# Patient Record
Sex: Female | Born: 1950
Health system: Southern US, Community
[De-identification: ages and names within clinical notes are randomized; demographics above are authoritative.]

## PROBLEM LIST (undated history)

## (undated) DIAGNOSIS — Z9889 Other specified postprocedural states: Secondary | ICD-10-CM

## (undated) DIAGNOSIS — M797 Fibromyalgia: Secondary | ICD-10-CM

## (undated) DIAGNOSIS — M069 Rheumatoid arthritis, unspecified: Secondary | ICD-10-CM

## (undated) DIAGNOSIS — D332 Benign neoplasm of brain, unspecified: Secondary | ICD-10-CM

## (undated) DIAGNOSIS — K859 Acute pancreatitis without necrosis or infection, unspecified: Secondary | ICD-10-CM

## (undated) DIAGNOSIS — A925 Zika virus disease: Secondary | ICD-10-CM

## (undated) DIAGNOSIS — G629 Polyneuropathy, unspecified: Secondary | ICD-10-CM

## (undated) DIAGNOSIS — R112 Nausea with vomiting, unspecified: Secondary | ICD-10-CM

## (undated) HISTORY — DX: Zika virus disease: A92.5

## (undated) HISTORY — PX: EYE SURGERY: SHX253

## (undated) HISTORY — DX: Fibromyalgia: M79.7

## (undated) HISTORY — DX: Acute pancreatitis without necrosis or infection, unspecified: K85.90

## (undated) HISTORY — DX: Benign neoplasm of brain, unspecified: D33.2

## (undated) HISTORY — PX: AUGMENTATION MAMMAPLASTY: SUR837

## (undated) HISTORY — PX: SPLENECTOMY: SUR1306

## (undated) HISTORY — PX: SIMPLE MASTECTOMY: SHX2409

## (undated) HISTORY — DX: Polyneuropathy, unspecified: G62.9

## (undated) HISTORY — PX: MASTECTOMY SUBCUTANEOUS: SUR853

## (undated) HISTORY — PX: TUBAL LIGATION: SHX77

## (undated) HISTORY — PX: OTHER SURGICAL HISTORY: SHX169

---
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
## 1998-08-12 ENCOUNTER — Other Ambulatory Visit: Admission: RE | Admit: 1998-08-12 | Discharge: 1998-08-12 | Payer: Self-pay | Admitting: *Deleted

## 1999-12-03 ENCOUNTER — Other Ambulatory Visit: Admission: RE | Admit: 1999-12-03 | Discharge: 1999-12-03 | Payer: Self-pay | Admitting: *Deleted

## 2001-05-25 ENCOUNTER — Other Ambulatory Visit: Admission: RE | Admit: 2001-05-25 | Discharge: 2001-05-25 | Payer: Self-pay | Admitting: *Deleted

## 2002-08-23 ENCOUNTER — Other Ambulatory Visit: Admission: RE | Admit: 2002-08-23 | Discharge: 2002-08-23 | Payer: Self-pay | Admitting: Obstetrics and Gynecology

## 2003-10-01 ENCOUNTER — Other Ambulatory Visit: Admission: RE | Admit: 2003-10-01 | Discharge: 2003-10-01 | Payer: Self-pay | Admitting: Obstetrics and Gynecology

## 2003-12-26 ENCOUNTER — Ambulatory Visit (HOSPITAL_COMMUNITY): Admission: RE | Admit: 2003-12-26 | Discharge: 2003-12-26 | Payer: Self-pay | Admitting: Pulmonary Disease

## 2004-10-01 ENCOUNTER — Other Ambulatory Visit: Admission: RE | Admit: 2004-10-01 | Discharge: 2004-10-01 | Payer: Self-pay | Admitting: Obstetrics and Gynecology

## 2005-10-05 ENCOUNTER — Other Ambulatory Visit: Admission: RE | Admit: 2005-10-05 | Discharge: 2005-10-05 | Payer: Self-pay | Admitting: Obstetrics & Gynecology

## 2005-12-17 LAB — HM COLONOSCOPY

## 2007-01-06 ENCOUNTER — Other Ambulatory Visit: Admission: RE | Admit: 2007-01-06 | Discharge: 2007-01-06 | Payer: Self-pay | Admitting: Obstetrics & Gynecology

## 2007-09-18 HISTORY — PX: OTHER SURGICAL HISTORY: SHX169

## 2008-02-15 ENCOUNTER — Other Ambulatory Visit: Admission: RE | Admit: 2008-02-15 | Discharge: 2008-02-15 | Payer: Self-pay | Admitting: Obstetrics & Gynecology

## 2008-10-28 ENCOUNTER — Ambulatory Visit (HOSPITAL_COMMUNITY): Admission: RE | Admit: 2008-10-28 | Discharge: 2008-10-28 | Payer: Self-pay | Admitting: Pulmonary Disease

## 2008-11-27 ENCOUNTER — Ambulatory Visit: Payer: Self-pay | Admitting: Vascular Surgery

## 2009-02-27 ENCOUNTER — Ambulatory Visit: Payer: Self-pay | Admitting: Vascular Surgery

## 2010-05-10 ENCOUNTER — Encounter: Payer: Self-pay | Admitting: Pulmonary Disease

## 2010-09-01 NOTE — Consult Note (Signed)
NEW PATIENT CONSULTATION   Whitney Moreno, Whitney Moreno  DOB:  April 14, 1951                                       11/27/2008  CHART#:[id number]   The patient presents today for discussion regarding pain and swelling in  both lower extremities.  She is a very pleasant 60 year old female who  works as a Runner, broadcasting/film/video and reports severe pain in both legs.  This begins in  her distal thighs extending through her calves and down on to her feet.  She reports this is quite painful at the end of the day.  She does not  have any history of deep venous thrombosis.  She does have significant  spider vein telangiectasia over both lower extremities.  She does not  have any varicose veins.   PAST MEDICAL HISTORY:  Negative for any cardiac or major medical  difficulty.  She does not have diabetes, is not hypertensive.   SOCIAL HISTORY:  She is married with two children.  She works as a  Runner, broadcasting/film/video.  She quit smoking greater than 20 years ago.  Does not drink  alcohol.   REVIEW OF SYSTEMS:  Her weight is reported at 170 pounds.  She is 5 feet  7 inches tall.  She denies cardiac, pulmonary, GI or GU difficulties.  She does have arthritic joint pain.   PHYSICAL EXAMINATION:  A well-developed, well-nourished black female  appearing stated age of 34.  Her blood pressure 124/82, heart rate 72,  respirations 18.  Her dorsalis pedis pulses are 2+ bilaterally.  She  does have moderate swelling from her knees distally bilaterally.  Does  not have any skin changes related to venous hypertension.  She does have  telangiectasia on both calves and both thighs.   She underwent noninvasive vascular laboratory studies in our office and  this reveals normal deep system with no evidence of reflux and normal  great and small saphenous veins with no evidence of clot or reflux.  I  discussed this at length with the patient.  I explained that she does  not have any evidence of significant arterial or venous pathology.   I  explained that I am unable to determine the cause of her leg pain.  She  is concerned regarding the appearance of the telangiectasia and reported  successful treatment with sclerotherapy approximately 15 years ago.  I  did explain the option of sclerotherapy for improvement of the  appearance but I explained that I do not feel that we would make any  headway regarding her pain since these do not appear to be related to  the telangiectasia.  She was given information regarding out of pocket  expense and expectations if she proceeds with sclerotherapy and will  notify us if she wishes to proceed.   Larina Earthly, M.D.  Electronically Signed   TFE/MEDQ  D:  11/27/2008  T:  11/28/2008  Job:  [id number]   cc:   Ramon Dredge L. Juanetta Gosling, M.D.

## 2010-09-01 NOTE — Procedures (Signed)
LOWER EXTREMITY VENOUS REFLUX EXAM   INDICATION:  Bilateral lower extremity varicose veins.   EXAM:  Using color-flow imaging and pulse Doppler spectral analysis, the  right and left common femoral, superficial femoral, popliteal, posterior  tibial, greater and lesser saphenous veins are evaluated.  There is no  evidence suggesting deep venous insufficiency in the right or left lower  extremity.   The right and left saphenofemoral junction is competent.  The right and  left GSV is competent with the caliber as described below.   The right and left proximal short saphenous veins demonstrate  competency.   GSV Diameter (used if found to be incompetent only)                                            Right    Left  Proximal Greater Saphenous Vein           cm       cm  Proximal-to-mid-thigh                     cm       cm  Mid thigh                                 cm       cm  Mid-distal thigh                          cm       cm  Distal thigh                              cm       cm  Knee                                      cm       cm   IMPRESSION:  1. The right and left greater saphenous veins demonstrate competency.  2. The right and left greater saphenous veins are not aneurysmal.  3. The right and left greater saphenous veins are not tortuous.  4. The deep venous system is competent.  5. The right and left lesser saphenous veins are competent.  6. There is no evidence of deep or superficial venous thrombosis.   ___________________________________________  Larina Earthly, M.D.   AC/MEDQ  D:  11/27/2008  T:  11/27/2008  Job:  [id number]

## 2010-11-02 ENCOUNTER — Ambulatory Visit (HOSPITAL_COMMUNITY)
Admission: RE | Admit: 2010-11-02 | Discharge: 2010-11-02 | Disposition: A | Discharge status: Home / Self Care | Payer: BC Managed Care – PPO | Source: Ambulatory Visit | Attending: Pulmonary Disease | Admitting: Pulmonary Disease

## 2010-11-02 ENCOUNTER — Other Ambulatory Visit (HOSPITAL_COMMUNITY): Payer: Self-pay | Admitting: Pulmonary Disease

## 2010-11-02 DIAGNOSIS — R52 Pain, unspecified: Secondary | ICD-10-CM

## 2010-11-02 DIAGNOSIS — M25569 Pain in unspecified knee: Secondary | ICD-10-CM | POA: Insufficient documentation

## 2010-11-26 ENCOUNTER — Ambulatory Visit (INDEPENDENT_AMBULATORY_CARE_PROVIDER_SITE_OTHER): Payer: BC Managed Care – PPO | Admitting: Orthopedic Surgery

## 2010-11-26 ENCOUNTER — Encounter: Payer: Self-pay | Admitting: Orthopedic Surgery

## 2010-11-26 VITALS — Resp 18 | Ht 67.0 in | Wt 178.0 lb

## 2010-11-26 DIAGNOSIS — M1711 Unilateral primary osteoarthritis, right knee: Secondary | ICD-10-CM | POA: Insufficient documentation

## 2010-11-26 DIAGNOSIS — IMO0002 Reserved for concepts with insufficient information to code with codable children: Secondary | ICD-10-CM

## 2010-11-26 DIAGNOSIS — M1710 Unilateral primary osteoarthritis, unspecified knee: Secondary | ICD-10-CM

## 2010-11-26 MED ORDER — DICLOFENAC POTASSIUM 50 MG PO TABS: 50.0000 mg | ORAL_TABLET | Freq: Two times a day (BID) | ORAL | Status: DC

## 2010-11-26 NOTE — Progress Notes (Signed)
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  The pain started gradually she's only been treated with over-the-counter medications Tylenol and ibuprofen but continues with throbbing and aching knee pain which is worse after climbing and after being active.  There are times when the knees feel tight she can't bend her meds are for her to walk  Visit history of fibromyalgia.  She's had eye surgery and a full mastectomy.  She reports a review of systems upper extremity RIGHT hand tingling no shortness of breath chest pain heartburn her   Physical Exam(12) GENERAL: normal development   CDV: pulses are normal   Skin: normal  Lymph: nodes were not palpable/normal  Psychiatric: awake, alert and oriented  Neuro: normal sensation  MSK She ambulates normally without assistive device.  Bilateral knees are examined.  Inspection normal leg lengths.  Medial and lateral joint line tenderness and patellofemoral crepitance. Range of motion is normal up to 115 and then no more motion is obtainable. Manual muscle testing strength is 5/5. Knee is stable bilaterally. McMurray sign is negative.  Hip range of motion is normal without pain  Assessment: Osteoarthritis both knees with primarily patellofemoral joint symptoms.  Plan: Recommend anti-inflammatories, weight loss, physical therapy for strengthening and anti-inflammatories.  Followup 2 months  X-rays have been reviewed, they were done at [facility] and the reports are included with the conclusion of osteoarthritis bilateral knees

## 2010-11-26 NOTE — Patient Instructions (Signed)
Home exericise program   Weight loss  Start arthritis medication

## 2010-11-27 ENCOUNTER — Ambulatory Visit (HOSPITAL_COMMUNITY)
Admission: RE | Admit: 2010-11-27 | Discharge: 2010-11-27 | Disposition: A | Discharge status: Home / Self Care | Payer: BC Managed Care – PPO | Source: Ambulatory Visit | Attending: Orthopedic Surgery | Admitting: Orthopedic Surgery

## 2010-11-27 DIAGNOSIS — M25669 Stiffness of unspecified knee, not elsewhere classified: Secondary | ICD-10-CM | POA: Insufficient documentation

## 2010-11-27 DIAGNOSIS — M25569 Pain in unspecified knee: Secondary | ICD-10-CM | POA: Insufficient documentation

## 2010-11-27 DIAGNOSIS — IMO0001 Reserved for inherently not codable concepts without codable children: Secondary | ICD-10-CM | POA: Insufficient documentation

## 2010-11-27 DIAGNOSIS — M6281 Muscle weakness (generalized): Secondary | ICD-10-CM | POA: Insufficient documentation

## 2010-11-27 NOTE — Patient Instructions (Addendum)
HEP

## 2010-11-27 NOTE — Progress Notes (Signed)
Physical Therapy Evaluation  Patient Name: KAMEKO HUKILL [id number] Date: 11/27/2010 HPI: B arthritis with decreased ROM and strength. Symptoms/Limitations Symptoms: Pt states that she has had arthritis for awhile but she has noticed increased pain over the past summer.  Pt states that if she is going up or down steps she has significant increased pain.  She states that she is having to lift her legs up with her hands getting in and out of the car about 30% of the time.  The patient states if she is sitting in a low chair she has significant pain coming from sit to stand.  The patient statees if she is weight-bearing more than 15 minutes she has increased pain.  She states she has noticed increased swelling.  The patient has used over the counter pain meds which gives her relief but the pain comes right back. Limitations: Sitting;Standing;Walking;House hold activities How long can you sit comfortably?: If pt sits down for 30 minutes it is difficult for her to stand up.  The longer she sits the more difficult it is to get up. How long can you stand comfortably?: Pain starts after 15 minutes and progresses.  The pt is a Runner, broadcasting/film/video and just continues on. How long can you walk comfortably?: as above. Pain Assessment Currently in Pain?: Yes Pain Score: 10-Worst pain ever (at the worst; least 0 ) Pain Location: Knee Pain Orientation: Left Pain Type: Chronic pain Pain Onset: More than a month ago Pain Frequency: Intermittent Pain Relieving Factors: over the counter pain meds Multiple Pain Sites: Yes Past Medical History:  Past Medical History  Diagnosis Date  . Fibromyalgia    Past Surgical History:  Past Surgical History  Procedure Date  . Eye surgery   . Full mastectomy     Precautions/Restrictions     Prior Functioning  Home Living Type of Home: House Lives With: Spouse Home Layout: Two level Alternate Level Stairs-Rails: Right Alternate Level Stairs-Number of Steps: 12 Home Access:  Stairs to enter Entrance Stairs-Rails: Right Entrance Stairs-Number of Steps: 3 Prior Function Level of Independence: Independent with basic ADLs Driving: Yes Able to Take Stairs Reciprically: Yes Vocation: Full time employment Printmaker) Leisure: Hobbies-yes (Comment) Comments: yard  Cognition Cognition Overall Cognitive Status: Appears within functional limits for tasks assessed  Sensation/Coordination/Flexibility  Tight hamstrings B  Assessment RLE AROM (degrees) Right Knee Extension 0-130: 3  Right Knee Flexion 0-140: 120  RLE Strength Right Hip Flexion: 5/5 Right Hip Extension: 3/5 (5-) Right Hip ABduction: 5/5 (5-) Right Hip ADduction: 5/5 Right Knee Flexion:  (5-) Right Knee Extension: 5/5 LLE AROM (degrees) Left Knee Extension 0-130: 4  Left Knee Flexion 0-140: 112  LLE Strength Left Hip Flexion: 4/5 Left Hip Extension: 3/5 Left Hip ABduction: 4/5 Left Hip ADduction:  (4-) Left Knee Flexion: 5/5 Left Knee Extension: 4/5  Mobility (including Balance)     Exercise/Treatments Knee Stretches Active Hamstring Stretch: 3 reps;30 seconds Knee Exercises Quad Sets: 10 reps Knee Flexion: Left;10 reps (prone) Straight Leg Raises: 10 reps Heel Slides: 10 reps;Supine Hip Extension: Left (prone) Hip ABduction: 10 reps;Sidelying Hip ADduction: 10 reps;Sidelying Additional Knee Exercises Lateral Step Up: Step Height: 4";10 reps Functional Squat: 10 reps SLS: 2x    Goals PT Short Term Goals Short Term Goal 1: I HEP 1 treatment only. End of Session Patient Active Problem List  Diagnoses  . OA (osteoarthritis) of knee  . Muscle weakness (generalized)  . Stiffness of joint, not elsewhere classified, lower leg  PT - End of Session Activity Tolerance: Patient tolerated treatment well General Behavior During Session: [facility] for tasks performed Cognition: [facility] for tasks performed PT Assessment and Plan Clinical Impression Statement: Pt with decreased ROM and  strength who benefitted from a one time HEP  PT Treatment/Interventions: Therapeutic exercise PT Plan: D/C pt to call if she has any questions on her HEP.  Time Calculation Start Time: 1610 Stop Time: 0954 Time Calculation (min): 62 min  RUSSELL,CINDY 11/27/2010, 10:32 AM

## 2010-12-10 ENCOUNTER — Telehealth: Payer: Self-pay | Admitting: Orthopedic Surgery

## 2010-12-10 DIAGNOSIS — M1710 Unilateral primary osteoarthritis, unspecified knee: Secondary | ICD-10-CM

## 2010-12-10 MED ORDER — DICLOFENAC POTASSIUM 50 MG PO TABS: 50.0000 mg | ORAL_TABLET | Freq: Two times a day (BID) | ORAL | Status: AC

## 2010-12-10 NOTE — Telephone Encounter (Signed)
refaxed medication.  

## 2010-12-10 NOTE — Telephone Encounter (Signed)
Please fax the prescription from last office visit to [facility] in [place]

## 2011-08-09 LAB — HM PAP SMEAR: HM Pap smear: NEGATIVE

## 2012-04-20 LAB — HM MAMMOGRAPHY: HM Mammogram: NORMAL

## 2012-09-06 ENCOUNTER — Ambulatory Visit: Payer: Self-pay | Admitting: Obstetrics & Gynecology

## 2012-09-07 ENCOUNTER — Encounter: Payer: Self-pay | Admitting: Obstetrics & Gynecology

## 2012-09-08 ENCOUNTER — Ambulatory Visit: Payer: Self-pay | Admitting: Obstetrics & Gynecology

## 2012-09-08 ENCOUNTER — Encounter: Payer: Self-pay | Admitting: Obstetrics & Gynecology

## 2012-09-08 DIAGNOSIS — Z01419 Encounter for gynecological examination (general) (routine) without abnormal findings: Secondary | ICD-10-CM

## 2012-09-21 ENCOUNTER — Encounter (HOSPITAL_COMMUNITY): Payer: Self-pay | Admitting: Emergency Medicine

## 2012-09-21 ENCOUNTER — Emergency Department (HOSPITAL_COMMUNITY): Payer: BC Managed Care – PPO

## 2012-09-21 ENCOUNTER — Emergency Department (HOSPITAL_COMMUNITY)
Admission: EM | Admit: 2012-09-21 | Discharge: 2012-09-21 | Disposition: A | Discharge status: Home / Self Care | Payer: BC Managed Care – PPO | Attending: Emergency Medicine | Admitting: Emergency Medicine

## 2012-09-21 DIAGNOSIS — M1710 Unilateral primary osteoarthritis, unspecified knee: Secondary | ICD-10-CM | POA: Insufficient documentation

## 2012-09-21 DIAGNOSIS — Z8669 Personal history of other diseases of the nervous system and sense organs: Secondary | ICD-10-CM | POA: Insufficient documentation

## 2012-09-21 DIAGNOSIS — IMO0001 Reserved for inherently not codable concepts without codable children: Secondary | ICD-10-CM | POA: Insufficient documentation

## 2012-09-21 DIAGNOSIS — M1712 Unilateral primary osteoarthritis, left knee: Secondary | ICD-10-CM

## 2012-09-21 DIAGNOSIS — M069 Rheumatoid arthritis, unspecified: Secondary | ICD-10-CM | POA: Insufficient documentation

## 2012-09-21 DIAGNOSIS — IMO0002 Reserved for concepts with insufficient information to code with codable children: Secondary | ICD-10-CM | POA: Insufficient documentation

## 2012-09-21 DIAGNOSIS — Z79899 Other long term (current) drug therapy: Secondary | ICD-10-CM | POA: Insufficient documentation

## 2012-09-21 DIAGNOSIS — Z87891 Personal history of nicotine dependence: Secondary | ICD-10-CM | POA: Insufficient documentation

## 2012-09-21 HISTORY — DX: Rheumatoid arthritis, unspecified: M06.9

## 2012-09-21 MED ORDER — HYDROCODONE-ACETAMINOPHEN 5-325 MG PO TABS: 1.0000 | ORAL_TABLET | ORAL | Status: DC | PRN

## 2012-09-21 NOTE — ED Notes (Signed)
Patient with no complaints at this time. Respirations even and unlabored. Skin warm/dry. Discharge instructions reviewed with patient at this time. Patient given opportunity to voice concerns/ask questions. Patient discharged at this time and left Emergency Department with steady gait.   

## 2012-09-21 NOTE — ED Notes (Signed)
Patient c/o left knee pain x3-4 days. Per p[atient no known injury. Per patient has had this type of pain in 2 years ago which required physical therapy. Patient reports taking 2 extra strength advil with no relief.

## 2012-09-23 NOTE — ED Provider Notes (Signed)
History     CSN: [id number]  Arrival date & time 09/21/12  0800   First MD Initiated Contact with Patient 09/21/12 [phone]      Chief Complaint  Patient presents with  . Knee Pain    (Consider location/radiation/quality/duration/timing/severity/associated sxs/prior treatment) HPI Comments: Whitney Moreno is a 62 y.o. Female presenting with a four-day history of  left knee pain which is constant, aching in quality and worsened with range of motion and weightbearing.  She has a history of previous intermittent problems with this knee, most recently requiring physical therapy about 2 years ago which did relieve her symptoms.  She does have a history of rheumatoid arthritis for which she takes methotrexate.  She denies injury to her knee and denies redness, rash, increased swelling of the joint although reports chronic mild swelling at baseline which is unchanged.  She has taken Advil with no relief of symptoms.  The history is provided by the patient.    Past Medical History  Diagnosis Date  . Fibromyalgia   . Brain tumor (benign)   . RA (rheumatoid arthritis)     Past Surgical History  Procedure Laterality Date  . Eye surgery      with prosthetic eye  . Splenectomy    . Mastectomy, partial    . Tubal ligation      Family History  Problem Relation Age of Onset  . Heart disease    . Cancer      History  Substance Use Topics  . Smoking status: Former Smoker    Types: Cigarettes  . Smokeless tobacco: Never Used  . Alcohol Use: Yes     Comment: socially    OB History   Grav Para Term Preterm Abortions TAB SAB Ect Mult Living   2 2 2       2       Review of Systems  Constitutional: Negative for fever.  Musculoskeletal: Positive for arthralgias. Negative for myalgias and joint swelling.  Neurological: Negative for weakness and numbness.    Allergies  Review of patient's allergies indicates no known allergies.  Home Medications   Current Outpatient Rx  Name   Route  Sig  Dispense  Refill  . calcium carbonate 200 MG capsule   Oral   Take 250 mg by mouth daily.           . CYMBALTA 60 MG capsule               . glucosamine-chondroitin 500-400 MG tablet   Oral   Take 1 tablet by mouth 3 (three) times daily.           . Ibuprofen (ADVIL PO)   Oral   Take 2 tablets by mouth daily as needed (pain).         . methotrexate (RHEUMATREX) 2.5 MG tablet   Oral   Take 10 mg by mouth once a week. Caution:Chemotherapy. Protect from light.         . Multiple Vitamin (MULTIVITAMIN PO)   Oral   Take by mouth.           Marland Kitchen HYDROcodone-acetaminophen (NORCO/VICODIN) 5-325 MG per tablet   Oral   Take 1 tablet by mouth every 4 (four) hours as needed for pain.   15 tablet   0     BP 112/75  Pulse 76  Temp(Src) 98.1 F (36.7 C) (Oral)  Resp 16  Ht 5\' 6"  (1.676 m)  Wt 180 lb (81.647 kg)  BMI  29.07 kg/m2  SpO2 96%  Physical Exam  Constitutional: She appears well-developed and well-nourished.  HENT:  Head: Atraumatic.  Neck: Normal range of motion.  Cardiovascular:  Pulses equal bilaterally  Musculoskeletal: She exhibits tenderness.       Left knee: She exhibits swelling. She exhibits normal range of motion, no deformity, no erythema, normal alignment and normal meniscus. Tenderness found. Medial joint line and lateral joint line tenderness noted.  Mild crepitus with range of motion.  No rash, lesions or ecchymosis.  Her dorsalis pedis pulses are intact.  Patient displays ankle flexion and extension without pain.  Neurological: She is alert. She has normal strength. She displays normal reflexes. No sensory deficit.  Equal strength  Skin: Skin is warm and dry.  Psychiatric: She has a normal mood and affect.    ED Course  Procedures (including critical care time)  Labs Reviewed - No data to display No results found.   1. Osteoarthritis of left knee       MDM  Patient was prescribed hydrocodone.  Encouraged rest,  minimizing weight bearing, heating pad.  Discussed x-ray findings with patient and encouraged followup with Dr. Romeo Apple as soon as possible for further management of her pain.  Discussed hurts severe arthritis changes, and possible need for surgical intervention at some point in time, which she states Dr. Romeo Apple has mentioned to her previously.  The patient appears reasonably screened and/or stabilized for discharge and I doubt any other medical condition or other [facility] requiring further screening, evaluation, or treatment in the ED at this time prior to discharge.         Burgess Amor, PA-C 09/23/12 1550

## 2012-09-26 NOTE — ED Provider Notes (Signed)
Medical screening examination/treatment/procedure(s) were performed by non-physician practitioner and as supervising physician I was immediately available for consultation/collaboration. Devoria Albe, MD, Armando Gang   Ward Givens, MD 09/26/12 [phone]

## 2012-09-28 ENCOUNTER — Ambulatory Visit: Payer: Self-pay | Admitting: Obstetrics & Gynecology

## 2012-10-04 ENCOUNTER — Encounter: Payer: Self-pay | Admitting: *Deleted

## 2012-10-09 ENCOUNTER — Ambulatory Visit: Payer: BC Managed Care – PPO | Admitting: Orthopedic Surgery

## 2012-10-10 ENCOUNTER — Ambulatory Visit (INDEPENDENT_AMBULATORY_CARE_PROVIDER_SITE_OTHER): Payer: BC Managed Care – PPO | Admitting: Obstetrics & Gynecology

## 2012-10-10 ENCOUNTER — Encounter: Payer: Self-pay | Admitting: Obstetrics & Gynecology

## 2012-10-10 VITALS — BP 104/64 | HR 70 | Resp 16 | Ht 66.5 in | Wt 179.0 lb

## 2012-10-10 DIAGNOSIS — Z01419 Encounter for gynecological examination (general) (routine) without abnormal findings: Secondary | ICD-10-CM

## 2012-10-10 DIAGNOSIS — Z0000 Encounter for general adult medical examination without abnormal findings: Secondary | ICD-10-CM

## 2012-10-10 LAB — POCT URINALYSIS DIPSTICK
Bilirubin, UA: NEGATIVE
Blood, UA: NEGATIVE
Glucose, UA: NEGATIVE
Ketones, UA: NEGATIVE
Leukocytes, UA: NEGATIVE
Nitrite, UA: NEGATIVE
Protein, UA: NEGATIVE
pH, UA: 6

## 2012-10-10 LAB — LIPID PANEL
Cholesterol: 238 mg/dL — ABNORMAL HIGH (ref 0–200)
HDL: 68 mg/dL (ref >39)
LDL Cholesterol: 142 mg/dL — ABNORMAL HIGH (ref 0–99)
Total CHOL/HDL Ratio: 3.5 Ratio
Triglycerides: 140 mg/dL (ref <150)
VLDL: 28 mg/dL (ref 0–40)

## 2012-10-10 LAB — HEMOGLOBIN, FINGERSTICK: Hemoglobin, fingerstick: 12.7 g/dL (ref 12.0–16.0)

## 2012-10-10 NOTE — Patient Instructions (Signed)

## 2012-10-10 NOTE — Progress Notes (Signed)
62 y.o. [id number] MarriedHispanicF here for annual exam.  Having a lot more trouble with RA this year especially in her knees.  Had a cortisone injection yesterday.  Her rheumatologist has been adjusting her medications.  She is going to see her mother and stay for the summer.  Family is in [place].  Husband diagnosed with myasthenia gravis this past year.   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  She thinks she can make it until then.    No LMP recorded. Patient is postmenopausal.          Sexually active: no  The current method of family planning is none.    Exercising: no  The patient does not participate in regular exercise at present. Smoker:  no  Health Maintenance: Pap:  08/09/11 normal, neg HPV History of abnormal Pap:  no MMG:  04/20/12 Colonoscopy:  8/07 normal 10 year recheck BMD:   08/07, 1.6/-0.4 TDaP:  Unsure  Screening Labs: yesterday, Hb today: 12.7, Urine today: normal    reports that she has quit smoking. Her smoking use included Cigarettes. She smoked 0.00 packs per day. She has never used smokeless tobacco. She reports that  drinks alcohol. She reports that she does not use illicit drugs.  Past Medical History  Diagnosis Date  . Fibromyalgia   . Brain tumor (benign)   . RA (rheumatoid arthritis)     Past Surgical History  Procedure Laterality Date  . Eye surgery      with prosthetic eye  . Splenectomy    . Mastectomy, partial    . Tubal ligation      Current Outpatient Prescriptions  Medication Sig Dispense Refill  . calcium carbonate 200 MG capsule Take 250 mg by mouth daily.        . CYMBALTA 60 MG capsule       . folic acid (FOLVITE) 1 MG tablet       . glucosamine-chondroitin 500-400 MG tablet Take 1 tablet by mouth 3 (three) times daily.        Marland Kitchen HYDROcodone-acetaminophen (NORCO/VICODIN) 5-325 MG per tablet Take 1 tablet by mouth every 4 (four) hours as needed for pain.  15 tablet  0  . Ibuprofen (ADVIL PO) Take 2 tablets  by mouth daily as needed (pain).      . methotrexate (RHEUMATREX) 2.5 MG tablet Take 10 mg by mouth once a week. Caution:Chemotherapy. Protect from light.      . Multiple Vitamin (MULTIVITAMIN PO) Take by mouth.         No current facility-administered medications for this visit.    Family History  Problem Relation Age of Onset  . Heart disease    . Cancer    . Osteoarthritis Mother   . Heart disease Father   . Hypertension Father   . Heart failure Maternal Uncle   . Heart failure Cousin     ROS:  Pertinent items are noted in HPI.  Otherwise, a comprehensive ROS was negative.  Exam:   BP 104/64  Pulse 70  Resp 16  Ht 5' 6.5" (1.689 m)  Wt 179 lb (81.194 kg)  BMI 28.46 kg/m2  Weight change: -2lbs  Height: 5' 6.5" (168.9 cm)  Ht Readings from Last 3 Encounters:  10/10/12 5' 6.5" (1.689 m)  09/21/12 5\' 6"  (1.676 m)  11/26/10 5\' 7"  (1.702 m)    General appearance: alert, cooperative and appears stated age Head: Normocephalic, without obvious abnormality, atraumatic Neck: no  adenopathy, supple, symmetrical, trachea midline and thyroid normal to inspection and palpation Lungs: clear to auscultation bilaterally Breasts: normal appearance, no masses or tenderness, bilateral implants present Heart: regular rate and rhythm Abdomen: soft, non-tender; bowel sounds normal; no masses,  no organomegaly Extremities: extremities normal, atraumatic, no cyanosis or edema Skin: Skin color, texture, turgor normal. No rashes or lesions Lymph nodes: Cervical, supraclavicular, and axillary nodes normal. No abnormal inguinal nodes palpated Neurologic: Grossly normal   Pelvic: External genitalia:  no lesions              Urethra:  normal appearing urethra with no masses, tenderness or lesions              Bartholins and Skenes: normal                 Vagina: normal appearing vagina with normal color and discharge, no lesions              Cervix: no lesions              Pap taken:  no Bimanual Exam:  Uterus:  normal size, contour, position, consistency, mobility, non-tender              Adnexa: normal adnexa and no mass, fullness, tenderness               Rectovaginal: Confirms               Anus:  normal sphincter tone, no lesions  A:  Well Woman with normal exam PMP, no HRT Fibromyalgia RA  P:   Mammogram yearly.  pap smear with HR HPV 1 year ago.  No pap today. Lipids today. return annually or prn  An After Visit Summary was printed and given to the patient.

## 2012-10-16 ENCOUNTER — Telehealth: Payer: Self-pay

## 2012-10-16 NOTE — Telephone Encounter (Signed)
Message copied by Elisha Headland on Mon Oct 16, 2012  1:05 PM ------      Message from: Jerene Bears      Created: Fri Oct 13, 2012  4:29 PM       Inform lipids stable.  LDLs elevated but HDLs good.  TG's good.  Repeat 1-2 yrs ------

## 2012-10-16 NOTE — Telephone Encounter (Signed)
6/30 lmtcb//kn

## 2013-02-22 ENCOUNTER — Other Ambulatory Visit: Payer: Self-pay

## 2013-03-22 ENCOUNTER — Telehealth: Payer: Self-pay | Admitting: Obstetrics & Gynecology

## 2013-03-22 NOTE — Telephone Encounter (Signed)
Routing to Kelly.  ?

## 2013-03-22 NOTE — Telephone Encounter (Signed)
Returning a call to Kelly °

## 2013-03-22 NOTE — Telephone Encounter (Signed)
Pt says she is returning kelly's call. No phone call in system

## 2013-03-23 NOTE — Telephone Encounter (Signed)
Message left to return call to Secretary at [phone].   I can give lab results when patient returns call today.

## 2013-03-23 NOTE — Telephone Encounter (Signed)
Returning call. Pt is aware kelly is not in today.

## 2013-03-23 NOTE — Telephone Encounter (Signed)
Spoke with patinet. Message given.

## 2013-03-30 NOTE — Progress Notes (Signed)
Quick Note:  Patient notified by T. Fast, RN. ______

## 2013-03-30 NOTE — Telephone Encounter (Signed)
Patient notified of lab results by T Fast, RN

## 2013-05-09 ENCOUNTER — Encounter (HOSPITAL_COMMUNITY): Payer: Self-pay | Admitting: Emergency Medicine

## 2013-05-09 ENCOUNTER — Emergency Department (HOSPITAL_COMMUNITY)
Admission: EM | Admit: 2013-05-09 | Discharge: 2013-05-09 | Discharge status: Against Medical Advice | Payer: BC Managed Care – PPO | Attending: Emergency Medicine | Admitting: Emergency Medicine

## 2013-05-09 DIAGNOSIS — Y929 Unspecified place or not applicable: Secondary | ICD-10-CM | POA: Insufficient documentation

## 2013-05-09 DIAGNOSIS — S0993XA Unspecified injury of face, initial encounter: Secondary | ICD-10-CM | POA: Insufficient documentation

## 2013-05-09 DIAGNOSIS — Y939 Activity, unspecified: Secondary | ICD-10-CM | POA: Insufficient documentation

## 2013-05-09 DIAGNOSIS — W2209XA Striking against other stationary object, initial encounter: Secondary | ICD-10-CM | POA: Insufficient documentation

## 2013-05-09 DIAGNOSIS — S199XXA Unspecified injury of neck, initial encounter: Principal | ICD-10-CM

## 2013-05-09 NOTE — ED Notes (Signed)
Patient c/o nosebleed x 20 minutes.  Patient hit her face on sliding on glass door.

## 2013-05-09 NOTE — ED Notes (Signed)
Pt 's husband asking how longer it was going to be, delay explained to husband, staff informed that pt and husband left without being seen

## 2013-06-06 ENCOUNTER — Ambulatory Visit (HOSPITAL_COMMUNITY)
Admission: RE | Admit: 2013-06-06 | Discharge: 2013-06-06 | Disposition: A | Discharge status: Home / Self Care | Payer: BC Managed Care – PPO | Source: Ambulatory Visit | Attending: Pulmonary Disease | Admitting: Pulmonary Disease

## 2013-06-06 ENCOUNTER — Other Ambulatory Visit (HOSPITAL_COMMUNITY): Payer: Self-pay | Admitting: Pulmonary Disease

## 2013-06-06 DIAGNOSIS — Z87891 Personal history of nicotine dependence: Secondary | ICD-10-CM | POA: Insufficient documentation

## 2013-06-06 DIAGNOSIS — M25519 Pain in unspecified shoulder: Secondary | ICD-10-CM | POA: Insufficient documentation

## 2013-06-06 DIAGNOSIS — M25511 Pain in right shoulder: Secondary | ICD-10-CM

## 2013-06-06 DIAGNOSIS — R079 Chest pain, unspecified: Secondary | ICD-10-CM

## 2013-08-02 DIAGNOSIS — M797 Fibromyalgia: Secondary | ICD-10-CM | POA: Insufficient documentation

## 2013-08-02 DIAGNOSIS — M791 Myalgia, unspecified site: Secondary | ICD-10-CM | POA: Insufficient documentation

## 2013-08-02 DIAGNOSIS — M069 Rheumatoid arthritis, unspecified: Secondary | ICD-10-CM | POA: Insufficient documentation

## 2013-08-12 ENCOUNTER — Inpatient Hospital Stay (HOSPITAL_COMMUNITY): Payer: BC Managed Care – PPO

## 2013-08-12 ENCOUNTER — Inpatient Hospital Stay (HOSPITAL_COMMUNITY)
Admission: EM | Admit: 2013-08-12 | Discharge: 2013-08-14 | DRG: 419 | Disposition: A | Discharge status: Home / Self Care | Payer: BC Managed Care – PPO | Attending: Pulmonary Disease | Admitting: Pulmonary Disease

## 2013-08-12 ENCOUNTER — Emergency Department (HOSPITAL_COMMUNITY): Payer: BC Managed Care – PPO

## 2013-08-12 ENCOUNTER — Encounter (HOSPITAL_COMMUNITY): Payer: Self-pay | Admitting: Emergency Medicine

## 2013-08-12 DIAGNOSIS — K8000 Calculus of gallbladder with acute cholecystitis without obstruction: Principal | ICD-10-CM | POA: Diagnosis present

## 2013-08-12 DIAGNOSIS — IMO0001 Reserved for inherently not codable concepts without codable children: Secondary | ICD-10-CM | POA: Diagnosis present

## 2013-08-12 DIAGNOSIS — M1710 Unilateral primary osteoarthritis, unspecified knee: Secondary | ICD-10-CM | POA: Diagnosis present

## 2013-08-12 DIAGNOSIS — R7989 Other specified abnormal findings of blood chemistry: Secondary | ICD-10-CM

## 2013-08-12 DIAGNOSIS — Z970 Presence of artificial eye: Secondary | ICD-10-CM

## 2013-08-12 DIAGNOSIS — K810 Acute cholecystitis: Secondary | ICD-10-CM | POA: Diagnosis present

## 2013-08-12 DIAGNOSIS — Z87891 Personal history of nicotine dependence: Secondary | ICD-10-CM

## 2013-08-12 DIAGNOSIS — M069 Rheumatoid arthritis, unspecified: Secondary | ICD-10-CM | POA: Diagnosis present

## 2013-08-12 DIAGNOSIS — M179 Osteoarthritis of knee, unspecified: Secondary | ICD-10-CM | POA: Diagnosis present

## 2013-08-12 DIAGNOSIS — Z8249 Family history of ischemic heart disease and other diseases of the circulatory system: Secondary | ICD-10-CM

## 2013-08-12 DIAGNOSIS — M1711 Unilateral primary osteoarthritis, right knee: Secondary | ICD-10-CM | POA: Diagnosis present

## 2013-08-12 DIAGNOSIS — K819 Cholecystitis, unspecified: Secondary | ICD-10-CM | POA: Diagnosis present

## 2013-08-12 DIAGNOSIS — R945 Abnormal results of liver function studies: Secondary | ICD-10-CM

## 2013-08-12 DIAGNOSIS — K5900 Constipation, unspecified: Secondary | ICD-10-CM

## 2013-08-12 LAB — URINALYSIS, ROUTINE W REFLEX MICROSCOPIC
Bilirubin Urine: NEGATIVE
Glucose, UA: NEGATIVE mg/dL
Hgb urine dipstick: NEGATIVE
Ketones, ur: NEGATIVE mg/dL
Leukocytes, UA: NEGATIVE
Nitrite: NEGATIVE
Protein, ur: NEGATIVE mg/dL
Specific Gravity, Urine: 1.015 (ref 1.005–1.030)
Urobilinogen, UA: 4 mg/dL — ABNORMAL HIGH (ref 0.0–1.0)
pH: 7 (ref 5.0–8.0)

## 2013-08-12 LAB — CBC WITH DIFFERENTIAL/PLATELET
Basophils Absolute: 0 10*3/uL (ref 0.0–0.1)
Basophils Relative: 0 % (ref 0–1)
Eosinophils Absolute: 0.3 10*3/uL (ref 0.0–0.7)
Eosinophils Relative: 3 % (ref 0–5)
HCT: 35.6 % — ABNORMAL LOW (ref 36.0–46.0)
Hemoglobin: 11.8 g/dL — ABNORMAL LOW (ref 12.0–15.0)
Lymphocytes Relative: 20 % (ref 12–46)
Lymphs Abs: 1.6 10*3/uL (ref 0.7–4.0)
MCH: 29.6 pg (ref 26.0–34.0)
MCHC: 33.1 g/dL (ref 30.0–36.0)
MCV: 89.4 fL (ref 78.0–100.0)
Monocytes Absolute: 0.9 10*3/uL (ref 0.1–1.0)
Monocytes Relative: 11 % (ref 3–12)
Neutro Abs: 5.2 10*3/uL (ref 1.7–7.7)
Neutrophils Relative %: 66 % (ref 43–77)
Platelets: 335 10*3/uL (ref 150–400)
RBC: 3.98 MIL/uL (ref 3.87–5.11)
RDW: 14.8 % (ref 11.5–15.5)
WBC: 7.9 10*3/uL (ref 4.0–10.5)

## 2013-08-12 LAB — COMPREHENSIVE METABOLIC PANEL
ALT: 560 U/L — ABNORMAL HIGH (ref 0–35)
AST: 812 U/L — ABNORMAL HIGH (ref 0–37)
Albumin: 3.5 g/dL (ref 3.5–5.2)
Alkaline Phosphatase: 131 U/L — ABNORMAL HIGH (ref 39–117)
BUN: 13 mg/dL (ref 6–23)
CO2: 26 mEq/L (ref 19–32)
Calcium: 9.2 mg/dL (ref 8.4–10.5)
Chloride: 103 mEq/L (ref 96–112)
Creatinine, Ser: 0.57 mg/dL (ref 0.50–1.10)
GFR calc Af Amer: >90 mL/min (ref >90)
GFR calc non Af Amer: >90 mL/min (ref >90)
Glucose, Bld: 118 mg/dL — ABNORMAL HIGH (ref 70–99)
Potassium: 4.1 mEq/L (ref 3.7–5.3)
Sodium: 141 mEq/L (ref 137–147)
Total Bilirubin: 1.2 mg/dL (ref 0.3–1.2)
Total Protein: 7.1 g/dL (ref 6.0–8.3)

## 2013-08-12 LAB — LIPASE, BLOOD: Lipase: 30 U/L (ref 11–59)

## 2013-08-12 LAB — SURGICAL PCR SCREEN
MRSA, PCR: NEGATIVE
Staphylococcus aureus: NEGATIVE

## 2013-08-12 MED ORDER — SODIUM CHLORIDE 0.9 % IV SOLN
1.5000 g | Freq: Four times a day (QID) | INTRAVENOUS | Status: DC
  Administered 2013-08-12 – 2013-08-14 (×8): 1.5 g via INTRAVENOUS
  Filled 2013-08-12 (×10): qty 1.5

## 2013-08-12 MED ORDER — SODIUM CHLORIDE 0.9 % IV SOLN: INTRAVENOUS | Status: DC

## 2013-08-12 MED ORDER — DOCUSATE SODIUM 100 MG PO CAPS
100.0000 mg | ORAL_CAPSULE | Freq: Two times a day (BID) | ORAL | Status: DC
  Administered 2013-08-12 – 2013-08-13 (×3): 100 mg via ORAL
  Filled 2013-08-12 (×3): qty 1

## 2013-08-12 MED ORDER — SORBITOL 70 % SOLN
30.0000 mL | Status: AC
  Administered 2013-08-12: 30 mL via ORAL
  Filled 2013-08-12: qty 30

## 2013-08-12 MED ORDER — IOHEXOL 300 MG/ML  SOLN
50.0000 mL | Freq: Once | INTRAMUSCULAR | Status: AC | PRN
  Administered 2013-08-12: 50 mL via ORAL

## 2013-08-12 MED ORDER — PIPERACILLIN-TAZOBACTAM 3.375 G IVPB 30 MIN
3.3750 g | Freq: Once | INTRAVENOUS | Status: AC
  Administered 2013-08-12: 3.375 g via INTRAVENOUS
  Filled 2013-08-12: qty 50

## 2013-08-12 MED ORDER — MORPHINE SULFATE 4 MG/ML IJ SOLN
4.0000 mg | Freq: Once | INTRAMUSCULAR | Status: AC
  Administered 2013-08-12: 4 mg via INTRAVENOUS
  Filled 2013-08-12: qty 1

## 2013-08-12 MED ORDER — MORPHINE SULFATE 2 MG/ML IJ SOLN: 2.0000 mg | INTRAMUSCULAR | Status: DC | PRN

## 2013-08-12 MED ORDER — LUBIPROSTONE 24 MCG PO CAPS: 24.0000 ug | ORAL_CAPSULE | Freq: Two times a day (BID) | ORAL | Status: DC

## 2013-08-12 MED ORDER — HEPARIN SODIUM (PORCINE) 5000 UNIT/ML IJ SOLN
5000.0000 [IU] | Freq: Three times a day (TID) | INTRAMUSCULAR | Status: DC
  Administered 2013-08-12 (×2): 5000 [IU] via SUBCUTANEOUS
  Filled 2013-08-12 (×3): qty 1

## 2013-08-12 MED ORDER — ONDANSETRON HCL 4 MG/2ML IJ SOLN
4.0000 mg | Freq: Once | INTRAMUSCULAR | Status: AC
  Administered 2013-08-12: 4 mg via INTRAVENOUS
  Filled 2013-08-12: qty 2

## 2013-08-12 MED ORDER — CHLORHEXIDINE GLUCONATE 4 % EX LIQD
1.0000 "application " | Freq: Once | CUTANEOUS | Status: AC
  Administered 2013-08-12: 1 via TOPICAL
  Filled 2013-08-12: qty 15

## 2013-08-12 MED ORDER — SODIUM CHLORIDE 0.9 % IV BOLUS (SEPSIS)
1000.0000 mL | Freq: Once | INTRAVENOUS | Status: AC
  Administered 2013-08-12: 1000 mL via INTRAVENOUS

## 2013-08-12 MED ORDER — DULOXETINE HCL 60 MG PO CPEP
60.0000 mg | ORAL_CAPSULE | Freq: Every day | ORAL | Status: DC
  Administered 2013-08-12 – 2013-08-13 (×2): 60 mg via ORAL
  Filled 2013-08-12 (×2): qty 1

## 2013-08-12 MED ORDER — SORBITOL 70 % SOLN
960.0000 mL | TOPICAL_OIL | Freq: Once | ORAL | Status: AC
  Administered 2013-08-12: 960 mL via RECTAL
  Filled 2013-08-12: qty 240

## 2013-08-12 MED ORDER — SODIUM CHLORIDE 0.9 % IV SOLN
INTRAVENOUS | Status: DC
  Administered 2013-08-12: 23:00:00 via INTRAVENOUS

## 2013-08-12 MED ORDER — POLYETHYLENE GLYCOL 3350 17 G PO PACK: 17.0000 g | PACK | Freq: Every day | ORAL | Status: DC | PRN

## 2013-08-12 MED ORDER — IBUPROFEN 800 MG PO TABS
400.0000 mg | ORAL_TABLET | ORAL | Status: DC | PRN
  Administered 2013-08-12 – 2013-08-13 (×2): 400 mg via ORAL
  Filled 2013-08-12 (×2): qty 1

## 2013-08-12 MED ORDER — IOHEXOL 300 MG/ML  SOLN
100.0000 mL | Freq: Once | INTRAMUSCULAR | Status: AC | PRN
  Administered 2013-08-12: 100 mL via INTRAVENOUS

## 2013-08-12 MED ORDER — BISACODYL 10 MG RE SUPP: 10.0000 mg | Freq: Every day | RECTAL | Status: DC | PRN
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
## 2013-08-12 NOTE — ED Notes (Signed)
Pt reports RUQ abdominal pain radiating to right flank. Pt reports nausea and urinary frequency. Pt denies vomiting diarrhea.
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
## 2013-08-12 NOTE — ED Notes (Signed)
MD at bedside. 
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
## 2013-08-12 NOTE — Progress Notes (Signed)
ANTIBIOTIC CONSULT NOTE - INITIAL  Pharmacy Consult for Unasyn Indication: suspected intra-abdominal infection  No Known Allergies  Patient Measurements: Height: 5\' 7"  (170.2 cm) Weight: 190 lb (86.183 kg) IBW/kg (Calculated) : 61.6  Vital Signs: Temp: 98.1 F (36.7 C) (04/26 1229) Temp src: Oral (04/26 0735) BP: 126/80 mmHg (04/26 1229) Pulse Rate: 68 (04/26 1229) Intake/Output from previous day:   Intake/Output from this shift:    Labs:  Recent Labs  08/12/13 0806  WBC 7.9  HGB 11.8*  PLT 335  CREATININE 0.57   Estimated Creatinine Clearance: 84.3 ml/min (by C-G formula based on Cr of 0.57). No results found for this basename: VANCOTROUGH, VANCOPEAK, VANCORANDOM, GENTTROUGH, GENTPEAK, GENTRANDOM, TOBRATROUGH, TOBRAPEAK, TOBRARND, AMIKACINPEAK, AMIKACINTROU, AMIKACIN,  in the last 72 hours   Microbiology: No results found for this or any previous visit (from the past 720 hour(s)).  Medical History: Past Medical History  Diagnosis Date  . Fibromyalgia   . Brain tumor (benign)   . RA (rheumatoid arthritis)    Medications:  Scheduled:  . ampicillin-sulbactam (UNASYN) IV  1.5 g Intravenous Q6H  . docusate sodium  100 mg Oral BID  . DULoxetine  60 mg Oral Daily  . heparin  5,000 Units Subcutaneous 3 times per day  . sorbitol  30 mL Oral NOW  . sorbitol, milk of mag, mineral oil, glycerin (SMOG) enema  960 mL Rectal Once   Assessment: 63yo female admitted with RUQ pain and suspected intra-abdominal infection.  Pt has good renal fxn.    Unasyn 4/26 >> Zosyn 4/26 x 1 dose  Goal of Therapy:  Eradicate infection.  Plan: Unasyn 1.5gm IV q6hrs Monitor labs, renal fxn and progress  Marykay Mccleod A Ahlijah Raia 08/12/2013,12:52 PM

## 2013-08-13 ENCOUNTER — Encounter (HOSPITAL_COMMUNITY): Payer: Self-pay | Admitting: *Deleted

## 2013-08-13 ENCOUNTER — Encounter (HOSPITAL_COMMUNITY): Payer: BC Managed Care – PPO | Admitting: Anesthesiology

## 2013-08-13 ENCOUNTER — Encounter (HOSPITAL_COMMUNITY)
Admission: EM | Disposition: A | Discharge status: Home / Self Care | Payer: Self-pay | Source: Home / Self Care | Attending: Pulmonary Disease

## 2013-08-13 ENCOUNTER — Inpatient Hospital Stay (HOSPITAL_COMMUNITY): Payer: BC Managed Care – PPO | Admitting: Anesthesiology

## 2013-08-13 HISTORY — PX: CHOLECYSTECTOMY: SHX55

## 2013-08-13 LAB — COMPREHENSIVE METABOLIC PANEL
ALT: 425 U/L — ABNORMAL HIGH (ref 0–35)
AST: 280 U/L — ABNORMAL HIGH (ref 0–37)
Albumin: 3.1 g/dL — ABNORMAL LOW (ref 3.5–5.2)
Alkaline Phosphatase: 118 U/L — ABNORMAL HIGH (ref 39–117)
BUN: 9 mg/dL (ref 6–23)
CO2: 26 mEq/L (ref 19–32)
Calcium: 8.4 mg/dL (ref 8.4–10.5)
Chloride: 108 mEq/L (ref 96–112)
Creatinine, Ser: 0.61 mg/dL (ref 0.50–1.10)
GFR calc Af Amer: >90 mL/min (ref >90)
GFR calc non Af Amer: >90 mL/min (ref >90)
Glucose, Bld: 97 mg/dL (ref 70–99)
Potassium: 3.9 mEq/L (ref 3.7–5.3)
Sodium: 144 mEq/L (ref 137–147)
Total Bilirubin: 0.4 mg/dL (ref 0.3–1.2)
Total Protein: 6.2 g/dL (ref 6.0–8.3)

## 2013-08-13 LAB — CBC
HCT: 33.1 % — ABNORMAL LOW (ref 36.0–46.0)
Hemoglobin: 10.8 g/dL — ABNORMAL LOW (ref 12.0–15.0)
MCH: 29.4 pg (ref 26.0–34.0)
MCHC: 32.6 g/dL (ref 30.0–36.0)
MCV: 90.2 fL (ref 78.0–100.0)
Platelets: 322 10*3/uL (ref 150–400)
RBC: 3.67 MIL/uL — ABNORMAL LOW (ref 3.87–5.11)
RDW: 15 % (ref 11.5–15.5)
WBC: 7.9 10*3/uL (ref 4.0–10.5)

## 2013-08-13 SURGERY — LAPAROSCOPIC CHOLECYSTECTOMY
Anesthesia: General | Site: Abdomen

## 2013-08-13 MED ORDER — FENTANYL CITRATE 0.05 MG/ML IJ SOLN
25.0000 ug | INTRAMUSCULAR | Status: DC | PRN
  Administered 2013-08-13 (×2): 50 ug via INTRAVENOUS
  Filled 2013-08-13: qty 2

## 2013-08-13 MED ORDER — PROPOFOL 10 MG/ML IV BOLUS
INTRAVENOUS | Status: DC | PRN
  Administered 2013-08-13: 120 mg via INTRAVENOUS

## 2013-08-13 MED ORDER — SUCCINYLCHOLINE CHLORIDE 20 MG/ML IJ SOLN
INTRAMUSCULAR | Status: AC
  Filled 2013-08-13: qty 1

## 2013-08-13 MED ORDER — GLYCOPYRROLATE 0.2 MG/ML IJ SOLN
0.2000 mg | Freq: Once | INTRAMUSCULAR | Status: AC
  Administered 2013-08-13: 0.2 mg via INTRAVENOUS

## 2013-08-13 MED ORDER — KETOROLAC TROMETHAMINE 30 MG/ML IJ SOLN
30.0000 mg | Freq: Once | INTRAMUSCULAR | Status: AC
  Administered 2013-08-13: 30 mg via INTRAVENOUS
  Filled 2013-08-13: qty 1

## 2013-08-13 MED ORDER — DEXAMETHASONE SODIUM PHOSPHATE 4 MG/ML IJ SOLN
4.0000 mg | Freq: Once | INTRAMUSCULAR | Status: AC
  Administered 2013-08-13: 4 mg via INTRAVENOUS

## 2013-08-13 MED ORDER — FENTANYL CITRATE 0.05 MG/ML IJ SOLN
INTRAMUSCULAR | Status: AC
  Filled 2013-08-13: qty 2

## 2013-08-13 MED ORDER — FENTANYL CITRATE 0.05 MG/ML IJ SOLN
INTRAMUSCULAR | Status: DC | PRN
  Administered 2013-08-13 (×7): 50 ug via INTRAVENOUS

## 2013-08-13 MED ORDER — NEOSTIGMINE METHYLSULFATE 1 MG/ML IJ SOLN
INTRAMUSCULAR | Status: DC | PRN
  Administered 2013-08-13: 4 mg via INTRAVENOUS

## 2013-08-13 MED ORDER — ROCURONIUM BROMIDE 100 MG/10ML IV SOLN
INTRAVENOUS | Status: DC | PRN
  Administered 2013-08-13: 5 mg via INTRAVENOUS

## 2013-08-13 MED ORDER — HYDROCODONE-ACETAMINOPHEN 5-325 MG PO TABS
1.0000 | ORAL_TABLET | ORAL | Status: DC | PRN
  Administered 2013-08-14: 2 via ORAL
  Filled 2013-08-13: qty 2

## 2013-08-13 MED ORDER — LIDOCAINE HCL 1 % IJ SOLN
INTRAMUSCULAR | Status: DC | PRN
  Administered 2013-08-13: 30 mg via INTRADERMAL

## 2013-08-13 MED ORDER — ONDANSETRON HCL 4 MG/2ML IJ SOLN
INTRAMUSCULAR | Status: AC
  Filled 2013-08-13: qty 2

## 2013-08-13 MED ORDER — ONDANSETRON HCL 4 MG/2ML IJ SOLN: 4.0000 mg | Freq: Four times a day (QID) | INTRAMUSCULAR | Status: DC | PRN

## 2013-08-13 MED ORDER — LACTATED RINGERS IV SOLN
INTRAVENOUS | Status: DC
  Administered 2013-08-13 (×2): via INTRAVENOUS

## 2013-08-13 MED ORDER — MIDAZOLAM HCL 2 MG/2ML IJ SOLN
1.0000 mg | INTRAMUSCULAR | Status: DC | PRN
  Administered 2013-08-13: 1 mg via INTRAVENOUS

## 2013-08-13 MED ORDER — PROPOFOL 10 MG/ML IV BOLUS
INTRAVENOUS | Status: AC
  Filled 2013-08-13: qty 20

## 2013-08-13 MED ORDER — BUPIVACAINE HCL (PF) 0.5 % IJ SOLN
INTRAMUSCULAR | Status: AC
  Filled 2013-08-13: qty 30

## 2013-08-13 MED ORDER — HEMOSTATIC AGENTS (NO CHARGE) OPTIME
TOPICAL | Status: DC | PRN
  Administered 2013-08-13: 1 via TOPICAL

## 2013-08-13 MED ORDER — MORPHINE SULFATE 2 MG/ML IJ SOLN: 2.0000 mg | INTRAMUSCULAR | Status: DC | PRN

## 2013-08-13 MED ORDER — ROCURONIUM BROMIDE 50 MG/5ML IV SOLN
INTRAVENOUS | Status: AC
  Filled 2013-08-13: qty 1

## 2013-08-13 MED ORDER — FENTANYL CITRATE 0.05 MG/ML IJ SOLN
INTRAMUSCULAR | Status: AC
  Filled 2013-08-13: qty 5

## 2013-08-13 MED ORDER — SODIUM CHLORIDE 0.9 % IR SOLN
Status: DC | PRN
  Administered 2013-08-13: 1000 mL

## 2013-08-13 MED ORDER — ONDANSETRON HCL 4 MG/2ML IJ SOLN: 4.0000 mg | Freq: Once | INTRAMUSCULAR | Status: DC | PRN

## 2013-08-13 MED ORDER — BUPIVACAINE HCL (PF) 0.5 % IJ SOLN
INTRAMUSCULAR | Status: DC | PRN
  Administered 2013-08-13: 10 mL

## 2013-08-13 MED ORDER — MIDAZOLAM HCL 2 MG/2ML IJ SOLN
INTRAMUSCULAR | Status: AC
  Filled 2013-08-13: qty 2

## 2013-08-13 MED ORDER — GLYCOPYRROLATE 0.2 MG/ML IJ SOLN
INTRAMUSCULAR | Status: AC
  Filled 2013-08-13: qty 1

## 2013-08-13 MED ORDER — ONDANSETRON HCL 4 MG/2ML IJ SOLN
4.0000 mg | Freq: Once | INTRAMUSCULAR | Status: AC
  Administered 2013-08-13: 4 mg via INTRAVENOUS

## 2013-08-13 MED ORDER — GLYCOPYRROLATE 0.2 MG/ML IJ SOLN
INTRAMUSCULAR | Status: DC | PRN
  Administered 2013-08-13: 0.6 mg via INTRAVENOUS

## 2013-08-13 MED ORDER — HEPARIN SODIUM (PORCINE) 5000 UNIT/ML IJ SOLN
5000.0000 [IU] | Freq: Three times a day (TID) | INTRAMUSCULAR | Status: DC
  Administered 2013-08-14: 5000 [IU] via SUBCUTANEOUS
  Filled 2013-08-13: qty 1

## 2013-08-13 MED ORDER — LACTATED RINGERS IV SOLN
INTRAVENOUS | Status: DC
  Administered 2013-08-13: 75 mL/h via INTRAVENOUS

## 2013-08-13 MED ORDER — POVIDONE-IODINE 10 % EX OINT
TOPICAL_OINTMENT | CUTANEOUS | Status: AC
  Filled 2013-08-13: qty 2

## 2013-08-13 MED ORDER — POVIDONE-IODINE 10 % OINT PACKET
TOPICAL_OINTMENT | CUTANEOUS | Status: DC | PRN
  Administered 2013-08-13: 2 via TOPICAL

## 2013-08-13 MED ORDER — ONDANSETRON HCL 4 MG PO TABS: 4.0000 mg | ORAL_TABLET | Freq: Four times a day (QID) | ORAL | Status: DC | PRN

## 2013-08-13 MED ORDER — DEXAMETHASONE SODIUM PHOSPHATE 4 MG/ML IJ SOLN
INTRAMUSCULAR | Status: AC
  Filled 2013-08-13: qty 1

## 2013-08-13 SURGICAL SUPPLY — 46 items
APPLIER CLIP LAPSCP 10X32 DD (CLIP) ×2 IMPLANT
BAG HAMPER (MISCELLANEOUS) ×2 IMPLANT
BAG SPEC RTRVL LRG 6X4 10 (ENDOMECHANICALS) ×1
CLOTH BEACON ORANGE TIMEOUT ST (SAFETY) ×2 IMPLANT
COVER LIGHT HANDLE STERIS (MISCELLANEOUS) ×4 IMPLANT
DECANTER SPIKE VIAL GLASS SM (MISCELLANEOUS) ×2 IMPLANT
DURAPREP 26ML APPLICATOR (WOUND CARE) ×2 IMPLANT
ELECT REM PT RETURN 9FT ADLT (ELECTROSURGICAL) ×2
ELECTRODE REM PT RTRN 9FT ADLT (ELECTROSURGICAL) ×1 IMPLANT
FILTER SMOKE EVAC LAPAROSHD (FILTER) ×2 IMPLANT
FORMALIN 10 PREFIL 120ML (MISCELLANEOUS) ×2 IMPLANT
GLOVE BIO SURGEON STRL SZ7.5 (GLOVE) ×2 IMPLANT
GLOVE BIOGEL PI IND STRL 7.0 (GLOVE) IMPLANT
GLOVE BIOGEL PI IND STRL 7.5 (GLOVE) IMPLANT
GLOVE BIOGEL PI IND STRL 8 (GLOVE) ×1 IMPLANT
GLOVE BIOGEL PI INDICATOR 7.0 (GLOVE) ×2
GLOVE BIOGEL PI INDICATOR 7.5 (GLOVE) ×1
GLOVE BIOGEL PI INDICATOR 8 (GLOVE) ×1
GLOVE ECLIPSE 7.0 STRL STRAW (GLOVE) ×1 IMPLANT
GLOVE SS BIOGEL STRL SZ 6.5 (GLOVE) IMPLANT
GLOVE SUPERSENSE BIOGEL SZ 6.5 (GLOVE) ×3
GOWN STRL REUS W/TWL LRG LVL3 (GOWN DISPOSABLE) ×7 IMPLANT
HEMOSTAT SNOW SURGICEL 2X4 (HEMOSTASIS) ×2 IMPLANT
INST SET LAPROSCOPIC AP (KITS) ×2 IMPLANT
IV NS IRRIG 3000ML ARTHROMATIC (IV SOLUTION) IMPLANT
KIT ROOM TURNOVER APOR (KITS) ×2 IMPLANT
MANIFOLD NEPTUNE II (INSTRUMENTS) ×2 IMPLANT
NDL INSUFFLATION 14GA 120MM (NEEDLE) ×1 IMPLANT
NEEDLE INSUFFLATION 14GA 120MM (NEEDLE) ×2 IMPLANT
NS IRRIG 1000ML POUR BTL (IV SOLUTION) ×2 IMPLANT
PACK LAP CHOLE LZT030E (CUSTOM PROCEDURE TRAY) ×2 IMPLANT
PAD ARMBOARD 7.5X6 YLW CONV (MISCELLANEOUS) ×2 IMPLANT
POUCH SPECIMEN RETRIEVAL 10MM (ENDOMECHANICALS) ×2 IMPLANT
SET BASIN LINEN APH (SET/KITS/TRAYS/PACK) ×2 IMPLANT
SET TUBE IRRIG SUCTION NO TIP (IRRIGATION / IRRIGATOR) IMPLANT
SLEEVE ENDOPATH XCEL 5M (ENDOMECHANICALS) ×2 IMPLANT
SPONGE GAUZE 2X2 8PLY STRL LF (GAUZE/BANDAGES/DRESSINGS) ×8 IMPLANT
STAPLER VISISTAT (STAPLE) ×2 IMPLANT
SUT VICRYL 0 UR6 27IN ABS (SUTURE) ×2 IMPLANT
TAPE CLOTH SURG 4X10 WHT LF (GAUZE/BANDAGES/DRESSINGS) ×1 IMPLANT
TROCAR ENDO BLADELESS 11MM (ENDOMECHANICALS) ×2 IMPLANT
TROCAR XCEL NON-BLD 5MMX100MML (ENDOMECHANICALS) ×2 IMPLANT
TROCAR XCEL UNIV SLVE 11M 100M (ENDOMECHANICALS) ×2 IMPLANT
TUBING INSUFFLATION (TUBING) ×2 IMPLANT
WARMER LAPAROSCOPE (MISCELLANEOUS) ×2 IMPLANT
YANKAUER SUCT 12FT TUBE ARGYLE (SUCTIONS) ×2 IMPLANT

## 2013-08-13 NOTE — Anesthesia Procedure Notes (Signed)
Procedure Name: Intubation Date/Time: 08/13/2013 12:25 PM Performed by: Tressie Stalker E Pre-anesthesia Checklist: Patient identified, Patient being monitored, Timeout performed, Emergency Drugs available and Suction available Patient Re-evaluated:Patient Re-evaluated prior to inductionOxygen Delivery Method: Circle System Utilized Preoxygenation: Pre-oxygenation with 100% oxygen Intubation Type: IV induction Ventilation: Mask ventilation without difficulty Laryngoscope Size: Mac and 3 Grade View: Grade II Tube type: Oral Tube size: 7.0 mm Number of attempts: 1 Airway Equipment and Method: stylet and Video-laryngoscopy Placement Confirmation: ETT inserted through vocal cords under direct vision,  positive ETCO2 and breath sounds checked- equal and bilateral Secured at: 21 cm Tube secured with: Tape Dental Injury: Teeth and Oropharynx as per pre-operative assessment  Difficulty Due To: Difficulty was unanticipated and Difficult Airway- due to anterior larynx Comments: Looked with Mac 3, poor visualization, removed foam pillow and looked with larnygascope for second attempt.  Still poor visualization.  Video larnygascope.  Cords visualized and with some manipulation #7 ETT passed.

## 2013-08-13 NOTE — Op Note (Signed)
Patient:  Whitney Moreno  DOB:  [birth year]  MRN:  [id number]   Preop Diagnosis:  Cholecystitis, cholelithiasis  Postop Diagnosis:  Same  Procedure:  Laparoscopic cholecystectomy  Surgeon:  Aviva Signs, M.D.  Anes:  General endotracheal  Indications:  Patient is a 63 year old female who presents with cholecystitis secondary to cholelithiasis. Her liver enzyme tests have been improving. Her total bilirubin is within normal limits. The patient now comes the operating room for laparoscopic cholecystectomy. The risks and benefits of the procedure including bleeding, infection, hepatobiliary injury, the possibility of an open procedure were fully explained to the patient, who gave informed consent.  Procedure note:  The patient is placed the supine position. After induction of general endotracheal anesthesia, the abdomen was prepped and draped using usual sterile technique with DuraPrep. Surgical site confirmation was performed.  A supraumbilical incision was made down to the fascia. A Veress needle was introduced into the abdominal cavity and confirmation of placement was done using the saline drop test. The abdomen was then insufflated to 16 mm mercury pressure. An 11 mm trocar was introduced into the abdominal cavity and direct visualization without difficulty. The patient was placed in reverse Trendelenburg position and additional 11 mm trocar was placed the epigastric region and 5 mm trochars were placed the right upper quadrant and right flank regions. Liver was inspected and noted to be within normal limits. The gallbladder had some adhesions in the right upper quadrant due to previous surgery. These adhesions were lysed sharply without difficulty. The gallbladder was then retracted in a dynamic fashion in order to expose the triangle of calot. The cystic duct was first identified. Its juncture to the infundibulum was fully identified. Endoclips were placed proximally and distally on the cystic  duct, and the cystic duct was divided. This was likewise done to the cystic artery. The gallbladder was then freed away from the gallbladder fossa using Bovie electrocautery. The gallbladder was delivered through the epigastric trocar site using an Endo Catch bag. The gallbladder fossa was inspected and no abnormal bleeding or bile leakage was noted. Surgicel is placed the gallbladder fossa. All fluid and air were then evacuated from the abdominal cavity prior to removal of the trochars.  All wounds were irrigated with normal saline. All wounds were checked with 0.5% Sensorcaine. The supraumbilical fascia as well as epigastric fascia were reapproximated using 0 Vicryl interrupted sutures. All skin incisions were closed using staples. Betadine ointment and dry sterile dressings were applied.  All tape and needle counts were correct at the end of the procedure. Patient was extubated in the operating room and transferred to PACU in stable condition.  Complications:  None  EBL:  Minimal  Specimen:  Gallbladder

## 2013-08-13 NOTE — Anesthesia Postprocedure Evaluation (Signed)
  Anesthesia Post-op Note  Patient: Whitney Moreno  Procedure(s) Performed: Procedure(s): LAPAROSCOPIC CHOLECYSTECTOMY (N/A)  Patient Location: PACU  Anesthesia Type:General  Level of Consciousness: sedated and patient cooperative  Airway and Oxygen Therapy: Patient Spontanous Breathing and Patient connected to face mask oxygen  Post-op Pain: none  Post-op Assessment: Post-op Vital signs reviewed, Patient's Cardiovascular Status Stable, Respiratory Function Stable, Patent Airway, No signs of Nausea or vomiting and Pain level controlled  Post-op Vital Signs: Reviewed and stable  Last Vitals:  Filed Vitals:   08/13/13 1334  BP: 145/93  Pulse:   Temp: 36.9 C  Resp: 13    Complications: No apparent anesthesia complications

## 2013-08-13 NOTE — Anesthesia Preprocedure Evaluation (Addendum)
Anesthesia Evaluation  Patient identified by MRN, date of birth, ID band Patient awake    Reviewed: Allergy & Precautions, H&P , NPO status , Patient's Chart, lab work & pertinent test results  History of Anesthesia Complications (+) PONV and history of anesthetic complications  Airway Mallampati: II TM Distance: >3 FB     Dental  (+) Teeth Intact   Pulmonary former smoker,  breath sounds clear to auscultation        Cardiovascular Rhythm:Regular Rate:Normal     Neuro/Psych    GI/Hepatic negative GI ROS,   Endo/Other    Renal/GU      Musculoskeletal  (+) Arthritis -, Rheumatoid disorders,  Fibromyalgia -  Abdominal   Peds  Hematology   Anesthesia Other Findings Patient anterior; intubated with glidescope  Reproductive/Obstetrics                          Anesthesia Physical Anesthesia Plan  ASA: II  Anesthesia Plan: General   Post-op Pain Management:    Induction: Intravenous  Airway Management Planned: Oral ETT  Additional Equipment:   Intra-op Plan:   Post-operative Plan: Extubation in OR  Informed Consent: I have reviewed the patients History and Physical, chart, labs and discussed the procedure including the risks, benefits and alternatives for the proposed anesthesia with the patient or authorized representative who has indicated his/her understanding and acceptance.     Plan Discussed with:   Anesthesia Plan Comments:         Anesthesia Quick Evaluation

## 2013-08-13 NOTE — Transfer of Care (Signed)
Immediate Anesthesia Transfer of Care Note  Patient: Whitney Moreno  Procedure(s) Performed: Procedure(s): LAPAROSCOPIC CHOLECYSTECTOMY (N/A)  Patient Location: PACU  Anesthesia Type:General  Level of Consciousness: sedated and patient cooperative  Airway & Oxygen Therapy: Patient Spontanous Breathing and Patient connected to face mask oxygen  Post-op Assessment: Report given to PACU RN and Post -op Vital signs reviewed and stable  Post vital signs: Reviewed and stable  Complications: No apparent anesthesia complications

## 2013-08-13 NOTE — Progress Notes (Signed)
Subjective: Patient was admitted yesterday due to acute cholecystitis. She was started on IV antiboitics. Patient feels better today.  Objective: Vital signs in last 24 hours: Temp:  [97.8 F (36.6 C)-98.2 F (36.8 C)] 97.8 F (36.6 C) (04/27 0551) Pulse Rate:  [66-71] 67 (04/27 0551) Resp:  [18-19] 18 (04/27 0551) BP: (120-129)/(65-80) 124/67 mmHg (04/27 0551) SpO2:  [92 %-100 %] 94 % (04/27 0551) Weight:  [86.183 kg (190 lb)] 86.183 kg (190 lb) (04/26 1229) Weight change:  Last BM Date: 08/12/13  Intake/Output from previous day: 04/26 0701 - 04/27 0700 In: 1553.3 [P.O.:600; I.V.:753.3; IV Piggyback:200] Out: -   PHYSICAL EXAM General appearance: alert and no distress Resp: clear to auscultation bilaterally Cardio: S1, S2 normal GI: soft, non-tender; bowel sounds normal; no masses,  no organomegaly Extremities: extremities normal, atraumatic, no cyanosis or edema  Lab Results:    @labtest @ [facility] No results found for this basename: PHART, PCO2, PO2ART, TCO2, HCO3,  in the last 72 hours CULTURES Recent Results (from the past 240 hour(s))  SURGICAL PCR SCREEN     Status: None   Collection Time    08/12/13 10:10 PM      Result Value Ref Range Status   MRSA, PCR NEGATIVE  NEGATIVE Final   Staphylococcus aureus NEGATIVE  NEGATIVE Final   Comment:            The Xpert SA Assay (FDA     approved for NASAL specimens     in patients over 49 years of age),     is one component of     a comprehensive surveillance     program.  Test performance has     been validated by [employer] for patients greater     than or equal to 8 year old.     It is not intended     to diagnose infection nor to     guide or monitor treatment.   Studies/Results: Ct Abdomen Pelvis W Contrast  08/12/2013   CLINICAL DATA:  Two day history of right-sided abdominal pain  EXAM: CT ABDOMEN AND PELVIS WITH CONTRAST  TECHNIQUE: Multidetector CT imaging of the abdomen and pelvis was performed  using the standard protocol following bolus administration of intravenous contrast.  CONTRAST:  73mL OMNIPAQUE IOHEXOL 300 MG/ML SOLN, 121mL OMNIPAQUE IOHEXOL 300 MG/ML SOLN  COMPARISON:  None.  FINDINGS: Lower Chest: Dependent atelectasis in the bilateral lower lungs. Visualized cardiac structures within normal limits for size. No pericardial effusion. Unremarkable distal thoracic esophagus.  Abdomen: Unremarkable appearance of the stomach, duodenum and adrenal glands. Enhancing nodular soft tissue in the left upper quadrant consistent with splenosis. Nonspecific 6 mm ovoid hypo attenuating lesion in the uncinate process of the pancreas (image 38 series 2). Normal hepatic contour and morphology. No discrete hepatic lesion. However, there is a small amount of high attenuation ascites at the inferior margin of hepatic segment 6 consistent. The gallbladder is mildly distended with the transverse diameter of 4 cm. No intra or extrahepatic biliary ductal dilatation. No cholelithiasis by CT scan. There appears to be mild thickening of the gallbladder wall.  No evidence of hydronephrosis. No enhancing renal mass. Punctate nonobstructing stone in the interpolar left kidney. Multiple foci of renal cortical thinning predominantly involving the upper and lower poles the right kidney. There is a 1.8 cm water attenuation simple cysts in the upper pole of the right kidney.  Large volume of stool in the colon suggests constipation. No  evidence of obstruction or focal bowel wall thickening. The appendix is not visualized and may be surgically absent. Other than the focal perihepatic fluid, no free fluid and no evidence of free air.  Pelvis: Unremarkable bladder, uterus and adnexa. Trace free fluid is likely physiologic. No suspicious adenopathy.  Bones/Soft Tissues: No acute fracture or aggressive appearing lytic or blastic osseous lesion.  Vascular: Atherosclerotic vascular disease without significant stenosis or aneurysmal  dilatation.  IMPRESSION: 1. Suspect acute cholecystitis. The gallbladder is distended at 4 cm in diameter and there is a suggestion of gallbladder wall thickening and a small amount of high attenuation complex perihepatic fluid. The complexity of the fluid suggests exudative material, or possibly a small amount of hemo peritoneum. No hepatic laceration, contusion or other injury identified. Is there any clinical history of recent trauma to the right upper quadrant? 2. Nonobstructing left nephrolithiasis. 3. Bilateral lower lobe dependent atelectasis. 4. Nonspecific 6 mm hypo attenuating lesion in the uncinate process of the pancreas. Differential considerations include pancreatic cyst, pseudocyst if there is a past history of pancreatitis, and possibly neoplasm such as branch duct IPMN. A single followup abdominal MRI in 1 year is recommended. If the lesion is stable at that time, no further imaging follow-up would be required. This recommendation follows ACR consensus guidelines: Managing Incidental Findings on Abdominal CT: White Paper of the ACR Incidental Findings Committee. J Am Coll Radiol 2010;7:754-773 5. Large volume of colonic stool suggests constipation. 6. Additional ancillary findings as above.   Electronically Signed   By: Jacqulynn Cadet M.D.   On: 08/12/2013 10:30   Dg Chest Port 1 View  08/12/2013   CLINICAL DATA:  Preop  EXAM: PORTABLE CHEST - 1 VIEW  COMPARISON:  06/06/2013  FINDINGS: Left basilar opacity, likely atelectasis. No focal consolidation. No pleural effusion or pneumothorax.  The heart is top-normal in size.  IMPRESSION: No evidence of acute cardiopulmonary disease.   Electronically Signed   By: Julian Hy M.D.   On: 08/12/2013 12:56    Medications: I have reviewed the patient's current medications.  Assesment: Principal Problem:   Acute cholecystitis Active Problems:   OA (osteoarthritis) of knee   Cholecystitis   Constipation    Plan:Medications  reviewed Will continue iv antibiotics CMP in AM  Surgical consult.    LOS: 1 day   Brandin Dilday 08/13/2013, 7:45 AM

## 2013-08-13 NOTE — Care Management Note (Unsigned)
    Page 1 of 1   08/13/2013     4:35:07 PM CARE MANAGEMENT NOTE 08/13/2013  Patient:  Whitney Moreno, Whitney Moreno   Account Number:  [id number]  Date Initiated:  08/13/2013  Documentation initiated by:  Vladimir Creeks  Subjective/Objective Assessment:   admitted with cholecystitis. lap chole today. Pt is from home  with spouse and will return home at D/C     Action/Plan:   no needs identified   Anticipated DC Date:  08/14/2013   Anticipated DC Plan:  [place]  CM consult      Choice offered to / List presented to:             Status of service:  In process, will continue to follow Medicare Important Message given?   (If response is "NO", the following Medicare IM given date fields will be blank) Date Medicare IM given:   Date Additional Medicare IM given:    Discharge Disposition:    Per UR Regulation:  Reviewed for med. necessity/level of care/duration of stay  If discussed at [place] of Stay Meetings, dates discussed:    Comments:  08/13/13 [place] RN/CM

## 2013-08-14 LAB — CBC
HCT: 31.2 % — ABNORMAL LOW (ref 36.0–46.0)
Hemoglobin: 10.5 g/dL — ABNORMAL LOW (ref 12.0–15.0)
MCH: 30.3 pg (ref 26.0–34.0)
MCHC: 33.7 g/dL (ref 30.0–36.0)
MCV: 90.2 fL (ref 78.0–100.0)
Platelets: 320 10*3/uL (ref 150–400)
RBC: 3.46 MIL/uL — ABNORMAL LOW (ref 3.87–5.11)
RDW: 15 % (ref 11.5–15.5)
WBC: 11.3 10*3/uL — ABNORMAL HIGH (ref 4.0–10.5)

## 2013-08-14 LAB — BASIC METABOLIC PANEL
BUN: 9 mg/dL (ref 6–23)
CO2: 26 mEq/L (ref 19–32)
Calcium: 8.8 mg/dL (ref 8.4–10.5)
Chloride: 103 mEq/L (ref 96–112)
Creatinine, Ser: 0.61 mg/dL (ref 0.50–1.10)
GFR calc Af Amer: >90 mL/min (ref >90)
GFR calc non Af Amer: >90 mL/min (ref >90)
Glucose, Bld: 91 mg/dL (ref 70–99)
Potassium: 3.7 mEq/L (ref 3.7–5.3)
Sodium: 140 mEq/L (ref 137–147)

## 2013-08-14 LAB — HEPATIC FUNCTION PANEL
ALT: 275 U/L — ABNORMAL HIGH (ref 0–35)
AST: 118 U/L — ABNORMAL HIGH (ref 0–37)
Albumin: 3 g/dL — ABNORMAL LOW (ref 3.5–5.2)
Alkaline Phosphatase: 95 U/L (ref 39–117)
Bilirubin, Direct: <0.2 mg/dL (ref 0.0–0.3)
Total Bilirubin: 0.3 mg/dL (ref 0.3–1.2)
Total Protein: 6.1 g/dL (ref 6.0–8.3)

## 2013-08-14 MED ORDER — HYDROCODONE-ACETAMINOPHEN 5-325 MG PO TABS: 1.0000 | ORAL_TABLET | Freq: Four times a day (QID) | ORAL | Status: DC | PRN

## 2013-08-14 NOTE — Discharge Summary (Signed)
Physician Discharge Summary  Patient ID: Whitney Moreno MRN: [id number] DOB/AGE: 61-Mar-1952 61 y.o.  Admit date: 08/12/2013 Discharge date: 08/14/2013  Admission Diagnoses: Cholecystitis, cholelithiasis  Discharge Diagnoses: Same Principal Problem:   Acute cholecystitis Active Problems:   OA (osteoarthritis) of knee   Cholecystitis   Constipation   Discharged Condition: good  Hospital Course: Patient is a 63 year old female who presented to [facility] worsening right upper quadrant pain, nausea, and vomiting. She was found to have elevated liver enzyme tests. CT scan the abdomen revealed acute cholecystitis with cholelithiasis. She was admitted to the hospital for further evaluation treatment. Surgical consultation was obtained. She was taken to the operating room on 08/13/2013 and underwent laparoscopic cholecystectomy. She tolerated procedure well. Postoperative course has been unremarkable. Her diet was advanced at difficulty. Repeat liver enzyme tests reveal much improved SGOT and SGPT. She is being discharged home in good improving condition.  Treatments: surgery: Laparoscopic cholecystectomy on 08/13/2013  Discharge Exam: Blood pressure 110/59, pulse 65, temperature 98.2 F (36.8 C), temperature source Oral, resp. rate 20, height 5\' 7"  (1.702 m), weight 86.183 kg (190 lb), last menstrual period 02/16/2008, SpO2 96.00%. General appearance: alert, cooperative and no distress Resp: clear to auscultation bilaterally Cardio: regular rate and rhythm, S1, S2 normal, no murmur, click, rub or gallop GI: Soft. Dressings dry and intact.  Disposition: Home   Future Appointments Provider Department Dept Phone   12/07/2013 2:00 PM Lyman Speller, MD [facility] [phone]       Medication List         calcium-vitamin D 500-200 MG-UNIT per tablet  Commonly known as:  OSCAL WITH D  Take 1 tablet by mouth 2 (two) times daily.     CYMBALTA 60 MG  capsule  Generic drug:  DULoxetine  Take 60 mg by mouth daily.     folic acid 1 MG tablet  Commonly known as:  FOLVITE  Take 1 mg by mouth daily.     glucosamine-chondroitin 500-400 MG tablet  Take 1 tablet by mouth 2 (two) times daily.     HYDROcodone-acetaminophen 5-325 MG per tablet  Commonly known as:  NORCO/VICODIN  Take 1-2 tablets by mouth every 6 (six) hours as needed for moderate pain.     methotrexate 2.5 MG tablet  Commonly known as:  RHEUMATREX  Take 10 mg by mouth once a week. Caution:Chemotherapy. Protect from light. Takes on Sundays.     MULTIVITAMIN PO  Take 1 tablet by mouth daily.           Follow-up Information   Follow up with Jamesetta So, MD. Schedule an appointment as soon as possible for a visit on 08/21/2013.   Specialty:  General Surgery   Contact information:   [id number] [address] [phone]       Signed: Jamesetta So 08/14/2013, 7:54 AM

## 2013-08-14 NOTE — Progress Notes (Signed)
Subjective: Patient had lap. Choledochectomy. The procedure was uneventful. Patient is planned for discharge today by Dr. Arnoldo Morale.  Objective: Vital signs in last 24 hours: Temp:  [97.8 F (36.6 C)-98.4 F (36.9 C)] 98.2 F (36.8 C) (04/28 0545) Pulse Rate:  [65-83] 65 (04/28 0545) Resp:  [13-20] 20 (04/28 0545) BP: (110-147)/(59-93) 110/59 mmHg (04/28 0545) SpO2:  [90 %-100 %] 96 % (04/28 0545) Weight change:  Last BM Date: 08/13/13  Intake/Output from previous day: 04/27 0701 - 04/28 0700 In: 2120 [P.O.:120; I.V.:1800; IV Piggyback:200] Out: 1600 [Urine:1600]  PHYSICAL EXAM General appearance: alert and no distress Resp: clear to auscultation bilaterally Cardio: S1, S2 normal GI: soft, non-tender; bowel sounds normal; no masses,  no organomegaly Extremities: extremities normal, atraumatic, no cyanosis or edema  Lab Results:    @labtest @ [facility] No results found for this basename: PHART, PCO2, PO2ART, TCO2, HCO3,  in the last 72 hours CULTURES Recent Results (from the past 240 hour(s))  SURGICAL PCR SCREEN     Status: None   Collection Time    08/12/13 10:10 PM      Result Value Ref Range Status   MRSA, PCR NEGATIVE  NEGATIVE Final   Staphylococcus aureus NEGATIVE  NEGATIVE Final   Comment:            The Xpert SA Assay (FDA     approved for NASAL specimens     in patients over 22 years of age),     is one component of     a comprehensive surveillance     program.  Test performance has     been validated by [employer] for patients greater     than or equal to 70 year old.     It is not intended     to diagnose infection nor to     guide or monitor treatment.   Studies/Results: Ct Abdomen Pelvis W Contrast  08/12/2013   CLINICAL DATA:  Two day history of right-sided abdominal pain  EXAM: CT ABDOMEN AND PELVIS WITH CONTRAST  TECHNIQUE: Multidetector CT imaging of the abdomen and pelvis was performed using the standard protocol following bolus administration  of intravenous contrast.  CONTRAST:  54mL OMNIPAQUE IOHEXOL 300 MG/ML SOLN, 195mL OMNIPAQUE IOHEXOL 300 MG/ML SOLN  COMPARISON:  None.  FINDINGS: Lower Chest: Dependent atelectasis in the bilateral lower lungs. Visualized cardiac structures within normal limits for size. No pericardial effusion. Unremarkable distal thoracic esophagus.  Abdomen: Unremarkable appearance of the stomach, duodenum and adrenal glands. Enhancing nodular soft tissue in the left upper quadrant consistent with splenosis. Nonspecific 6 mm ovoid hypo attenuating lesion in the uncinate process of the pancreas (image 38 series 2). Normal hepatic contour and morphology. No discrete hepatic lesion. However, there is a small amount of high attenuation ascites at the inferior margin of hepatic segment 6 consistent. The gallbladder is mildly distended with the transverse diameter of 4 cm. No intra or extrahepatic biliary ductal dilatation. No cholelithiasis by CT scan. There appears to be mild thickening of the gallbladder wall.  No evidence of hydronephrosis. No enhancing renal mass. Punctate nonobstructing stone in the interpolar left kidney. Multiple foci of renal cortical thinning predominantly involving the upper and lower poles the right kidney. There is a 1.8 cm water attenuation simple cysts in the upper pole of the right kidney.  Large volume of stool in the colon suggests constipation. No evidence of obstruction or focal bowel wall thickening. The appendix is not visualized  and may be surgically absent. Other than the focal perihepatic fluid, no free fluid and no evidence of free air.  Pelvis: Unremarkable bladder, uterus and adnexa. Trace free fluid is likely physiologic. No suspicious adenopathy.  Bones/Soft Tissues: No acute fracture or aggressive appearing lytic or blastic osseous lesion.  Vascular: Atherosclerotic vascular disease without significant stenosis or aneurysmal dilatation.  IMPRESSION: 1. Suspect acute cholecystitis. The  gallbladder is distended at 4 cm in diameter and there is a suggestion of gallbladder wall thickening and a small amount of high attenuation complex perihepatic fluid. The complexity of the fluid suggests exudative material, or possibly a small amount of hemo peritoneum. No hepatic laceration, contusion or other injury identified. Is there any clinical history of recent trauma to the right upper quadrant? 2. Nonobstructing left nephrolithiasis. 3. Bilateral lower lobe dependent atelectasis. 4. Nonspecific 6 mm hypo attenuating lesion in the uncinate process of the pancreas. Differential considerations include pancreatic cyst, pseudocyst if there is a past history of pancreatitis, and possibly neoplasm such as branch duct IPMN. A single followup abdominal MRI in 1 year is recommended. If the lesion is stable at that time, no further imaging follow-up would be required. This recommendation follows ACR consensus guidelines: Managing Incidental Findings on Abdominal CT: White Paper of the ACR Incidental Findings Committee. J Am Coll Radiol 2010;7:754-773 5. Large volume of colonic stool suggests constipation. 6. Additional ancillary findings as above.   Electronically Signed   By: Jacqulynn Cadet M.D.   On: 08/12/2013 10:30   Dg Chest Port 1 View  08/12/2013   CLINICAL DATA:  Preop  EXAM: PORTABLE CHEST - 1 VIEW  COMPARISON:  06/06/2013  FINDINGS: Left basilar opacity, likely atelectasis. No focal consolidation. No pleural effusion or pneumothorax.  The heart is top-normal in size.  IMPRESSION: No evidence of acute cardiopulmonary disease.   Electronically Signed   By: Julian Hy M.D.   On: 08/12/2013 12:56    Medications: I have reviewed the patient's current medications.  Assesment: Principal Problem:   Acute cholecystitis Active Problems:   OA (osteoarthritis) of knee   Cholecystitis   Constipation    Plan:Medications reviewed As per Dr Arnoldo Morale Plan.    LOS: 2 days   Whitney Moreno 08/14/2013, 7:58 AM

## 2013-08-14 NOTE — Progress Notes (Signed)
Patient states understanding of discharge instructions, prescription given. 

## 2013-08-14 NOTE — Discharge Instructions (Signed)
Laparoscopic Cholecystectomy, Care After °Refer to this sheet in the next few weeks. These instructions provide you with information on caring for yourself after your procedure. Your health care provider may also give you more specific instructions. Your treatment has been planned according to current medical practices, but problems sometimes occur. Call your health care provider if you have any problems or questions after your procedure. °WHAT TO EXPECT AFTER THE PROCEDURE °After your procedure, it is typical to have the following: °· Pain at your incision sites. You will be given pain medicines to control the pain. °· Mild nausea or vomiting. This should improve after the first 24 hours. °· Bloating and possibly shoulder pain from the gas used during the procedure. This will improve after the first 24 hours. °HOME CARE INSTRUCTIONS  °· Change bandages (dressings) as directed by your health care provider. °· Keep the wound dry and clean. You may wash the wound gently with soap and water. Gently blot or dab the area dry. °· Do not take baths or use swimming pools or hot tubs for 2 weeks or until your health care provider approves. °· Only take over-the-counter or prescription medicines as directed by your health care provider. °· Continue your normal diet as directed by your health care provider. °· Do not lift anything heavier than 10 pounds (4.5 kg) until your health care provider approves. °· Do not play contact sports for 1 week or until your health care provider approves. °SEEK MEDICAL CARE IF:  °· You have redness, swelling, or increasing pain in the wound. °· You notice yellowish-white fluid (pus) coming from the wound. °· You have drainage from the wound that lasts longer than 1 day. °· You notice a bad smell coming from the wound or dressing. °· Your surgical cuts (incisions) break open. °SEEK IMMEDIATE MEDICAL CARE IF:  °· You develop a rash. °· You have difficulty breathing. °· You have chest pain. °· You  have a fever. °· You have increasing pain in the shoulders (shoulder strap areas). °· You have dizzy episodes or faint while standing. °· You have severe abdominal pain. °· You feel sick to your stomach (nauseous) or throw up (vomit) and this lasts for more than 1 day. °Document Released: 04/05/2005 Document Revised: 01/24/2013 Document Reviewed: 11/15/2012 °ExitCare® Patient Information ©2014 [employer]. ° °

## 2013-08-15 ENCOUNTER — Encounter (HOSPITAL_COMMUNITY): Payer: Self-pay | Admitting: General Surgery

## 2013-08-31 NOTE — Care Management Utilization Note (Signed)
UR completed 

## 2013-12-07 ENCOUNTER — Ambulatory Visit: Payer: BC Managed Care – PPO | Admitting: Obstetrics & Gynecology

## 2013-12-07 ENCOUNTER — Encounter: Payer: Self-pay | Admitting: Obstetrics & Gynecology

## 2013-12-13 ENCOUNTER — Encounter: Payer: Self-pay | Admitting: Podiatry

## 2013-12-13 ENCOUNTER — Ambulatory Visit (INDEPENDENT_AMBULATORY_CARE_PROVIDER_SITE_OTHER): Payer: BC Managed Care – PPO | Admitting: Podiatry

## 2013-12-13 ENCOUNTER — Ambulatory Visit (INDEPENDENT_AMBULATORY_CARE_PROVIDER_SITE_OTHER): Payer: BC Managed Care – PPO

## 2013-12-13 VITALS — BP 134/81 | HR 67 | Resp 15 | Ht 67.0 in | Wt 188.0 lb

## 2013-12-13 DIAGNOSIS — IMO0002 Reserved for concepts with insufficient information to code with codable children: Secondary | ICD-10-CM

## 2013-12-13 DIAGNOSIS — M792 Neuralgia and neuritis, unspecified: Secondary | ICD-10-CM

## 2013-12-13 DIAGNOSIS — M79609 Pain in unspecified limb: Secondary | ICD-10-CM

## 2013-12-13 DIAGNOSIS — M79673 Pain in unspecified foot: Secondary | ICD-10-CM

## 2013-12-13 NOTE — Progress Notes (Signed)
   Subjective:    Patient ID: Whitney Moreno, female    DOB: [birth year], 63 y.o.   MRN: [id number]  HPI Comments: Pt complains of stretching pain in the left foot, for 3 months.  Pt states the pain is worse when resting and when getting out of bed, then the pain is sharp.  Pt complains of toe pain in all toes. Pt states the cold air blowing on the toes worsens the pain, have a swollen or numb sensation.  Denies any trauma to the area. No pain with ambulation. She does take naproxen as needed. She is also been trying a home electrical stimulation therapy which seems to help.  Foot Pain Associated symptoms include arthralgias, congestion, diaphoresis, myalgias and numbness.      Review of Systems  Constitutional: Positive for diaphoresis.  HENT: Positive for congestion and sinus pressure.   Eyes:       Right eye surgical reconstruction.  Musculoskeletal: Positive for arthralgias, gait problem and myalgias.       Pt states she has problems with her knees, toes when walking.  Neurological: Positive for numbness.       Numbness to fingers  All other systems reviewed and are negative.      Objective:   Physical Exam AAO x3, NAD DP/PT pulses palpable b/l. CRT < 3 sec.  Protective sensation intact the Semmes Weinstein monofilament, vibratory sensation intact. Negative tinel sign. Some reproduction of symptoms upon compression of SPN and DPN.  Right first MTPJ limited range of motion. Mild decrease in medial arch height upon weightbearing. No pinpoint areas of bony tenderness. Equinus bilaterally. Diffuse mild tenderness over the dorsal aspect of the foot which appears to be mostly over the anterior muscle group. MMT 5/5, ROM WNL       Assessment & Plan:  63 year old female with likely tendinitis, possible neuritis versus neuropathy -At this appointment x-rays were obtained and reviewed with the patient. No acute fracture. See x-ray port for full details. -Symptoms of "stretching"  along the dorsal aspect of the foot most likely a result of tendinitis and overall tight musculature. -Nerve type symptoms may be results of neuritis versus neuropathy. Followup with primary care physician for evaluation of neuropathy and discussed possible neurology evaluation.  -Symptoms may be related to rheumatoid arthritis, fibromyalgia. -Discussed orthotic therapy to help support her foot type -Ice to the affected area. -She was previously prescribed Naprosyn. Recommended continued with this. -Followup in one month or sooner if any problems are to arise or change in symptoms.

## 2014-01-16 ENCOUNTER — Ambulatory Visit: Payer: BC Managed Care – PPO | Admitting: Podiatry

## 2014-02-01 ENCOUNTER — Telehealth: Payer: Self-pay | Admitting: Obstetrics & Gynecology

## 2014-02-01 ENCOUNTER — Other Ambulatory Visit: Payer: Self-pay

## 2014-02-01 NOTE — Telephone Encounter (Signed)
Patient missed an appointment 12/07/13 with Dr.Miller and was charged $50.00 dnka fee. Patient is asking if this can be waived,  she has never missed an appointment before. I told patient if this was to be waived it would not be waived again. I told patient I would check with our administrator.

## 2014-02-04 NOTE — Telephone Encounter (Signed)
Fee waived this once. Please remind patient of our appointment policy. Thanks!

## 2014-02-18 ENCOUNTER — Encounter: Payer: Self-pay | Admitting: Podiatry

## 2014-03-04 ENCOUNTER — Other Ambulatory Visit (HOSPITAL_COMMUNITY): Payer: Self-pay | Admitting: Internal Medicine

## 2014-03-04 DIAGNOSIS — M25562 Pain in left knee: Secondary | ICD-10-CM

## 2014-03-08 ENCOUNTER — Ambulatory Visit (HOSPITAL_COMMUNITY)
Admission: RE | Admit: 2014-03-08 | Discharge: 2014-03-08 | Disposition: A | Discharge status: Home / Self Care | Payer: BC Managed Care – PPO | Source: Ambulatory Visit | Attending: Internal Medicine | Admitting: Internal Medicine

## 2014-03-08 DIAGNOSIS — X58XXXA Exposure to other specified factors, initial encounter: Secondary | ICD-10-CM | POA: Insufficient documentation

## 2014-03-08 DIAGNOSIS — M25562 Pain in left knee: Secondary | ICD-10-CM | POA: Insufficient documentation

## 2014-03-08 DIAGNOSIS — M67462 Ganglion, left knee: Secondary | ICD-10-CM | POA: Insufficient documentation

## 2014-03-08 DIAGNOSIS — M179 Osteoarthritis of knee, unspecified: Secondary | ICD-10-CM | POA: Insufficient documentation

## 2014-03-08 DIAGNOSIS — S83242A Other tear of medial meniscus, current injury, left knee, initial encounter: Secondary | ICD-10-CM | POA: Diagnosis not present

## 2014-03-19 DIAGNOSIS — A925 Zika virus disease: Secondary | ICD-10-CM

## 2014-03-19 HISTORY — DX: Zika virus disease: A92.5

## 2014-03-26 ENCOUNTER — Ambulatory Visit (INDEPENDENT_AMBULATORY_CARE_PROVIDER_SITE_OTHER): Payer: BC Managed Care – PPO | Admitting: Orthopedic Surgery

## 2014-03-26 ENCOUNTER — Ambulatory Visit (INDEPENDENT_AMBULATORY_CARE_PROVIDER_SITE_OTHER): Payer: BC Managed Care – PPO

## 2014-03-26 VITALS — BP 119/50 | Ht 67.0 in | Wt 186.0 lb

## 2014-03-26 DIAGNOSIS — Z8739 Personal history of other diseases of the musculoskeletal system and connective tissue: Secondary | ICD-10-CM

## 2014-03-26 DIAGNOSIS — M545 Low back pain: Secondary | ICD-10-CM

## 2014-03-26 DIAGNOSIS — M129 Arthropathy, unspecified: Secondary | ICD-10-CM

## 2014-03-26 DIAGNOSIS — M1712 Unilateral primary osteoarthritis, left knee: Secondary | ICD-10-CM

## 2014-03-26 MED ORDER — GABAPENTIN 100 MG PO CAPS: 100.0000 mg | ORAL_CAPSULE | Freq: Three times a day (TID) | ORAL | Status: DC

## 2014-03-26 NOTE — Patient Instructions (Signed)
Check with your pharmacy for new medication 

## 2014-03-27 ENCOUNTER — Encounter: Payer: Self-pay | Admitting: Orthopedic Surgery
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
## 2014-04-20 ENCOUNTER — Emergency Department (HOSPITAL_COMMUNITY)
Admission: EM | Admit: 2014-04-20 | Discharge: 2014-04-20 | Disposition: A | Discharge status: Home / Self Care | Payer: BC Managed Care – PPO | Attending: Emergency Medicine | Admitting: Emergency Medicine

## 2014-04-20 ENCOUNTER — Encounter (HOSPITAL_COMMUNITY): Payer: Self-pay | Admitting: Emergency Medicine

## 2014-04-20 DIAGNOSIS — T426X5A Adverse effect of other antiepileptic and sedative-hypnotic drugs, initial encounter: Secondary | ICD-10-CM | POA: Insufficient documentation

## 2014-04-20 DIAGNOSIS — M797 Fibromyalgia: Secondary | ICD-10-CM | POA: Insufficient documentation

## 2014-04-20 DIAGNOSIS — R21 Rash and other nonspecific skin eruption: Secondary | ICD-10-CM | POA: Insufficient documentation

## 2014-04-20 DIAGNOSIS — Y998 Other external cause status: Secondary | ICD-10-CM | POA: Insufficient documentation

## 2014-04-20 DIAGNOSIS — Z79899 Other long term (current) drug therapy: Secondary | ICD-10-CM | POA: Diagnosis not present

## 2014-04-20 DIAGNOSIS — Z889 Allergy status to unspecified drugs, medicaments and biological substances status: Secondary | ICD-10-CM

## 2014-04-20 DIAGNOSIS — Z86011 Personal history of benign neoplasm of the brain: Secondary | ICD-10-CM | POA: Diagnosis not present

## 2014-04-20 DIAGNOSIS — M069 Rheumatoid arthritis, unspecified: Secondary | ICD-10-CM | POA: Diagnosis not present

## 2014-04-20 DIAGNOSIS — Y9389 Activity, other specified: Secondary | ICD-10-CM | POA: Insufficient documentation

## 2014-04-20 DIAGNOSIS — Z87891 Personal history of nicotine dependence: Secondary | ICD-10-CM | POA: Insufficient documentation

## 2014-04-20 DIAGNOSIS — Y9289 Other specified places as the place of occurrence of the external cause: Secondary | ICD-10-CM | POA: Diagnosis not present

## 2014-04-20 MED ORDER — PREDNISONE 20 MG PO TABS: 20.0000 mg | ORAL_TABLET | Freq: Two times a day (BID) | ORAL | Status: DC

## 2014-04-20 MED ORDER — FAMOTIDINE 20 MG PO TABS
20.0000 mg | ORAL_TABLET | Freq: Once | ORAL | Status: AC
  Administered 2014-04-20: 20 mg via ORAL
  Filled 2014-04-20: qty 1

## 2014-04-20 MED ORDER — PREDNISONE 50 MG PO TABS
60.0000 mg | ORAL_TABLET | Freq: Once | ORAL | Status: AC
  Administered 2014-04-20: 60 mg via ORAL
  Filled 2014-04-20 (×2): qty 1

## 2014-04-20 NOTE — ED Notes (Signed)
Pt reports starting taking Gabapentin a couple of weeks ago.

## 2014-04-20 NOTE — Discharge Instructions (Signed)
Use the prednisone, as prescribed. Also take Benadryl 25 mg 4 times a day, and Pepcid 20 mg twice a day for 5 days. Return here or see her doctor, as needed for problems.   Drug Allergy Allergic reactions to medicines are common. Some allergic reactions are mild. A delayed type of drug allergy that occurs 1 week or more after exposure to a medicine or vaccine is called serum sickness. A life-threatening, sudden (acute) allergic reaction that involves the whole body is called anaphylaxis. CAUSES  "True" drug allergies occur when there is an allergic reaction to a medicine. This is caused by overactivity of the immune system. First, the body becomes sensitized. The immune system is triggered by your first exposure to the medicine. Following this first exposure, future exposure to the same medicine may be life-threatening. Almost any medicine can cause an allergic reaction. Common ones are:  Penicillin.  Sulfonamides (sulfa drugs).  Local anesthetics.  X-ray dyes that contain iodine. SYMPTOMS  Common symptoms of a minor allergic reaction are:  Swelling around the mouth.  An itchy red rash or hives.  Vomiting or diarrhea. Anaphylaxis can cause swelling of the mouth and throat. This makes it difficult to breathe and swallow. Severe reactions can be fatal within seconds, even after exposure to only a trace amount of the drug that causes the reaction. HOME CARE INSTRUCTIONS   If you are unsure of what caused your reaction, keep a diary of foods and medicines used. Include the symptoms that followed. Avoid anything that causes reactions.  You may want to follow up with an allergy specialist after the reaction has cleared in order to be tested to confirm the allergy. It is important to confirm that your reaction is an allergy, not just a side effect to the medicine. If you have a true allergy to a medicine, this may prevent that medicine and related medicines from being given to you when you  are very ill.  If you have hives or a rash:  Take medicines as directed by your caregiver.  You may use an over-the-counter antihistamine (diphenhydramine) as needed.  Apply cold compresses to the skin or take baths in cool water. Avoid hot baths or showers.  If you are severely allergic:  Continuous observation after a severe reaction may be needed. Hospitalization is often required.  Wear a medical alert bracelet or necklace stating your allergy.  You and your family must learn how to use an anaphylaxis kit or give an epinephrine injection to temporarily treat an emergency allergic reaction. If you have had a severe reaction, always carry your epinephrine injection or anaphylaxis kit with you. This can be lifesaving if you have a severe reaction.  Do not drive or perform tasks after treatment until the medicines used to treat your reaction have worn off, or until your caregiver says it is okay. SEEK MEDICAL CARE IF:   You think you had an allergic reaction. Symptoms usually start within 30 minutes after exposure.  Symptoms are getting worse rather than better.  You develop new symptoms.  The symptoms that brought you to your caregiver return. SEEK IMMEDIATE MEDICAL CARE IF:   You have swelling of the mouth, difficulty breathing, or wheezing.  You have a tight feeling in your chest or throat.  You develop hives, swelling, or itching all over your body.  You develop severe vomiting or diarrhea.  You feel faint or pass out. This is an emergency. Use your epinephrine injection or anaphylaxis kit as you  have been instructed. Call for emergency medical help. Even if you improve after the injection, you need to be examined at a hospital emergency department. MAKE SURE YOU:   Understand these instructions.  Will watch your condition.  Will get help right away if you are not doing well or get worse. Document Released: 04/05/2005 Document Revised: 06/28/2011 Document Reviewed:  09/09/2010 [facility] Patient Information 2015 [place], [place]. This information is not intended to replace advice given to you by your health care provider. Make sure you discuss any questions you have with your health care provider.

## 2014-04-20 NOTE — ED Notes (Signed)
Pt c/o rash spreading across body since yesterday. Denies any angioedema or difficulty breathing but states her throat is beginning to feel scratchy. Rash is itching and burning.
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
## 2014-05-16 ENCOUNTER — Telehealth: Payer: Self-pay | Admitting: Obstetrics & Gynecology

## 2014-05-16 NOTE — Telephone Encounter (Signed)
Thank you. Encounter closed. 

## 2014-05-16 NOTE — Telephone Encounter (Signed)
Left message to call Kaitlyn at [phone]. 

## 2014-05-16 NOTE — Telephone Encounter (Signed)
Patient is having "stomach pain" and a referral to a gastroenterologist. Patient called her PCP and was told she would need an appointment before she could get a referral. Patient is hoping Dr.Miller could help with this referral. Patient is worried because she "has been to her [place] and is infected with [place] virus". Last seen 10/10/12.

## 2014-05-16 NOTE — Telephone Encounter (Signed)
Left message to call [place] at [phone].  Pulled patient's paper chart. Patient was seen with Dr.Mann for screening colonoscopy 11/22/2005. Patient does not need referral. Can call Dr.Mann;s office to go ahead and schedule.

## 2014-05-16 NOTE — Telephone Encounter (Signed)
Spoke with patient. Patient states that she has been experiencing nausea all week and vomited on Tuesday. "My stomach feels upset all the time. Every time I eat I feel like I am going to throw up. This is very unusual for me. It takes a lot for me to throw up. I don't even feel like drinking coffee in the morning. I have to drink juice. My stomach is always churning like there is something in there." Patient denies any diarrhea, fever, or chills. Patient went to [place] and was infected with the Zika virus. Patient states that she had blood work and was given medication when she returned to the [place]. Patient tried to schedule appointment with PCP but they do not have anything available until 2/16. "I do not feel well and that is too long to wait. I just want to see a GI doctor." Advised patient would send a message over to [place] and return call with further recommendations. Patient is agreeable.  Dr.Miller would you like me to refer patient to Dr.Mann?

## 2014-05-17 NOTE — Telephone Encounter (Signed)
Spoke with patient at time of return call. Advised patient was seen with Dr.Mann in 2007. Is an established patient and does not need referral. Patient is agreeable. Phone number provided to Dr.Mann's practice. Patient will call to schedule at this time.  Encounter previously closed.

## 2014-05-23 ENCOUNTER — Other Ambulatory Visit: Payer: Self-pay | Admitting: Gastroenterology

## 2014-05-23 DIAGNOSIS — K869 Disease of pancreas, unspecified: Secondary | ICD-10-CM

## 2014-05-26 ENCOUNTER — Ambulatory Visit
Admission: RE | Admit: 2014-05-26 | Discharge: 2014-05-26 | Disposition: A | Discharge status: Home / Self Care | Payer: BC Managed Care – PPO | Source: Ambulatory Visit | Attending: Gastroenterology | Admitting: Gastroenterology

## 2014-05-26 DIAGNOSIS — K869 Disease of pancreas, unspecified: Secondary | ICD-10-CM

## 2014-05-26 MED ORDER — GADOBENATE DIMEGLUMINE 529 MG/ML IV SOLN
17.0000 mL | Freq: Once | INTRAVENOUS | Status: AC | PRN
  Administered 2014-05-26: 17 mL via INTRAVENOUS

## 2014-05-30 ENCOUNTER — Ambulatory Visit (INDEPENDENT_AMBULATORY_CARE_PROVIDER_SITE_OTHER): Payer: BC Managed Care – PPO | Admitting: Orthopedic Surgery

## 2014-05-30 ENCOUNTER — Encounter: Payer: Self-pay | Admitting: Orthopedic Surgery

## 2014-05-30 DIAGNOSIS — M79605 Pain in left leg: Secondary | ICD-10-CM

## 2014-05-30 DIAGNOSIS — M545 Low back pain, unspecified: Secondary | ICD-10-CM

## 2014-05-30 NOTE — Patient Instructions (Signed)
Continue gabapentin  We will schedule MRI for you ( needs early am or late afternoon appt )  We will call you with results

## 2014-06-04 NOTE — Progress Notes (Signed)
No chief complaint on file.   LMP 02/16/2008  The patient presents with recurrence of pain on the back of her leg burning from her hip radiating down into her left foot having pain on the top of the foot numbness and tingling on the lateral part of the foot and leg. Her knee has not been bothering her except when she is bending it. She does have a history of fibromyalgia she is being treated for that by another physician  Review of systems she's developed pancreatic cyst she has abdominal pain nausea vomiting which is being worked up  Exam shows a well-developed well-nourished female awake alert and oriented 3 mood and affect are normal she walks in with no assistive devices. She has no palpable tenderness on the joint lines but she has tenderness in her lower back decreased range of motion and painful range of motion of the lumbar spine especially extension. Motor exam is normal she has decreased sensation on the lateral border of the foot and top of the foot she has an asymmetric left knee jerk reflex lymph nodes are negative pulses are intact  Recommend MRI lumbar spine for left leg radicular pain and herniated disc  Continue gabapentin 300 mg daily at bedtime call patient for results

## 2014-06-11 ENCOUNTER — Ambulatory Visit (HOSPITAL_COMMUNITY)
Admission: RE | Admit: 2014-06-11 | Discharge: 2014-06-11 | Disposition: A | Discharge status: Home / Self Care | Payer: BC Managed Care – PPO | Source: Ambulatory Visit | Attending: Orthopedic Surgery | Admitting: Orthopedic Surgery

## 2014-06-11 DIAGNOSIS — M5416 Radiculopathy, lumbar region: Secondary | ICD-10-CM | POA: Insufficient documentation

## 2014-06-11 DIAGNOSIS — M545 Low back pain, unspecified: Secondary | ICD-10-CM

## 2014-06-11 DIAGNOSIS — M79605 Pain in left leg: Secondary | ICD-10-CM

## 2014-06-17 ENCOUNTER — Telehealth: Payer: Self-pay | Admitting: Orthopedic Surgery

## 2014-06-17 NOTE — Telephone Encounter (Signed)
Call received from patient - states she has seen her MRI report on "My chart"; asking if she may speak with Dr Aline Brochure about the results.  Ph#'s are (1) [phone],at this work# until 3:00pm; (2) [phone], home, 3:30pm and after.

## 2014-06-17 NOTE — Telephone Encounter (Signed)
Patient returned Dr Ruthe Mannan call regarding MRI; said she was sorry she missed his call.

## 2014-06-18 HISTORY — PX: COLONOSCOPY: SHX174

## 2014-06-18 NOTE — Telephone Encounter (Signed)
Results relayed   Needs esi lumbar   First available   [facility] imaging OR Dr Lyla Son

## 2014-06-19 ENCOUNTER — Other Ambulatory Visit: Payer: Self-pay | Admitting: *Deleted

## 2014-06-19 DIAGNOSIS — M48061 Spinal stenosis, lumbar region without neurogenic claudication: Secondary | ICD-10-CM

## 2014-06-19 NOTE — Progress Notes (Signed)
Good for three injections

## 2014-06-24 ENCOUNTER — Ambulatory Visit
Admission: RE | Admit: 2014-06-24 | Discharge: 2014-06-24 | Disposition: A | Discharge status: Home / Self Care | Payer: BC Managed Care – PPO | Source: Ambulatory Visit | Attending: Orthopedic Surgery | Admitting: Orthopedic Surgery

## 2014-06-24 DIAGNOSIS — M48061 Spinal stenosis, lumbar region without neurogenic claudication: Secondary | ICD-10-CM

## 2014-06-24 MED ORDER — IOHEXOL 180 MG/ML  SOLN
1.0000 mL | Freq: Once | INTRAMUSCULAR | Status: AC | PRN
  Administered 2014-06-24: 1 mL via EPIDURAL

## 2014-06-24 MED ORDER — METHYLPREDNISOLONE ACETATE 40 MG/ML INJ SUSP (RADIOLOG
120.0000 mg | Freq: Once | INTRAMUSCULAR | Status: AC
  Administered 2014-06-24: 120 mg via EPIDURAL

## 2014-06-24 NOTE — Discharge Instructions (Signed)

## 2014-07-03 ENCOUNTER — Other Ambulatory Visit: Payer: Self-pay | Admitting: Orthopedic Surgery

## 2014-07-04 ENCOUNTER — Encounter: Payer: Self-pay | Admitting: Obstetrics & Gynecology

## 2014-07-08 ENCOUNTER — Other Ambulatory Visit: Payer: Self-pay | Admitting: Orthopedic Surgery

## 2014-07-08 DIAGNOSIS — M79605 Pain in left leg: Secondary | ICD-10-CM

## 2014-07-08 DIAGNOSIS — M545 Low back pain, unspecified: Secondary | ICD-10-CM

## 2014-07-09 ENCOUNTER — Ambulatory Visit (INDEPENDENT_AMBULATORY_CARE_PROVIDER_SITE_OTHER): Payer: BC Managed Care – PPO | Admitting: Obstetrics & Gynecology

## 2014-07-09 ENCOUNTER — Encounter: Payer: Self-pay | Admitting: Obstetrics & Gynecology

## 2014-07-09 VITALS — BP 104/58 | HR 68 | Resp 16 | Ht 66.0 in | Wt 186.4 lb

## 2014-07-09 DIAGNOSIS — Z0000 Encounter for general adult medical examination without abnormal findings: Secondary | ICD-10-CM

## 2014-07-09 DIAGNOSIS — Z01419 Encounter for gynecological examination (general) (routine) without abnormal findings: Secondary | ICD-10-CM

## 2014-07-09 DIAGNOSIS — Z124 Encounter for screening for malignant neoplasm of cervix: Secondary | ICD-10-CM

## 2014-07-09 LAB — POCT URINALYSIS DIPSTICK
Bilirubin, UA: NEGATIVE
Glucose, UA: NEGATIVE
Ketones, UA: NEGATIVE
Nitrite, UA: NEGATIVE
Protein, UA: NEGATIVE
Urobilinogen, UA: NEGATIVE
pH, UA: 5
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
## 2014-07-11 LAB — IPS PAP TEST WITH REFLEX TO HPV

## 2014-07-12 ENCOUNTER — Ambulatory Visit
Admission: RE | Admit: 2014-07-12 | Discharge: 2014-07-12 | Disposition: A | Discharge status: Home / Self Care | Payer: BC Managed Care – PPO | Source: Ambulatory Visit | Attending: Orthopedic Surgery | Admitting: Orthopedic Surgery

## 2014-07-12 DIAGNOSIS — M545 Low back pain, unspecified: Secondary | ICD-10-CM

## 2014-07-12 DIAGNOSIS — M79605 Pain in left leg: Secondary | ICD-10-CM

## 2014-07-12 MED ORDER — METHYLPREDNISOLONE ACETATE 40 MG/ML INJ SUSP (RADIOLOG
120.0000 mg | Freq: Once | INTRAMUSCULAR | Status: AC
  Administered 2014-07-12: 120 mg via EPIDURAL

## 2014-07-12 MED ORDER — IOHEXOL 180 MG/ML  SOLN
1.0000 mL | Freq: Once | INTRAMUSCULAR | Status: AC | PRN
  Administered 2014-07-12: 1 mL via EPIDURAL

## 2015-04-20 HISTORY — PX: CATARACT EXTRACTION: SUR2

## 2015-07-03 ENCOUNTER — Telehealth: Payer: Self-pay | Admitting: *Deleted

## 2015-07-03 NOTE — Telephone Encounter (Signed)
Pt called to get the diagnosis of the last visit.  I told pt Dr. Jacqualyn Posey said she had neuritis which is a inflammation of nerves.  Pt asked if she needed to make an appt when she had the pain because she had not had the pain in a long time.  I told pt to get an appt when she had the pain, because that would also help with the diagnosis.  Pt agreed.

## 2015-09-16 ENCOUNTER — Ambulatory Visit: Payer: BC Managed Care – PPO | Admitting: Obstetrics & Gynecology

## 2015-09-16 ENCOUNTER — Encounter: Payer: Self-pay | Admitting: Obstetrics & Gynecology

## 2015-09-18 HISTORY — PX: REPLACEMENT TOTAL KNEE: SUR1224

## 2015-11-03 ENCOUNTER — Ambulatory Visit (HOSPITAL_COMMUNITY): Payer: BC Managed Care – PPO | Attending: Orthopedic Surgery | Admitting: Physical Therapy

## 2015-11-03 DIAGNOSIS — M6281 Muscle weakness (generalized): Secondary | ICD-10-CM | POA: Diagnosis present

## 2015-11-03 DIAGNOSIS — M25662 Stiffness of left knee, not elsewhere classified: Secondary | ICD-10-CM | POA: Diagnosis present

## 2015-11-03 DIAGNOSIS — R600 Localized edema: Secondary | ICD-10-CM | POA: Diagnosis present

## 2015-11-03 DIAGNOSIS — R262 Difficulty in walking, not elsewhere classified: Secondary | ICD-10-CM | POA: Diagnosis present
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
## 2015-11-04 ENCOUNTER — Ambulatory Visit (HOSPITAL_COMMUNITY): Payer: BC Managed Care – PPO | Admitting: Physical Therapy

## 2015-11-04 DIAGNOSIS — M25662 Stiffness of left knee, not elsewhere classified: Secondary | ICD-10-CM

## 2015-11-04 DIAGNOSIS — R600 Localized edema: Secondary | ICD-10-CM

## 2015-11-04 DIAGNOSIS — M6281 Muscle weakness (generalized): Secondary | ICD-10-CM

## 2015-11-04 DIAGNOSIS — R262 Difficulty in walking, not elsewhere classified: Secondary | ICD-10-CM

## 2015-11-04 NOTE — Patient Instructions (Signed)
Strengthening: Quadriceps Set    Tighten muscles on top of thighs by pushing knees down into surface. Hold __3__ seconds. Repeat _10___ times per set. Do __1__ sets per session. Do _2___ sessions per day.  http://orth.exer.us/602   Copyright  VHI. All rights reserved.  Strengthening: Terminal Knee Extension (Supine)    With right knee over bolster, straighten knee by tightening muscles on top of thigh. Keep bottom of knee on bolster. Repeat __10__ times per set. Do _1___ sets per session. Do ___3_ sessions per day.  http://orth.exer.us/626   Copyright  VHI. All rights reserved.  Stretching: Hamstring (Supine)    Supporting right thigh behind knee, slowly straighten knee until stretch is felt in back of thigh. Hold _30___ seconds. Repeat ___3_ times per set. Do ___1_ sets per session. Do __2__ sessions per day.  http://orth.exer.us/656   Copyright  VHI. All rights reserved.  Self-Mobilization: Heel Slide (Supine)    Slide left heel toward buttocks until a gentle stretch is felt. Hold __10__ seconds. Relax. Repeat __1__ times per set. Do __1__ sets per session. Do _3___ sessions per day.  http://orth.exer.us/710   Copyright  VHI. All rights reserved.  Knee Extension (Sitting)    Place ___0_ pound weight on left ankle and straighten knee fully, lower slowly. Repeat __10__ times per set.  http://orth.exer.us/732   Copyright  VHI. All rights reserved.
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
## 2015-11-06 ENCOUNTER — Ambulatory Visit (HOSPITAL_COMMUNITY): Payer: BC Managed Care – PPO

## 2015-11-06 DIAGNOSIS — M25662 Stiffness of left knee, not elsewhere classified: Secondary | ICD-10-CM | POA: Diagnosis not present

## 2015-11-06 DIAGNOSIS — R600 Localized edema: Secondary | ICD-10-CM

## 2015-11-06 DIAGNOSIS — M6281 Muscle weakness (generalized): Secondary | ICD-10-CM

## 2015-11-06 DIAGNOSIS — R262 Difficulty in walking, not elsewhere classified: Secondary | ICD-10-CM
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
## 2015-11-10 ENCOUNTER — Ambulatory Visit (HOSPITAL_COMMUNITY): Payer: BC Managed Care – PPO | Admitting: Physical Therapy

## 2015-11-10 DIAGNOSIS — M6281 Muscle weakness (generalized): Secondary | ICD-10-CM

## 2015-11-10 DIAGNOSIS — R262 Difficulty in walking, not elsewhere classified: Secondary | ICD-10-CM

## 2015-11-10 DIAGNOSIS — R600 Localized edema: Secondary | ICD-10-CM

## 2015-11-10 DIAGNOSIS — M25662 Stiffness of left knee, not elsewhere classified: Secondary | ICD-10-CM
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
## 2015-11-11 ENCOUNTER — Ambulatory Visit (HOSPITAL_COMMUNITY): Payer: BC Managed Care – PPO | Admitting: Physical Therapy

## 2015-11-11 DIAGNOSIS — M25662 Stiffness of left knee, not elsewhere classified: Secondary | ICD-10-CM

## 2015-11-11 DIAGNOSIS — M6281 Muscle weakness (generalized): Secondary | ICD-10-CM

## 2015-11-11 DIAGNOSIS — R262 Difficulty in walking, not elsewhere classified: Secondary | ICD-10-CM

## 2015-11-11 DIAGNOSIS — R600 Localized edema: Secondary | ICD-10-CM
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
## 2015-11-12 ENCOUNTER — Ambulatory Visit (HOSPITAL_COMMUNITY): Payer: BC Managed Care – PPO | Admitting: Physical Therapy

## 2015-11-12 DIAGNOSIS — M25662 Stiffness of left knee, not elsewhere classified: Secondary | ICD-10-CM

## 2015-11-12 DIAGNOSIS — M6281 Muscle weakness (generalized): Secondary | ICD-10-CM

## 2015-11-12 DIAGNOSIS — R262 Difficulty in walking, not elsewhere classified: Secondary | ICD-10-CM

## 2015-11-12 DIAGNOSIS — R600 Localized edema: Secondary | ICD-10-CM
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
## 2015-11-14 ENCOUNTER — Ambulatory Visit (HOSPITAL_COMMUNITY): Payer: BC Managed Care – PPO | Admitting: Physical Therapy

## 2015-11-14 DIAGNOSIS — M25662 Stiffness of left knee, not elsewhere classified: Secondary | ICD-10-CM | POA: Diagnosis not present

## 2015-11-14 DIAGNOSIS — M6281 Muscle weakness (generalized): Secondary | ICD-10-CM

## 2015-11-14 DIAGNOSIS — R262 Difficulty in walking, not elsewhere classified: Secondary | ICD-10-CM

## 2015-11-14 DIAGNOSIS — R600 Localized edema: Secondary | ICD-10-CM
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
## 2015-11-17 ENCOUNTER — Ambulatory Visit (HOSPITAL_COMMUNITY): Payer: BC Managed Care – PPO | Admitting: Physical Therapy

## 2015-11-17 DIAGNOSIS — M25662 Stiffness of left knee, not elsewhere classified: Secondary | ICD-10-CM

## 2015-11-17 DIAGNOSIS — R600 Localized edema: Secondary | ICD-10-CM

## 2015-11-17 DIAGNOSIS — M6281 Muscle weakness (generalized): Secondary | ICD-10-CM

## 2015-11-17 DIAGNOSIS — R262 Difficulty in walking, not elsewhere classified: Secondary | ICD-10-CM
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
## 2015-11-19 ENCOUNTER — Ambulatory Visit (HOSPITAL_COMMUNITY): Payer: BC Managed Care – PPO | Attending: Orthopedic Surgery | Admitting: Physical Therapy

## 2015-11-19 DIAGNOSIS — M6281 Muscle weakness (generalized): Secondary | ICD-10-CM | POA: Diagnosis present

## 2015-11-19 DIAGNOSIS — M25662 Stiffness of left knee, not elsewhere classified: Secondary | ICD-10-CM | POA: Diagnosis present

## 2015-11-19 DIAGNOSIS — R262 Difficulty in walking, not elsewhere classified: Secondary | ICD-10-CM | POA: Diagnosis present

## 2015-11-19 DIAGNOSIS — R600 Localized edema: Secondary | ICD-10-CM | POA: Insufficient documentation
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
## 2015-11-21 ENCOUNTER — Telehealth (HOSPITAL_COMMUNITY): Payer: Self-pay

## 2015-11-21 ENCOUNTER — Ambulatory Visit (HOSPITAL_COMMUNITY): Payer: BC Managed Care – PPO

## 2015-11-21 NOTE — Telephone Encounter (Signed)
Pt called to cx appt

## 2015-11-21 NOTE — Telephone Encounter (Signed)
Pt called and asked that today's appt be cx

## 2015-11-24 ENCOUNTER — Ambulatory Visit (HOSPITAL_COMMUNITY): Payer: BC Managed Care – PPO | Admitting: Physical Therapy

## 2015-11-24 DIAGNOSIS — R262 Difficulty in walking, not elsewhere classified: Secondary | ICD-10-CM

## 2015-11-24 DIAGNOSIS — M25662 Stiffness of left knee, not elsewhere classified: Secondary | ICD-10-CM

## 2015-11-24 DIAGNOSIS — R600 Localized edema: Secondary | ICD-10-CM

## 2015-11-24 DIAGNOSIS — M6281 Muscle weakness (generalized): Secondary | ICD-10-CM
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
## 2015-11-26 ENCOUNTER — Ambulatory Visit (HOSPITAL_COMMUNITY): Payer: BC Managed Care – PPO | Admitting: Physical Therapy

## 2015-11-26 DIAGNOSIS — M25662 Stiffness of left knee, not elsewhere classified: Secondary | ICD-10-CM | POA: Diagnosis not present

## 2015-11-26 DIAGNOSIS — R600 Localized edema: Secondary | ICD-10-CM

## 2015-11-26 DIAGNOSIS — R262 Difficulty in walking, not elsewhere classified: Secondary | ICD-10-CM

## 2015-11-26 DIAGNOSIS — M6281 Muscle weakness (generalized): Secondary | ICD-10-CM
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
## 2015-11-28 ENCOUNTER — Ambulatory Visit (HOSPITAL_COMMUNITY): Payer: BC Managed Care – PPO

## 2015-11-28 DIAGNOSIS — M6281 Muscle weakness (generalized): Secondary | ICD-10-CM

## 2015-11-28 DIAGNOSIS — R600 Localized edema: Secondary | ICD-10-CM

## 2015-11-28 DIAGNOSIS — M25662 Stiffness of left knee, not elsewhere classified: Secondary | ICD-10-CM

## 2015-11-28 DIAGNOSIS — R262 Difficulty in walking, not elsewhere classified: Secondary | ICD-10-CM
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
## 2015-12-01 ENCOUNTER — Ambulatory Visit (HOSPITAL_COMMUNITY): Payer: BC Managed Care – PPO | Admitting: Physical Therapy

## 2015-12-01 ENCOUNTER — Encounter (HOSPITAL_COMMUNITY): Payer: BC Managed Care – PPO | Admitting: Physical Therapy

## 2015-12-01 DIAGNOSIS — M25662 Stiffness of left knee, not elsewhere classified: Secondary | ICD-10-CM

## 2015-12-01 DIAGNOSIS — R600 Localized edema: Secondary | ICD-10-CM

## 2015-12-01 DIAGNOSIS — R262 Difficulty in walking, not elsewhere classified: Secondary | ICD-10-CM

## 2015-12-01 DIAGNOSIS — M6281 Muscle weakness (generalized): Secondary | ICD-10-CM
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
## 2015-12-02 ENCOUNTER — Encounter (HOSPITAL_COMMUNITY): Payer: BC Managed Care – PPO | Admitting: Physical Therapy

## 2015-12-03 ENCOUNTER — Ambulatory Visit (HOSPITAL_COMMUNITY): Payer: BC Managed Care – PPO

## 2015-12-03 DIAGNOSIS — R262 Difficulty in walking, not elsewhere classified: Secondary | ICD-10-CM

## 2015-12-03 DIAGNOSIS — M6281 Muscle weakness (generalized): Secondary | ICD-10-CM

## 2015-12-03 DIAGNOSIS — M25662 Stiffness of left knee, not elsewhere classified: Secondary | ICD-10-CM | POA: Diagnosis not present

## 2015-12-03 DIAGNOSIS — R600 Localized edema: Secondary | ICD-10-CM
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
## 2015-12-05 ENCOUNTER — Ambulatory Visit (HOSPITAL_COMMUNITY): Payer: BC Managed Care – PPO

## 2015-12-05 ENCOUNTER — Ambulatory Visit (INDEPENDENT_AMBULATORY_CARE_PROVIDER_SITE_OTHER): Payer: BC Managed Care – PPO | Admitting: Obstetrics & Gynecology

## 2015-12-05 ENCOUNTER — Encounter: Payer: Self-pay | Admitting: Obstetrics & Gynecology

## 2015-12-05 VITALS — BP 122/80 | HR 80 | Resp 16 | Ht 66.75 in | Wt 185.6 lb

## 2015-12-05 DIAGNOSIS — M25662 Stiffness of left knee, not elsewhere classified: Secondary | ICD-10-CM

## 2015-12-05 DIAGNOSIS — Z205 Contact with and (suspected) exposure to viral hepatitis: Secondary | ICD-10-CM

## 2015-12-05 DIAGNOSIS — Z01419 Encounter for gynecological examination (general) (routine) without abnormal findings: Secondary | ICD-10-CM | POA: Diagnosis not present

## 2015-12-05 DIAGNOSIS — R262 Difficulty in walking, not elsewhere classified: Secondary | ICD-10-CM

## 2015-12-05 DIAGNOSIS — R600 Localized edema: Secondary | ICD-10-CM

## 2015-12-05 DIAGNOSIS — M6281 Muscle weakness (generalized): Secondary | ICD-10-CM

## 2015-12-05 LAB — HEPATITIS C ANTIBODY: HCV Ab: NEGATIVE

## 2015-12-05 NOTE — Patient Instructions (Signed)
Please make sure to do a bone density test when you do your mammogram in April, 2018.  Order has been sent to [facility].
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
## 2015-12-10 ENCOUNTER — Ambulatory Visit (HOSPITAL_COMMUNITY): Payer: BC Managed Care – PPO | Admitting: Physical Therapy

## 2015-12-10 DIAGNOSIS — M25662 Stiffness of left knee, not elsewhere classified: Secondary | ICD-10-CM | POA: Diagnosis not present

## 2015-12-10 DIAGNOSIS — R600 Localized edema: Secondary | ICD-10-CM

## 2015-12-10 DIAGNOSIS — M6281 Muscle weakness (generalized): Secondary | ICD-10-CM

## 2015-12-10 DIAGNOSIS — R262 Difficulty in walking, not elsewhere classified: Secondary | ICD-10-CM
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
## 2015-12-12 ENCOUNTER — Ambulatory Visit (HOSPITAL_COMMUNITY): Payer: BC Managed Care – PPO

## 2015-12-12 DIAGNOSIS — R262 Difficulty in walking, not elsewhere classified: Secondary | ICD-10-CM

## 2015-12-12 DIAGNOSIS — M25662 Stiffness of left knee, not elsewhere classified: Secondary | ICD-10-CM | POA: Diagnosis not present

## 2015-12-12 DIAGNOSIS — R600 Localized edema: Secondary | ICD-10-CM

## 2015-12-12 DIAGNOSIS — M6281 Muscle weakness (generalized): Secondary | ICD-10-CM

## 2015-12-12 NOTE — Patient Instructions (Signed)
Knee Extension Mobilization: Hang (Prone)    With table supporting thighs, with knee off bed. Hold 5 minutes and increase slowly as tolerated. Repeat 1-2 times per day.    http://orth.exer.us/723   Copyright  VHI. All rights reserved.
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
## 2015-12-15 ENCOUNTER — Ambulatory Visit (HOSPITAL_COMMUNITY): Payer: BC Managed Care – PPO

## 2015-12-15 DIAGNOSIS — M25662 Stiffness of left knee, not elsewhere classified: Secondary | ICD-10-CM | POA: Diagnosis not present

## 2015-12-15 DIAGNOSIS — R600 Localized edema: Secondary | ICD-10-CM

## 2015-12-15 DIAGNOSIS — M6281 Muscle weakness (generalized): Secondary | ICD-10-CM

## 2015-12-15 DIAGNOSIS — R262 Difficulty in walking, not elsewhere classified: Secondary | ICD-10-CM
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
## 2015-12-17 ENCOUNTER — Ambulatory Visit (HOSPITAL_COMMUNITY): Payer: BC Managed Care – PPO | Admitting: Physical Therapy

## 2015-12-17 ENCOUNTER — Telehealth (HOSPITAL_COMMUNITY): Payer: Self-pay | Admitting: Physical Therapy

## 2015-12-17 DIAGNOSIS — R262 Difficulty in walking, not elsewhere classified: Secondary | ICD-10-CM

## 2015-12-17 DIAGNOSIS — R600 Localized edema: Secondary | ICD-10-CM

## 2015-12-17 DIAGNOSIS — M25662 Stiffness of left knee, not elsewhere classified: Secondary | ICD-10-CM | POA: Diagnosis not present

## 2015-12-17 DIAGNOSIS — M6281 Muscle weakness (generalized): Secondary | ICD-10-CM

## 2015-12-17 NOTE — Telephone Encounter (Signed)
Gave patient a copy of JAS brace referral slip to take to MD at her appointment later this week.   Deniece Ree PT, DPT [phone]
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
## 2015-12-19 ENCOUNTER — Ambulatory Visit (HOSPITAL_COMMUNITY): Payer: BC Managed Care – PPO | Attending: Orthopedic Surgery

## 2015-12-19 DIAGNOSIS — M6281 Muscle weakness (generalized): Secondary | ICD-10-CM | POA: Insufficient documentation

## 2015-12-19 DIAGNOSIS — R262 Difficulty in walking, not elsewhere classified: Secondary | ICD-10-CM | POA: Insufficient documentation

## 2015-12-19 DIAGNOSIS — R600 Localized edema: Secondary | ICD-10-CM | POA: Insufficient documentation

## 2015-12-19 DIAGNOSIS — M25662 Stiffness of left knee, not elsewhere classified: Secondary | ICD-10-CM | POA: Insufficient documentation
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
## 2015-12-23 ENCOUNTER — Ambulatory Visit (HOSPITAL_COMMUNITY): Payer: BC Managed Care – PPO

## 2015-12-23 DIAGNOSIS — M6281 Muscle weakness (generalized): Secondary | ICD-10-CM

## 2015-12-23 DIAGNOSIS — M25662 Stiffness of left knee, not elsewhere classified: Secondary | ICD-10-CM

## 2015-12-23 DIAGNOSIS — R262 Difficulty in walking, not elsewhere classified: Secondary | ICD-10-CM

## 2015-12-23 DIAGNOSIS — R600 Localized edema: Secondary | ICD-10-CM
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
## 2015-12-24 ENCOUNTER — Telehealth (HOSPITAL_COMMUNITY): Payer: Self-pay | Admitting: Physical Therapy

## 2015-12-24 ENCOUNTER — Ambulatory Visit (HOSPITAL_COMMUNITY): Payer: BC Managed Care – PPO | Admitting: Physical Therapy

## 2015-12-24 NOTE — Telephone Encounter (Signed)
She is sick today and can not come in - l/m on voicemail

## 2015-12-26 ENCOUNTER — Ambulatory Visit (HOSPITAL_COMMUNITY): Payer: BC Managed Care – PPO

## 2015-12-26 ENCOUNTER — Telehealth (HOSPITAL_COMMUNITY): Payer: Self-pay

## 2015-12-26 NOTE — Telephone Encounter (Signed)
Pt thought her apptment was at 4pm

## 2015-12-26 NOTE — Telephone Encounter (Signed)
No show, called and spoke to pt who thought apt was schedulded at different time today.  Informed pt of next apt date and time.     [address], [place]; CBIS [phone]

## 2015-12-29 ENCOUNTER — Ambulatory Visit (HOSPITAL_COMMUNITY): Payer: BC Managed Care – PPO | Admitting: Physical Therapy

## 2015-12-29 DIAGNOSIS — R262 Difficulty in walking, not elsewhere classified: Secondary | ICD-10-CM

## 2015-12-29 DIAGNOSIS — M25662 Stiffness of left knee, not elsewhere classified: Secondary | ICD-10-CM | POA: Diagnosis not present

## 2015-12-29 DIAGNOSIS — R600 Localized edema: Secondary | ICD-10-CM

## 2015-12-29 DIAGNOSIS — M6281 Muscle weakness (generalized): Secondary | ICD-10-CM
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
## 2016-01-01 DIAGNOSIS — Z9842 Cataract extraction status, left eye: Secondary | ICD-10-CM | POA: Insufficient documentation

## 2016-01-02 ENCOUNTER — Ambulatory Visit (HOSPITAL_COMMUNITY): Payer: BC Managed Care – PPO

## 2016-01-02 DIAGNOSIS — M25662 Stiffness of left knee, not elsewhere classified: Secondary | ICD-10-CM | POA: Diagnosis not present

## 2016-01-02 DIAGNOSIS — R262 Difficulty in walking, not elsewhere classified: Secondary | ICD-10-CM

## 2016-01-02 DIAGNOSIS — M6281 Muscle weakness (generalized): Secondary | ICD-10-CM

## 2016-01-02 DIAGNOSIS — R600 Localized edema: Secondary | ICD-10-CM
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
## 2016-01-05 ENCOUNTER — Ambulatory Visit (HOSPITAL_COMMUNITY): Payer: BC Managed Care – PPO | Admitting: Physical Therapy

## 2016-01-05 DIAGNOSIS — M25662 Stiffness of left knee, not elsewhere classified: Secondary | ICD-10-CM | POA: Diagnosis not present

## 2016-01-05 DIAGNOSIS — R600 Localized edema: Secondary | ICD-10-CM

## 2016-01-05 DIAGNOSIS — M6281 Muscle weakness (generalized): Secondary | ICD-10-CM

## 2016-01-05 DIAGNOSIS — R262 Difficulty in walking, not elsewhere classified: Secondary | ICD-10-CM
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
## 2016-01-07 ENCOUNTER — Ambulatory Visit (HOSPITAL_COMMUNITY): Payer: BC Managed Care – PPO | Admitting: Physical Therapy

## 2016-01-07 DIAGNOSIS — R600 Localized edema: Secondary | ICD-10-CM

## 2016-01-07 DIAGNOSIS — M25662 Stiffness of left knee, not elsewhere classified: Secondary | ICD-10-CM

## 2016-01-07 DIAGNOSIS — M6281 Muscle weakness (generalized): Secondary | ICD-10-CM

## 2016-01-07 DIAGNOSIS — R262 Difficulty in walking, not elsewhere classified: Secondary | ICD-10-CM
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
## 2016-01-07 NOTE — Patient Instructions (Signed)
   TANDEM STANCE WITH SUPPORT  Stand in front of a chair, table or counter top for support. Then place the heel of one foot so that it is touching the toes of the other foot. Maintain your balance in this position for as long as you can, then switch sides.  Repeat 2 times each side, 1-2 times per day.     SINGLE LEG STANCE - SLS  Stand on one leg and maintain your balance.  Hold for as long as you can, then switch sides; repeat 2 times each side, 1-2 times per day.

## 2016-01-12 ENCOUNTER — Encounter (HOSPITAL_COMMUNITY): Payer: BC Managed Care – PPO | Admitting: Physical Therapy

## 2016-01-15 ENCOUNTER — Encounter (HOSPITAL_COMMUNITY): Payer: BC Managed Care – PPO | Admitting: Physical Therapy

## 2016-08-19 ENCOUNTER — Encounter: Payer: Self-pay | Admitting: Obstetrics & Gynecology

## 2017-02-09 ENCOUNTER — Encounter: Payer: Self-pay | Admitting: Obstetrics & Gynecology
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
## 2017-03-04 ENCOUNTER — Other Ambulatory Visit (HOSPITAL_COMMUNITY)
Admission: RE | Admit: 2017-03-04 | Discharge: 2017-03-04 | Disposition: A | Discharge status: Home / Self Care | Payer: BC Managed Care – PPO | Source: Ambulatory Visit | Attending: Obstetrics & Gynecology | Admitting: Obstetrics & Gynecology

## 2017-03-04 ENCOUNTER — Other Ambulatory Visit: Payer: Self-pay

## 2017-03-04 ENCOUNTER — Ambulatory Visit: Payer: BC Managed Care – PPO | Admitting: Obstetrics & Gynecology

## 2017-03-04 ENCOUNTER — Encounter: Payer: Self-pay | Admitting: Obstetrics & Gynecology

## 2017-03-04 VITALS — BP 124/86 | HR 68 | Resp 16 | Ht 66.0 in | Wt 190.0 lb

## 2017-03-04 DIAGNOSIS — Z0000 Encounter for general adult medical examination without abnormal findings: Secondary | ICD-10-CM | POA: Diagnosis not present

## 2017-03-04 DIAGNOSIS — Z124 Encounter for screening for malignant neoplasm of cervix: Secondary | ICD-10-CM | POA: Diagnosis not present

## 2017-03-04 DIAGNOSIS — Z01419 Encounter for gynecological examination (general) (routine) without abnormal findings: Secondary | ICD-10-CM | POA: Diagnosis not present

## 2017-03-04 DIAGNOSIS — M797 Fibromyalgia: Secondary | ICD-10-CM | POA: Diagnosis not present

## 2017-03-05 LAB — HEMOGLOBIN A1C
Est. average glucose Bld gHb Est-mCnc: 120 mg/dL
Hgb A1c MFr Bld: 5.8 % — ABNORMAL HIGH (ref 4.8–5.6)

## 2017-03-05 LAB — TSH: TSH: 2.24 u[IU]/mL (ref 0.450–4.500)

## 2017-03-08 LAB — CYTOLOGY - PAP
Diagnosis: NEGATIVE
HPV: NOT DETECTED

## 2017-03-17 ENCOUNTER — Other Ambulatory Visit: Payer: Self-pay | Admitting: Obstetrics & Gynecology

## 2017-03-17 ENCOUNTER — Telehealth: Payer: Self-pay | Admitting: Obstetrics & Gynecology

## 2017-03-17 DIAGNOSIS — R232 Flushing: Secondary | ICD-10-CM

## 2017-03-17 NOTE — Telephone Encounter (Signed)
Order placed for 24 hour urine by Dr.Miller. Encounter closed.

## 2017-03-17 NOTE — Telephone Encounter (Addendum)
Spoke with Loma Boston at [facility] who states the patient is at the [place] location to drop off her 24 hour urine. Order is needed.

## 2017-03-17 NOTE — Telephone Encounter (Signed)
Patient is supposed to drop off a 24 hour urine. Patient would like to have this done at the [facility] in [place]. Patient states she is at the [facility] waiting.

## 2017-03-22 LAB — 5 HIAA, QUANTITATIVE, URINE, 24 HOUR
5-HIAA, Ur: 2.5 mg/L
5-HIAA,Quant.,24 Hr Urine: 2.8 mg/24 hr (ref 0.0–14.9)

## 2017-03-23 ENCOUNTER — Telehealth: Payer: Self-pay

## 2017-03-23 NOTE — Telephone Encounter (Signed)
-----   Message from Megan Salon, MD sent at 03/23/2017  4:59 AM EST ----- Please let pt know this was normal.  I think she's just having worse hot flashes right now.  Ok to monitor and let me know if there are additional changes.  Thanks.

## 2017-03-23 NOTE — Telephone Encounter (Signed)
Spoke with patient. Results given. Patient verbalizes understanding. Encounter closed. 

## 2017-03-28 ENCOUNTER — Encounter: Payer: Self-pay | Admitting: Obstetrics & Gynecology

## 2017-03-29 ENCOUNTER — Telehealth: Payer: Self-pay

## 2017-03-29 MED ORDER — GABAPENTIN 100 MG PO CAPS: ORAL_CAPSULE | ORAL | 0 refills | Status: DC

## 2017-03-29 NOTE — Telephone Encounter (Signed)
Left message to call Katharin Schneider at [phone]. 

## 2017-03-29 NOTE — Telephone Encounter (Signed)
Telephone encounter created to review with Dr.Miller. 

## 2017-03-29 NOTE — Telephone Encounter (Signed)
Non-Urgent Medical Question  Message [id number]  From Vilma Prader To Megan Salon, MD Sent 03/28/2017 1:19 PM  Doctor Sabra Heck,  I really think I need a medication for new hot flashes. They are becoming more regular. I am miserable. I appreciate your help.   Responsible Party   Pool - [facility] Clinical Pool Message taken by Gwendlyn Deutscher, RN on 03/29/2017 1:28 PM  No actions have been taken on this message.   Patient had a 5 HIAA, quantitative, urine, 24 hour that was negative on 11/29. Patient is having worsening and more frequent hot flashes. Requesting medication for symptoms.

## 2017-03-29 NOTE — Telephone Encounter (Signed)
Spoke with patient. Advised of message as seen below from [place]. Patient would like to try Neurontin at this time. Rx for Neurontin 100 mg po x 7 days, then 200 mg po x 7 days, then 300 mg nightly #[id number] [id number] sent to pharmacy on file. Recheck scheduled for 04/20/2017 at 8:30 am with Dr.Miller. Patient is agreeable to date and time.  Routing to provider for final review. Patient agreeable to disposition. Will close encounter.

## 2017-03-29 NOTE — Telephone Encounter (Signed)
Patient returned call to Kaitlyn. °

## 2017-03-29 NOTE — Telephone Encounter (Signed)
I would really rather not start HRT with her if possible.  Would she consider neurotin 100mg  nightly x 7 days, then increase to 200mg  nightly x 7 days, then increase to 300mg  nightly.  Would need follow up in three weeks.  Thanks.

## 2017-04-01 ENCOUNTER — Encounter: Payer: Self-pay | Admitting: Obstetrics & Gynecology

## 2017-04-20 ENCOUNTER — Ambulatory Visit: Payer: BC Managed Care – PPO | Admitting: Obstetrics & Gynecology

## 2017-04-25 ENCOUNTER — Ambulatory Visit: Payer: BC Managed Care – PPO | Admitting: Obstetrics & Gynecology

## 2017-04-26 ENCOUNTER — Encounter (INDEPENDENT_AMBULATORY_CARE_PROVIDER_SITE_OTHER): Payer: Self-pay | Admitting: Internal Medicine

## 2017-05-02 ENCOUNTER — Encounter: Payer: Self-pay | Admitting: Obstetrics & Gynecology

## 2017-05-02 ENCOUNTER — Ambulatory Visit: Payer: BC Managed Care – PPO | Admitting: Obstetrics & Gynecology

## 2017-05-02 VITALS — BP 110/70 | HR 72 | Resp 16 | Ht 66.0 in | Wt 191.0 lb

## 2017-05-02 DIAGNOSIS — R232 Flushing: Secondary | ICD-10-CM | POA: Diagnosis not present

## 2017-05-02 MED ORDER — GABAPENTIN 100 MG PO CAPS: 100.0000 mg | ORAL_CAPSULE | Freq: Three times a day (TID) | ORAL | 4 refills | Status: DC

## 2017-05-02 MED ORDER — GABAPENTIN 100 MG PO CAPS: ORAL_CAPSULE | ORAL | 0 refills | Status: DC
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
## 2017-05-25 ENCOUNTER — Telehealth: Payer: Self-pay | Admitting: Obstetrics & Gynecology

## 2017-05-25 NOTE — Telephone Encounter (Signed)
Advised patient of message as seen below from [place]. Patient verbalizes understanding.   Routing to provider for final review. Patient agreeable to disposition. Will close encounter.

## 2017-05-25 NOTE — Telephone Encounter (Signed)
[facility] calling to speak with someone about patient's prescription. Phone number is [phone] and reference number is [id number].

## 2017-05-25 NOTE — Telephone Encounter (Signed)
Spoke with Scientist, clinical (histocompatibility and immunogenetics) at [employer] per [place] Gabapentin 300 mg 1 tablet daily is cheaper for the patient instead of Gabapentin 100 mg take 3 tablets daily. Advised per Dr.Miller's note on rx okay to fill as Gabapentin 300 mg take 1 tablet po daily #90 4RF. [facility] will process script.  Left message for patient to call [place] at [phone].

## 2017-05-31 ENCOUNTER — Ambulatory Visit (INDEPENDENT_AMBULATORY_CARE_PROVIDER_SITE_OTHER): Payer: BC Managed Care – PPO | Admitting: Internal Medicine

## 2017-09-05 ENCOUNTER — Telehealth: Payer: Self-pay | Admitting: *Deleted

## 2017-09-05 NOTE — Telephone Encounter (Signed)
Patient returned call to Emily. °

## 2017-09-05 NOTE — Telephone Encounter (Signed)
Message left to return call to Whitney Moreno at [phone].    

## 2017-09-05 NOTE — Telephone Encounter (Signed)
Returned call to patient. RN advised our office received refill request from [facility] for gabapentin. Advised patient that Dr. Sabra Heck had sent in a year supply to [facility] on 05/02/17. Patient states she does not need refill through [facility] prefers to use [facility] and will call for refill. Patient asking if Dr. Sabra Heck can refill her methotrexate for her? RN advised would need to contact PCP as that is the provider who has prescribed methotrexate and authorizes refills. Patient verbalized understanding and will contact PCP.   Routing to provider for final review. Patient agreeable to disposition. Will close encounter.

## 2017-09-19 ENCOUNTER — Other Ambulatory Visit: Payer: Self-pay | Admitting: Obstetrics & Gynecology

## 2017-09-19 MED ORDER — GABAPENTIN 300 MG PO CAPS: 300.0000 mg | ORAL_CAPSULE | Freq: Every day | ORAL | 2 refills | Status: DC

## 2017-09-19 NOTE — Telephone Encounter (Signed)
Patient has new insurance and will not be using [facility] any longer. She is requesting a new prescription to be sent to [facility] for her Gabapentin 300 mg.

## 2017-09-19 NOTE — Telephone Encounter (Signed)
Medication refill request: gabapentin  Last AEX:  03-04-17  Next AEX: 03-06-18  Last MMG (if hormonal medication request): 07-21-16 WNL  Refill authorized: please advise

## 2017-10-26 ENCOUNTER — Telehealth: Payer: Self-pay | Admitting: Obstetrics & Gynecology

## 2017-10-26 NOTE — Telephone Encounter (Signed)
Spoke with patient. States gabapentin no longer effective for hot flashes. Taking 300 mg nightly. Sweating when she is sitting or with activity. Patient requesting med change.   Denies any other GYN symptoms.   Last OV 05/02/17.  OV scheduled for 11/03/17 at 8:30am with Dr. Sabra Heck.   Routing to provider for final review. Patient is agreeable to disposition. Will close encounter.

## 2017-10-26 NOTE — Telephone Encounter (Signed)
Patient called requesting to speak with the nurse about continued problems with sweating. She said she may just need her medication changed.  New pharmacy: [facility]

## 2017-11-03 ENCOUNTER — Ambulatory Visit: Payer: Medicare Other | Admitting: Obstetrics & Gynecology

## 2017-11-03 ENCOUNTER — Encounter: Payer: Self-pay | Admitting: Obstetrics & Gynecology

## 2017-11-03 ENCOUNTER — Other Ambulatory Visit: Payer: Self-pay

## 2017-11-03 VITALS — BP 116/64 | HR 84 | Resp 16 | Ht 66.0 in | Wt 198.4 lb

## 2017-11-03 DIAGNOSIS — R232 Flushing: Secondary | ICD-10-CM | POA: Diagnosis not present

## 2017-11-03 DIAGNOSIS — L749 Eccrine sweat disorder, unspecified: Secondary | ICD-10-CM | POA: Diagnosis not present
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
## 2017-11-04 LAB — FOLLICLE STIMULATING HORMONE: FSH: 56.2 m[IU]/mL

## 2017-11-04 LAB — CBC WITH DIFFERENTIAL/PLATELET
Basophils Absolute: 0 10*3/uL (ref 0.0–0.2)
Basos: 1 %
EOS (ABSOLUTE): 0.4 10*3/uL (ref 0.0–0.4)
Eos: 5 %
Hematocrit: 34 % (ref 34.0–46.6)
Hemoglobin: 10.7 g/dL — ABNORMAL LOW (ref 11.1–15.9)
Immature Grans (Abs): 0 10*3/uL (ref 0.0–0.1)
Immature Granulocytes: 0 %
Lymphocytes Absolute: 2.2 10*3/uL (ref 0.7–3.1)
Lymphs: 28 %
MCH: 26.7 pg (ref 26.6–33.0)
MCHC: 31.5 g/dL (ref 31.5–35.7)
MCV: 85 fL (ref 79–97)
Monocytes Absolute: 0.9 10*3/uL (ref 0.1–0.9)
Monocytes: 12 %
Neutrophils Absolute: 4.2 10*3/uL (ref 1.4–7.0)
Neutrophils: 54 %
Platelets: 356 10*3/uL (ref 150–450)
RBC: 4.01 x10E6/uL (ref 3.77–5.28)
RDW: 17.5 % — ABNORMAL HIGH (ref 12.3–15.4)
WBC: 7.7 10*3/uL (ref 3.4–10.8)

## 2017-11-06 ENCOUNTER — Encounter: Payer: Self-pay | Admitting: Obstetrics & Gynecology

## 2017-11-08 ENCOUNTER — Encounter: Payer: Self-pay | Admitting: Obstetrics & Gynecology

## 2017-11-09 ENCOUNTER — Ambulatory Visit: Payer: Self-pay | Admitting: Orthopedic Surgery

## 2017-11-11 ENCOUNTER — Other Ambulatory Visit: Payer: Self-pay | Admitting: *Deleted

## 2017-11-11 DIAGNOSIS — R718 Other abnormality of red blood cells: Secondary | ICD-10-CM

## 2017-11-30 ENCOUNTER — Encounter

## 2017-11-30 ENCOUNTER — Ambulatory Visit: Payer: Medicare Other | Admitting: Orthopedic Surgery

## 2017-11-30 ENCOUNTER — Encounter: Payer: Self-pay | Admitting: Family

## 2017-11-30 ENCOUNTER — Inpatient Hospital Stay: Payer: Medicare Other | Attending: Hematology & Oncology

## 2017-11-30 ENCOUNTER — Inpatient Hospital Stay (HOSPITAL_BASED_OUTPATIENT_CLINIC_OR_DEPARTMENT_OTHER): Payer: Medicare Other | Admitting: Family

## 2017-11-30 ENCOUNTER — Other Ambulatory Visit: Payer: Self-pay

## 2017-11-30 ENCOUNTER — Other Ambulatory Visit: Payer: Self-pay | Admitting: Family

## 2017-11-30 VITALS — BP 132/90 | HR 77 | Temp 97.7°F | Resp 18 | Wt 196.0 lb

## 2017-11-30 DIAGNOSIS — R718 Other abnormality of red blood cells: Secondary | ICD-10-CM

## 2017-11-30 DIAGNOSIS — D649 Anemia, unspecified: Secondary | ICD-10-CM | POA: Insufficient documentation

## 2017-11-30 DIAGNOSIS — Z86011 Personal history of benign neoplasm of the brain: Secondary | ICD-10-CM

## 2017-11-30 DIAGNOSIS — M797 Fibromyalgia: Secondary | ICD-10-CM | POA: Diagnosis not present

## 2017-11-30 DIAGNOSIS — Z9013 Acquired absence of bilateral breasts and nipples: Secondary | ICD-10-CM | POA: Diagnosis not present

## 2017-11-30 DIAGNOSIS — Z809 Family history of malignant neoplasm, unspecified: Secondary | ICD-10-CM

## 2017-11-30 DIAGNOSIS — M05711 Rheumatoid arthritis with rheumatoid factor of right shoulder without organ or systems involvement: Secondary | ICD-10-CM

## 2017-11-30 DIAGNOSIS — Z87891 Personal history of nicotine dependence: Secondary | ICD-10-CM

## 2017-11-30 LAB — CBC WITH DIFFERENTIAL (CANCER CENTER ONLY)
Basophils Absolute: 0 10*3/uL (ref 0.0–0.1)
Basophils Relative: 1 %
Eosinophils Absolute: 0.3 10*3/uL (ref 0.0–0.5)
Eosinophils Relative: 3 %
HCT: 35.5 % (ref 34.8–46.6)
Hemoglobin: 11.1 g/dL — ABNORMAL LOW (ref 11.6–15.9)
Lymphocytes Relative: 36 %
Lymphs Abs: 2.9 10*3/uL (ref 0.9–3.3)
MCH: 26.5 pg (ref 26.0–34.0)
MCHC: 31.3 g/dL — ABNORMAL LOW (ref 32.0–36.0)
MCV: 84.7 fL (ref 81.0–101.0)
Monocytes Absolute: 0.9 10*3/uL (ref 0.1–0.9)
Monocytes Relative: 11 %
Neutro Abs: 3.9 10*3/uL (ref 1.5–6.5)
Neutrophils Relative %: 49 %
Platelet Count: 372 10*3/uL (ref 145–400)
RBC: 4.19 MIL/uL (ref 3.70–5.32)
RDW: 16.8 % — ABNORMAL HIGH (ref 11.1–15.7)
WBC Count: 8 10*3/uL (ref 3.9–10.0)

## 2017-11-30 LAB — SAVE SMEAR
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
## 2017-12-01 LAB — RETICULOCYTES
RBC.: 4.12 MIL/uL (ref 3.70–5.45)
Retic Count, Absolute: 37.1 10*3/uL (ref 33.7–90.7)
Retic Ct Pct: 0.9 % (ref 0.7–2.1)

## 2017-12-01 LAB — LACTATE DEHYDROGENASE: LDH: 225 U/L — ABNORMAL HIGH (ref 98–192)

## 2017-12-07 ENCOUNTER — Ambulatory Visit (INDEPENDENT_AMBULATORY_CARE_PROVIDER_SITE_OTHER): Payer: Medicare Other

## 2017-12-07 ENCOUNTER — Ambulatory Visit: Payer: Medicare Other | Admitting: Orthopedic Surgery

## 2017-12-07 ENCOUNTER — Encounter: Payer: Self-pay | Admitting: Orthopedic Surgery

## 2017-12-07 VITALS — BP 138/84 | HR 77 | Ht 66.0 in | Wt 197.0 lb

## 2017-12-07 DIAGNOSIS — Z96652 Presence of left artificial knee joint: Secondary | ICD-10-CM

## 2017-12-07 DIAGNOSIS — M25561 Pain in right knee: Secondary | ICD-10-CM

## 2017-12-07 DIAGNOSIS — G8929 Other chronic pain: Secondary | ICD-10-CM | POA: Diagnosis not present

## 2017-12-07 DIAGNOSIS — M05711 Rheumatoid arthritis with rheumatoid factor of right shoulder without organ or systems involvement: Secondary | ICD-10-CM

## 2017-12-07 DIAGNOSIS — M1711 Unilateral primary osteoarthritis, right knee: Secondary | ICD-10-CM
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
## 2018-03-06 ENCOUNTER — Ambulatory Visit: Payer: Medicare Other | Admitting: Obstetrics & Gynecology

## 2018-03-08 ENCOUNTER — Ambulatory Visit: Payer: Medicare Other | Admitting: Orthopedic Surgery

## 2018-04-24 ENCOUNTER — Ambulatory Visit: Payer: Medicare Other | Admitting: Orthopedic Surgery

## 2018-04-24 ENCOUNTER — Encounter: Payer: Self-pay | Admitting: Orthopedic Surgery

## 2018-04-24 ENCOUNTER — Ambulatory Visit (INDEPENDENT_AMBULATORY_CARE_PROVIDER_SITE_OTHER): Payer: Medicare Other

## 2018-04-24 VITALS — BP 107/66 | HR 82 | Ht 66.0 in | Wt 197.0 lb

## 2018-04-24 DIAGNOSIS — Z96652 Presence of left artificial knee joint: Secondary | ICD-10-CM

## 2018-04-24 DIAGNOSIS — M25562 Pain in left knee: Secondary | ICD-10-CM

## 2018-04-24 DIAGNOSIS — G8929 Other chronic pain: Secondary | ICD-10-CM

## 2018-04-24 DIAGNOSIS — M1710 Unilateral primary osteoarthritis, unspecified knee: Secondary | ICD-10-CM

## 2018-04-24 NOTE — Progress Notes (Signed)
Progress Note   Patient ID: Whitney Moreno, female   DOB: 05/08/1950, 68 y.o.   MRN: [id number]   Chief Complaint  Patient presents with  . Routine Post Op    Left knee 53/66    68 year old female status post left total knee 2017 at [facility] history of osteoarthritis with x-rays in 2019 at our office showing osteoarthritis of the right knee presents for routine follow-up and x-ray of her implant which she would like [place] to follow since [place] closer to her [place]    Review of Systems  Musculoskeletal: Negative for joint pain.     No Known Allergies   BP 107/66   Pulse 82   Ht 5\' 6"  (1.676 m)   Wt 197 lb (89.4 kg)   LMP 02/16/2008   BMI 31.80 kg/m   Physical Exam Vitals signs reviewed.  Constitutional:      Appearance: She is well-developed.  Musculoskeletal:       Legs:  Neurological:     Mental Status: She is alert and oriented to person, place, and time.  Psychiatric:        Attention and Perception: Attention normal.        Mood and Affect: Mood and affect normal.        Speech: Speech normal.        Behavior: Behavior normal.        Thought Content: Thought content normal.        Judgment: Judgment normal.      Medical decisions:   Data  Imaging:   X-ray left knee today shows a well fixed well aligned Fortescue stemmed tibia total knee with no loosening  Encounter Diagnoses  Name Primary?  . Chronic pain of left knee   . Primary localized osteoarthritis of knee right    . History of left knee replacement 2017 at [facility] Yes    PLAN:   Patient will follow-up as needed specifically regarding the right knee which has osteoarthritis but she is comfortable now not having any difficulty and does not want to proceed with any surgery her last x-ray was 2019 it showed varus alignment joint space narrowing moderate to severe mild secondary bone changes of the right knee    Arther Abbott, MD 04/24/2018 9:02 AM

## 2018-05-26 ENCOUNTER — Ambulatory Visit: Payer: Medicare Other | Admitting: Obstetrics & Gynecology

## 2018-06-06 ENCOUNTER — Other Ambulatory Visit: Payer: Self-pay

## 2018-06-06 ENCOUNTER — Ambulatory Visit (INDEPENDENT_AMBULATORY_CARE_PROVIDER_SITE_OTHER): Payer: Medicare Other | Admitting: Obstetrics & Gynecology

## 2018-06-06 ENCOUNTER — Encounter: Payer: Self-pay | Admitting: Obstetrics & Gynecology

## 2018-06-06 VITALS — BP 108/70 | HR 72 | Resp 16 | Ht 66.0 in | Wt 196.0 lb

## 2018-06-06 DIAGNOSIS — Z23 Encounter for immunization: Secondary | ICD-10-CM

## 2018-06-06 DIAGNOSIS — Z01419 Encounter for gynecological examination (general) (routine) without abnormal findings: Secondary | ICD-10-CM

## 2018-06-06 DIAGNOSIS — R718 Other abnormality of red blood cells: Secondary | ICD-10-CM

## 2018-06-06 DIAGNOSIS — Z9081 Acquired absence of spleen: Secondary | ICD-10-CM | POA: Diagnosis not present

## 2018-06-06 DIAGNOSIS — R7303 Prediabetes: Secondary | ICD-10-CM | POA: Diagnosis not present

## 2018-06-06 MED ORDER — GABAPENTIN 300 MG PO CAPS: 300.0000 mg | ORAL_CAPSULE | Freq: Every day | ORAL | 4 refills | Status: DC
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
## 2018-06-07 LAB — CBC WITH DIFFERENTIAL/PLATELET
Basophils Absolute: 0.1 10*3/uL (ref 0.0–0.2)
Basos: 1 %
EOS (ABSOLUTE): 0.2 10*3/uL (ref 0.0–0.4)
Eos: 4 %
Hematocrit: 34 % (ref 34.0–46.6)
Hemoglobin: 10.8 g/dL — ABNORMAL LOW (ref 11.1–15.9)
Immature Grans (Abs): 0 10*3/uL (ref 0.0–0.1)
Immature Granulocytes: 0 %
Lymphocytes Absolute: 1.9 10*3/uL (ref 0.7–3.1)
Lymphs: 31 %
MCH: 26.6 pg (ref 26.6–33.0)
MCHC: 31.8 g/dL (ref 31.5–35.7)
MCV: 84 fL (ref 79–97)
Monocytes Absolute: 0.7 10*3/uL (ref 0.1–0.9)
Monocytes: 11 %
Neutrophils Absolute: 3.3 10*3/uL (ref 1.4–7.0)
Neutrophils: 53 %
Platelets: 403 10*3/uL (ref 150–450)
RBC: 4.06 x10E6/uL (ref 3.77–5.28)
RDW: 17 % — ABNORMAL HIGH (ref 11.7–15.4)
WBC: 6.2 10*3/uL (ref 3.4–10.8)

## 2018-06-07 LAB — COMPREHENSIVE METABOLIC PANEL
ALT: 21 IU/L (ref 0–32)
AST: 25 IU/L (ref 0–40)
Albumin/Globulin Ratio: 1.8 (ref 1.2–2.2)
Albumin: 4.3 g/dL (ref 3.8–4.8)
Alkaline Phosphatase: 83 IU/L (ref 39–117)
BUN/Creatinine Ratio: 18 (ref 12–28)
BUN: 13 mg/dL (ref 8–27)
Bilirubin Total: <0.2 mg/dL (ref 0.0–1.2)
CO2: 24 mmol/L (ref 20–29)
Calcium: 9.5 mg/dL (ref 8.7–10.3)
Chloride: 102 mmol/L (ref 96–106)
Creatinine, Ser: 0.72 mg/dL (ref 0.57–1.00)
GFR calc Af Amer: 102 mL/min/{1.73_m2} (ref >59)
GFR calc non Af Amer: 88 mL/min/{1.73_m2} (ref >59)
Globulin, Total: 2.4 g/dL (ref 1.5–4.5)
Glucose: 87 mg/dL (ref 65–99)
Potassium: 4.7 mmol/L (ref 3.5–5.2)
Sodium: 142 mmol/L (ref 134–144)
Total Protein: 6.7 g/dL (ref 6.0–8.5)

## 2018-06-07 LAB — HEMOGLOBIN A1C
Est. average glucose Bld gHb Est-mCnc: 126 mg/dL
Hgb A1c MFr Bld: 6 % — ABNORMAL HIGH (ref 4.8–5.6)

## 2018-09-21 ENCOUNTER — Encounter: Payer: Self-pay | Admitting: Obstetrics & Gynecology

## 2018-12-18 ENCOUNTER — Other Ambulatory Visit: Payer: Self-pay

## 2018-12-18 DIAGNOSIS — Z20822 Contact with and (suspected) exposure to covid-19: Secondary | ICD-10-CM

## 2018-12-20 LAB — NOVEL CORONAVIRUS, NAA: SARS-CoV-2, NAA: NOT DETECTED

## 2019-03-07 ENCOUNTER — Ambulatory Visit: Payer: Medicare Other | Admitting: Orthopedic Surgery

## 2019-03-07 ENCOUNTER — Ambulatory Visit: Payer: Medicare Other

## 2019-03-07 ENCOUNTER — Other Ambulatory Visit: Payer: Self-pay

## 2019-03-07 VITALS — BP 137/78 | HR 70 | Temp 97.2°F | Ht 66.0 in | Wt 190.0 lb

## 2019-03-07 DIAGNOSIS — M25522 Pain in left elbow: Secondary | ICD-10-CM

## 2019-03-07 DIAGNOSIS — M7022 Olecranon bursitis, left elbow: Secondary | ICD-10-CM | POA: Diagnosis not present
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
## 2019-03-07 NOTE — Patient Instructions (Signed)
Elbow Bursitis  Bursitis is swelling and pain at the tip of the elbow. This happens when fluid builds up in a sac under the skin (bursa). This may also be called olecranon bursitis. What are the causes? Elbow bursitis may be caused by:  Elbow injury, such as falling onto the elbow.  Leaning on hard surfaces for long periods of time.  Infection from an injury that breaks the skin near the elbow.  A bone growth (spur) that forms at the tip of the elbow.  A medical condition that causes inflammation, such as gout or rheumatoid arthritis. Sometimes the cause is not known. What are the signs or symptoms? The first sign of elbow bursitis is usually swelling at the tip of the elbow. This can grow to be about the size of a golf ball. Swelling may start suddenly or develop gradually. Other symptoms may include:  Pain when bending or leaning on the elbow.  Not being able to move the elbow normally. If bursitis is caused by an infection, you may have:  Redness, warmth, and tenderness of the elbow.  Drainage of pus from the swollen area over the elbow, if the skin breaks open. How is this diagnosed? This condition may be diagnosed based on:  Your symptoms and medical history.  Any recent injuries you have had.  A physical exam.  X-rays to check for a bone spur or fracture.  Draining fluid from the bursa to test it for infection.  Blood tests to rule out gout or rheumatoid arthritis. How is this treated? Treatment for elbow bursitis depends on the cause. Treatment may include:  Medicines. These may include: ? Over-the-counter medicines to relieve pain and inflammation. ? Antibiotic medicines. ? Injections of anti-inflammatory medicines (steroids).  Draining fluid from the bursa.  Wrapping your elbow with a bandage.  Wearing elbow pads. If these treatments do not help, you may need surgery to remove the bursa. Follow these instructions at home: Medicines  Take  over-the-counter and prescription medicines only as told by your health care provider.  If you were prescribed an antibiotic medicine, take it as told by your health care provider. Do not stop taking the antibiotic even if you start to feel better. Managing pain, stiffness, and swelling   If directed, put ice on your elbow: ? Put ice in a plastic bag. ? Place a towel between your skin and the bag. ? Leave the ice on for 20 minutes, 2-3 times a day.  If your bursitis is caused by an injury, rest your elbow and wear your bandage as told by your health care provider.  Use elbow pads or elbow wraps to cushion your elbow as needed. General instructions  Avoid any activities that cause elbow pain. Ask your health care provider what activities are safe for you.  Keep all follow-up visits as told by your health care provider. This is important. Contact a health care provider if you have:  A fever.  Symptoms that do not get better with treatment.  Pain or swelling that: ? Gets worse. ? Goes away and then comes back.  Pus draining from your elbow. Get help right away if you have:  Trouble moving your arm, hand, or fingers. Summary  Elbow bursitis is inflammation of the fluid-filled sac (bursa) between the tip of your elbow bone (olecranon) and your skin.  Treatment for elbow bursitis depends on the cause. It may include medicines to relieve pain and inflammation, antibiotic medicines, and draining fluid from your elbow.    Contact a health care provider if your symptoms do not get better with treatment, or if your symptoms go away and then come back. This information is not intended to replace advice given to you by your health care provider. Make sure you discuss any questions you have with your health care provider. Document Released: 05/05/2006 Document Revised: 03/18/2017 Document Reviewed: 03/15/2017 Elsevier Patient Education  2020 Elsevier Inc.  

## 2019-07-20 ENCOUNTER — Other Ambulatory Visit: Payer: Self-pay | Admitting: Obstetrics & Gynecology

## 2019-08-14 ENCOUNTER — Other Ambulatory Visit: Payer: Self-pay | Admitting: Obstetrics & Gynecology

## 2019-08-14 NOTE — Telephone Encounter (Signed)
Medication refill request: Gabapentin Last AEX:  06/06/18 SM Next AEX: 10/19/19 Last MMG (if hormonal medication request): 09/19/18 BIRADS 2 benign Refill authorized: Please advise on refill; Order pended #90 w/0 refills if authorized

## 2019-08-31 ENCOUNTER — Other Ambulatory Visit: Payer: Self-pay

## 2019-09-03 ENCOUNTER — Ambulatory Visit: Payer: Self-pay | Admitting: Obstetrics & Gynecology
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
## 2019-09-04 ENCOUNTER — Encounter: Payer: Self-pay | Admitting: Obstetrics & Gynecology

## 2019-09-07 ENCOUNTER — Ambulatory Visit (INDEPENDENT_AMBULATORY_CARE_PROVIDER_SITE_OTHER): Payer: Medicare PPO | Admitting: Obstetrics & Gynecology

## 2019-09-07 ENCOUNTER — Encounter: Payer: Self-pay | Admitting: Obstetrics & Gynecology

## 2019-09-07 ENCOUNTER — Other Ambulatory Visit (HOSPITAL_COMMUNITY)
Admission: RE | Admit: 2019-09-07 | Discharge: 2019-09-07 | Disposition: A | Discharge status: Home / Self Care | Payer: Medicare PPO | Source: Ambulatory Visit | Attending: Obstetrics & Gynecology | Admitting: Obstetrics & Gynecology

## 2019-09-07 ENCOUNTER — Other Ambulatory Visit: Payer: Self-pay

## 2019-09-07 VITALS — BP 122/70 | HR 76 | Temp 97.2°F | Ht 66.0 in | Wt 198.0 lb

## 2019-09-07 DIAGNOSIS — N852 Hypertrophy of uterus: Secondary | ICD-10-CM

## 2019-09-07 DIAGNOSIS — Z124 Encounter for screening for malignant neoplasm of cervix: Secondary | ICD-10-CM

## 2019-09-07 DIAGNOSIS — Z1211 Encounter for screening for malignant neoplasm of colon: Secondary | ICD-10-CM | POA: Diagnosis not present

## 2019-09-07 MED ORDER — GABAPENTIN 100 MG PO CAPS
ORAL_CAPSULE | ORAL | 4 refills | Status: DC
Start: 2019-09-07 — End: 2020-06-19
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
## 2019-09-10 ENCOUNTER — Telehealth: Payer: Self-pay | Admitting: Obstetrics & Gynecology

## 2019-09-10 NOTE — Telephone Encounter (Signed)
Patient is returning call to Hayley. 

## 2019-09-10 NOTE — Telephone Encounter (Signed)
Spoke with patient regarding benefits for recommended ultrasound. Patient is aware that ultrasound is transvaginal. Patient acknowledges understanding of information presented. Patient is aware of cancellation policy.

## 2019-09-10 NOTE — Telephone Encounter (Signed)
Call to patient. Per DPR, OK to leave message on voicemail.  Left voicemail requesting a return call to Hayley to review benefits and schedule recommended Pelvic ultrasound with M. Suzanne Miller, MD 

## 2019-09-11 LAB — CYTOLOGY - PAP: Diagnosis: NEGATIVE

## 2019-09-12 ENCOUNTER — Telehealth: Payer: Self-pay

## 2019-09-12 NOTE — Telephone Encounter (Signed)
RE: [facility] vaccine Received: Today Message Contents  Megan Salon, MD  Georgia Lopes, RN  Can you let pt know this information and document in a phone note? I think she needs to follow up with her PCP for these vaccinations. Thanks.   Vinnie Level     ----- Message -----  From: Georgia Lopes, RN  Sent: 09/10/2019  5:22 PM EDT  To: Megan Salon, MD  Subject: RE: [facility] vaccine           [facility]. Will not give pt vaccines due to having medicare.  Pt can have vaccines with order at [facility] in [place], [place]. Unsure at [facility].    Colletta Maryland    ----- Message -----  From: Megan Salon, MD  Sent: 09/07/2019  4:36 PM EDT  To: Georgia Lopes, RN  Subject: [facility] vaccine             Colletta Maryland,  This pt does not have a spleen and needs some vaccine updates. She needs the meningococcus vaccine, HIB and pneumovax. Can you see if they have these all at the [facility] in [place]? Thanks.   Vinnie Level

## 2019-09-12 NOTE — Telephone Encounter (Signed)
Left message for pt to return call to triage RN. 

## 2019-09-13 NOTE — Telephone Encounter (Signed)
Spoke with pt. Pt given update on vaccinations and recommendations per Dr Sabra Heck. Pt agreeable and will call PCP and schedule. Pt given names of recommended vaccines. Pt verbalized  understanding and is agreeable.  Routing to Dr Sabra Heck for review. Encounter closed.

## 2019-09-13 NOTE — Telephone Encounter (Signed)
Patient returned call

## 2019-09-18 ENCOUNTER — Telehealth: Payer: Self-pay

## 2019-09-18 NOTE — Telephone Encounter (Signed)
Spoke with pt. Pt called office to cancel PUS due to family emergency out of town. Pt rescheduled with next available appt  for PUS on 10/04/19 at 1pm and OV consult to follow with Dr Sabra Heck. Pt agreeable and verbalized understanding.   Routing to Dr Sabra Heck for review.  Encounter closed.

## 2019-09-18 NOTE — Telephone Encounter (Signed)
Patient called to cancel [place] appointment for (09/20/19) due to emergency. Patient would like to reschedule.

## 2019-09-20 ENCOUNTER — Other Ambulatory Visit: Payer: Medicare PPO

## 2019-09-20 ENCOUNTER — Other Ambulatory Visit: Payer: Medicare PPO | Admitting: Obstetrics & Gynecology

## 2019-09-25 DIAGNOSIS — Z0389 Encounter for observation for other suspected diseases and conditions ruled out: Secondary | ICD-10-CM | POA: Diagnosis not present

## 2019-09-25 DIAGNOSIS — Z1231 Encounter for screening mammogram for malignant neoplasm of breast: Secondary | ICD-10-CM | POA: Diagnosis not present

## 2019-10-02 ENCOUNTER — Telehealth: Payer: Self-pay

## 2019-10-02 NOTE — Telephone Encounter (Signed)
Opened in error

## 2019-10-03 ENCOUNTER — Encounter: Payer: Self-pay | Admitting: Obstetrics & Gynecology

## 2019-10-03 ENCOUNTER — Telehealth: Payer: Self-pay | Admitting: Obstetrics & Gynecology

## 2019-10-03 NOTE — Telephone Encounter (Signed)
PUS for enlarged uterus.   Spoke with patient. Patient request to reschedule PUS due to death in the family. Patient request to reschedule in July.   PUS scheduled for 10/25/19 at 2pm, consult to follow at 2:30pm.   Routing to provider for final review. Patient is agreeable to disposition. Will close encounter.  Cc: Thora Lance

## 2019-10-03 NOTE — Telephone Encounter (Signed)
I called patient to confirm appointment for PUS appointment and she said " I need to reschedule, I am having an emergency". No further details given. To triage to reschedule, cancel and reschedule without 48 hours notice policy followed.

## 2019-10-04 ENCOUNTER — Other Ambulatory Visit: Payer: Medicare PPO | Admitting: Obstetrics & Gynecology

## 2019-10-04 ENCOUNTER — Other Ambulatory Visit: Payer: Medicare PPO

## 2019-10-08 ENCOUNTER — Telehealth: Payer: Self-pay

## 2019-10-08 NOTE — Telephone Encounter (Signed)
Patient returned call

## 2019-10-08 NOTE — Telephone Encounter (Signed)
Patient notified of results. See lab 

## 2019-10-08 NOTE — Telephone Encounter (Signed)
Bone density is normal & follow up in 6yrs.  Left message for callback.

## 2019-10-09 ENCOUNTER — Other Ambulatory Visit: Payer: Self-pay | Admitting: *Deleted

## 2019-10-09 ENCOUNTER — Encounter: Payer: Self-pay | Admitting: Internal Medicine

## 2019-10-09 DIAGNOSIS — Z1211 Encounter for screening for malignant neoplasm of colon: Secondary | ICD-10-CM

## 2019-10-10 ENCOUNTER — Telehealth: Payer: Self-pay | Admitting: Obstetrics & Gynecology

## 2019-10-19 ENCOUNTER — Ambulatory Visit: Payer: Medicare Other | Admitting: Obstetrics & Gynecology

## 2019-10-25 ENCOUNTER — Ambulatory Visit: Payer: Medicare PPO | Admitting: Obstetrics & Gynecology

## 2019-10-25 ENCOUNTER — Ambulatory Visit (INDEPENDENT_AMBULATORY_CARE_PROVIDER_SITE_OTHER): Payer: Medicare PPO

## 2019-10-25 ENCOUNTER — Encounter: Payer: Self-pay | Admitting: Obstetrics & Gynecology

## 2019-10-25 ENCOUNTER — Other Ambulatory Visit: Payer: Self-pay

## 2019-10-25 VITALS — BP 118/80 | HR 68 | Resp 16 | Wt 193.0 lb

## 2019-10-25 DIAGNOSIS — N852 Hypertrophy of uterus: Secondary | ICD-10-CM | POA: Diagnosis not present

## 2019-10-25 NOTE — Progress Notes (Signed)
69 y.o. [id number] Married Other or two or more races female here for pelvic ultrasound due to enlarged uterus noted on exam.  Denies vaginal bleeding.  Pt has been referred for colonoscopy to provider in [place].  Pt made aware referral was initially denied as pt had seen provider in [place].  Also, pt is aware that vaccinations could not be all done at the health department so she is going to follow up with PCP about having vaccinations updated due to hx of splenectomy.  Patient's last menstrual period was 02/16/2008.  Contraception: PMP  Findings:  UTERUS: 7.0 x 2.5 x 3.1cm EMS: 2.25mm, symnmetrical ADNEXA: Left ovary: 2.5 x 1.2 x 0.8cm       Right ovary: 2.3 x 1.6 x 0.6cm CUL DE SAC: no free fluid  Discussion:  Pt aware ultrasound showed normal findings.  She is PMP.  Ovaries are normal.  Likely stool was contributing to finding on physical exam.  No additional imaging recommended.  Assessment:  PMP with enlarged uterus noted on physical exam with normal ultrasound findings  Plan:  Pt will return for AEX next year or sooner if needed for any concerns/new problems

## 2019-11-12 DIAGNOSIS — M546 Pain in thoracic spine: Secondary | ICD-10-CM | POA: Diagnosis not present

## 2019-11-12 DIAGNOSIS — M9905 Segmental and somatic dysfunction of pelvic region: Secondary | ICD-10-CM | POA: Diagnosis not present

## 2019-11-12 DIAGNOSIS — M9903 Segmental and somatic dysfunction of lumbar region: Secondary | ICD-10-CM | POA: Diagnosis not present

## 2019-11-12 DIAGNOSIS — M545 Low back pain: Secondary | ICD-10-CM | POA: Diagnosis not present

## 2019-11-12 DIAGNOSIS — M9902 Segmental and somatic dysfunction of thoracic region: Secondary | ICD-10-CM | POA: Diagnosis not present

## 2019-11-15 ENCOUNTER — Other Ambulatory Visit: Payer: Self-pay | Admitting: Obstetrics & Gynecology

## 2019-11-19 ENCOUNTER — Encounter: Payer: Self-pay | Admitting: Obstetrics & Gynecology

## 2019-11-20 ENCOUNTER — Other Ambulatory Visit: Payer: Self-pay | Admitting: Obstetrics & Gynecology

## 2019-11-21 ENCOUNTER — Other Ambulatory Visit: Payer: Self-pay | Admitting: Obstetrics & Gynecology

## 2019-11-21 DIAGNOSIS — N852 Hypertrophy of uterus: Secondary | ICD-10-CM

## 2019-11-22 NOTE — Telephone Encounter (Signed)
Please let pt know this rx refill came to me but I do not write this for her and this was an error.  Just want her to know why I'm denying it.  Thanks.

## 2019-11-22 NOTE — Telephone Encounter (Signed)
Medication refill request: Methotrexate 2.5mg   Last OV: 10/25/19 Next OV: 11/10/20 Last MMG (if hormonal medication request): 09/19/18  Normal  Refill authorized: 72/0

## 2019-11-23 NOTE — Telephone Encounter (Signed)
Called patient and left message on identifying VM the reason the prescription was denied and told her to have the pharmacy call the prescribing physicians office.   Ambrea Hegler Mindi Junker

## 2019-11-28 DIAGNOSIS — M9905 Segmental and somatic dysfunction of pelvic region: Secondary | ICD-10-CM | POA: Diagnosis not present

## 2019-11-28 DIAGNOSIS — M9903 Segmental and somatic dysfunction of lumbar region: Secondary | ICD-10-CM | POA: Diagnosis not present

## 2019-11-28 DIAGNOSIS — M545 Low back pain: Secondary | ICD-10-CM | POA: Diagnosis not present

## 2019-11-28 DIAGNOSIS — M546 Pain in thoracic spine: Secondary | ICD-10-CM | POA: Diagnosis not present

## 2019-11-28 DIAGNOSIS — M9902 Segmental and somatic dysfunction of thoracic region: Secondary | ICD-10-CM | POA: Diagnosis not present

## 2019-11-30 DIAGNOSIS — M546 Pain in thoracic spine: Secondary | ICD-10-CM | POA: Diagnosis not present

## 2019-11-30 DIAGNOSIS — M9903 Segmental and somatic dysfunction of lumbar region: Secondary | ICD-10-CM | POA: Diagnosis not present

## 2019-11-30 DIAGNOSIS — M9902 Segmental and somatic dysfunction of thoracic region: Secondary | ICD-10-CM | POA: Diagnosis not present

## 2019-11-30 DIAGNOSIS — M9905 Segmental and somatic dysfunction of pelvic region: Secondary | ICD-10-CM | POA: Diagnosis not present

## 2019-11-30 DIAGNOSIS — M545 Low back pain: Secondary | ICD-10-CM | POA: Diagnosis not present
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
## 2019-12-05 ENCOUNTER — Ambulatory Visit: Payer: Medicare PPO | Admitting: Gastroenterology

## 2019-12-05 ENCOUNTER — Other Ambulatory Visit: Payer: Self-pay

## 2019-12-05 ENCOUNTER — Encounter: Payer: Self-pay | Admitting: Gastroenterology

## 2019-12-05 VITALS — BP 118/72 | HR 75 | Temp 97.2°F | Ht 66.0 in | Wt 197.4 lb

## 2019-12-05 DIAGNOSIS — Z1211 Encounter for screening for malignant neoplasm of colon: Secondary | ICD-10-CM | POA: Insufficient documentation

## 2019-12-05 DIAGNOSIS — K5900 Constipation, unspecified: Secondary | ICD-10-CM | POA: Diagnosis not present

## 2019-12-05 DIAGNOSIS — D649 Anemia, unspecified: Secondary | ICD-10-CM | POA: Insufficient documentation

## 2019-12-05 NOTE — Patient Instructions (Signed)
Please have labs completed at [facility] lab.  Please let me know if you have any trouble.  Stop Senokot and try Linzess for constipation.  We will provide you with samples of Linzess 145 mcg for constipation.  Take 1 capsule daily 30 minutes before your first meal.  Please call in 1 week with a progress report.  If this medication works well, I will send in a prescription to your pharmacy.  Please try increasing your water intake.  We will plan to see back in 2-3 months to discuss scheduling your colonoscopy.  Aliene Altes, PA-C [facility]

## 2019-12-05 NOTE — Assessment & Plan Note (Addendum)
69 year old female reporting chronic history of constipation.  Currently taking pills for Senokot daily producing [place] 2 stools daily.  No alarm symptoms.  Due for colonoscopy.  Reports last colonoscopy 5 years ago in [place] with poor prep with Dr. Collene Mares.  We will hold off on colonoscopy for now and focus on management of constipation to ensure good prep in the future.  Plan: Trial Linzess 145 mcg daily 30 minutes before first meal.  Samples provided. Requested progress report in 1 week. Follow-up in 2-3 months to discuss scheduling colonoscopy.

## 2019-12-05 NOTE — Assessment & Plan Note (Addendum)
History of mild normocytic anemia at least dating back to 2017.  Appears baseline hemoglobin in the upper 10 range.  No iron panel, B12, or folate on file.  No overt GI bleeding or other obvious blood loss.  Currently due for colonoscopy.  Last colonoscopy about 5 years ago in [place] with Dr. Collene Mares with poor prep.  History of chronic constipation not adequately controlled.   Anemia could be secondary to chronic disease in the setting rheumatoid arthritis and fibromyalgia.  Cannot rule out occult GI blood loss.  Plan: Check iron panel, B12, and folate. Further management of constipation as per above. Holding off on scheduling colonoscopy for now while we focus on managing constipation.  If she has evidence of iron deficiency, would consider also adding upper endoscopy. Follow-up in 2-3 months.

## 2019-12-05 NOTE — Assessment & Plan Note (Signed)
Due for colon cancer screening.  Patient reports colonoscopy 5 years ago in [place] with Dr. Collene Mares with poor prep.  Not sure about any polyps.  History of chronic constipation not adequately controlled as per above.  We will hold off on scheduling colonoscopy for now and focus on further management of constipation.  Plan to follow-up in 2 to 3 months to discuss scheduling colonoscopy.

## 2019-12-09 ENCOUNTER — Telehealth: Payer: Self-pay | Admitting: Gastroenterology

## 2019-12-09 NOTE — Telephone Encounter (Signed)
Received and reviewed colonoscopy report dated 07/15/2014 at [place] with Dr. Collene Mares.   Findings: The entire examined portion of the colon appeared normal.  No additional abnormalities were found on retroflexion.  A lot of residual stool in the colon-small polyps could be missed.  Recommendations: Repeat colonoscopy in 5 years.   Alicia: Please let patient know I received and reviewed her colonoscopy report completed with Dr. Collene Mares. Overall exam appeared normal but she had a lot of residual stool in her colon so polyps could have been missed. She is due for colonoscopy. As we discussed in the office, we will hold off on scheduling while we focus on managing constipation.   Please see how she is responding to Linzess 145 mcg samples.  Please also remind her to have labs completed.

## 2019-12-11 DIAGNOSIS — Z79899 Other long term (current) drug therapy: Secondary | ICD-10-CM | POA: Diagnosis not present

## 2019-12-11 DIAGNOSIS — D649 Anemia, unspecified: Secondary | ICD-10-CM | POA: Diagnosis not present

## 2019-12-11 NOTE — Telephone Encounter (Signed)
Yes, we have samples of Linzess 290 mcg.

## 2019-12-11 NOTE — Telephone Encounter (Signed)
Spoke with pt. Pt was notified that colonoscopy report was reviewed. Pt has been taking Linzess 145 mcg 30 mins before breakfast and has only had two bowel movements. Pt reports no abdominal pain but feels some rumbling in her stomach per pt. Pt hasn't had any diarrhea as well. Pt is aware that the constipation has to be controlled prior to having a colonoscopy.

## 2019-12-11 NOTE — Telephone Encounter (Signed)
Noted. Pt is aware and will stop by the office to pick Samples of Linzess 290 mcg up. Pt will call with a progress report.

## 2019-12-11 NOTE — Telephone Encounter (Signed)
Noted. We can try increasing Linzess to 290 mcg daily. Do we have any samples?

## 2019-12-11 NOTE — Telephone Encounter (Signed)
Ok, let's try Linzess 290 mcg daily 30 min before breakfast. Please provide samples. She should call in 2 weeks with progress report. If this works well, I will send in a prescription.

## 2019-12-12 LAB — IRON,TIBC AND FERRITIN PANEL
%SAT: 44 % (calc) (ref 16–45)
Ferritin: 18 ng/mL (ref 16–288)
Iron: 175 ug/dL — ABNORMAL HIGH (ref 45–160)
TIBC: 402 mcg/dL (calc) (ref 250–450)

## 2019-12-12 LAB — VITAMIN B12: Vitamin B-12: 705 pg/mL (ref 200–1100)

## 2019-12-12 LAB — FOLATE: Folate: >24 ng/mL

## 2019-12-12 NOTE — Progress Notes (Signed)
B12 and folate within  normal limits. Iron panel doesn't suggest classic iron deficiency and her total iron is actually slightly elevated. She should continue to follow with PCP for anemia.   We will continue with plans for managing constipation and hopefully schedule colonoscopy at her next appointment.

## 2019-12-25 ENCOUNTER — Telehealth: Payer: Self-pay | Admitting: Internal Medicine

## 2019-12-25 ENCOUNTER — Other Ambulatory Visit: Payer: Self-pay | Admitting: Gastroenterology

## 2019-12-25 DIAGNOSIS — K5900 Constipation, unspecified: Secondary | ICD-10-CM

## 2019-12-25 MED ORDER — LINACLOTIDE 290 MCG PO CAPS: 290.0000 ug | ORAL_CAPSULE | Freq: Every day | ORAL | 5 refills | Status: DC

## 2019-12-25 NOTE — Telephone Encounter (Signed)
Patient called and said the linzess samples worked and would like a prescription sent to [facility]

## 2019-12-25 NOTE — Telephone Encounter (Signed)
Rx sent 

## 2019-12-25 NOTE — Telephone Encounter (Signed)
Noted  

## 2019-12-25 NOTE — Telephone Encounter (Signed)
Pt was given samples of Linzess 290 MCG. They have worked well for pt and she would like it sent to her pharmacy.

## 2019-12-26 ENCOUNTER — Ambulatory Visit: Payer: Medicare PPO | Admitting: Podiatry

## 2019-12-26 ENCOUNTER — Other Ambulatory Visit: Payer: Self-pay | Admitting: Podiatry

## 2019-12-26 ENCOUNTER — Ambulatory Visit (INDEPENDENT_AMBULATORY_CARE_PROVIDER_SITE_OTHER): Payer: Medicare PPO

## 2019-12-26 ENCOUNTER — Encounter: Payer: Self-pay | Admitting: Podiatry

## 2019-12-26 ENCOUNTER — Other Ambulatory Visit: Payer: Self-pay

## 2019-12-26 DIAGNOSIS — G629 Polyneuropathy, unspecified: Secondary | ICD-10-CM

## 2019-12-26 DIAGNOSIS — M79672 Pain in left foot: Secondary | ICD-10-CM | POA: Diagnosis not present

## 2019-12-26 DIAGNOSIS — M778 Other enthesopathies, not elsewhere classified: Secondary | ICD-10-CM | POA: Diagnosis not present

## 2019-12-26 DIAGNOSIS — M79671 Pain in right foot: Secondary | ICD-10-CM

## 2019-12-26 NOTE — Progress Notes (Signed)
Subjective:   Patient ID: Whitney Moreno, female   DOB: 69 y.o.   MRN: [id number]   HPI Patient presents stating over the last few months have developed a lot of tingling in my feet and I am not sure what might be going on.  Also had some back issues but it seems like my feet right over left is the worst part and I am concerned about neuropathy and I do take 100 mg of gabapentin a day.  Patient does not smoke likes to be active   Review of Systems  All other systems reviewed and are negative.       Objective:  Physical Exam Vitals and nursing note reviewed.  Constitutional:      Appearance: She is well-developed.  Pulmonary:     Effort: Pulmonary effort is normal.  Musculoskeletal:        General: Normal range of motion.  Skin:    General: Skin is warm.  Neurological:     Mental Status: She is alert.     Vascular status found to be intact slight diminishment of sharp dull vibratory noted no muscle strength loss normal reflexes noted bilateral patient has good digit perfusion well oriented x3 and reduced range of motion of the first MPJ of both feet with history of rheumatoid arthritis      Assessment:  Possibility that she may have some form of new neuro neuropathic condition versus something systemic related to the back or other condition     Plan:  H&P reviewed all conditions.  She would like to try to pursue better information as to what may be causing her problems and I am going to send her out for biopsies which we can do here but we need to order the capsule today.  I educated her on this and I discussed getting this done and what the treatment plan will be.  She is very excited for this to try to find out more information as to her problem  X-rays indicate that there is moderate elevation of the first metatarsal segment bilateral with compression of the joint noted bilateral

## 2020-01-07 DIAGNOSIS — M546 Pain in thoracic spine: Secondary | ICD-10-CM | POA: Diagnosis not present

## 2020-01-07 DIAGNOSIS — M545 Low back pain: Secondary | ICD-10-CM | POA: Diagnosis not present

## 2020-01-07 DIAGNOSIS — M9905 Segmental and somatic dysfunction of pelvic region: Secondary | ICD-10-CM | POA: Diagnosis not present

## 2020-01-07 DIAGNOSIS — M9903 Segmental and somatic dysfunction of lumbar region: Secondary | ICD-10-CM | POA: Diagnosis not present

## 2020-01-07 DIAGNOSIS — M9902 Segmental and somatic dysfunction of thoracic region: Secondary | ICD-10-CM | POA: Diagnosis not present

## 2020-01-09 DIAGNOSIS — M9902 Segmental and somatic dysfunction of thoracic region: Secondary | ICD-10-CM | POA: Diagnosis not present

## 2020-01-09 DIAGNOSIS — M9905 Segmental and somatic dysfunction of pelvic region: Secondary | ICD-10-CM | POA: Diagnosis not present

## 2020-01-09 DIAGNOSIS — M9903 Segmental and somatic dysfunction of lumbar region: Secondary | ICD-10-CM | POA: Diagnosis not present

## 2020-01-09 DIAGNOSIS — M546 Pain in thoracic spine: Secondary | ICD-10-CM | POA: Diagnosis not present

## 2020-01-09 DIAGNOSIS — M545 Low back pain: Secondary | ICD-10-CM | POA: Diagnosis not present

## 2020-01-21 ENCOUNTER — Ambulatory Visit: Payer: Medicare PPO | Admitting: Podiatry

## 2020-02-08 DIAGNOSIS — H9209 Otalgia, unspecified ear: Secondary | ICD-10-CM | POA: Diagnosis not present

## 2020-02-08 DIAGNOSIS — Z23 Encounter for immunization: Secondary | ICD-10-CM | POA: Diagnosis not present
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
## 2020-02-29 ENCOUNTER — Ambulatory Visit: Payer: Medicare PPO | Admitting: Gastroenterology

## 2020-03-15 ENCOUNTER — Other Ambulatory Visit: Payer: Self-pay | Admitting: Obstetrics & Gynecology

## 2020-03-28 NOTE — Telephone Encounter (Signed)
Made in error please disregard.

## 2020-03-31 DIAGNOSIS — L281 Prurigo nodularis: Secondary | ICD-10-CM | POA: Diagnosis not present

## 2020-03-31 DIAGNOSIS — D485 Neoplasm of uncertain behavior of skin: Secondary | ICD-10-CM | POA: Diagnosis not present

## 2020-03-31 DIAGNOSIS — D1801 Hemangioma of skin and subcutaneous tissue: Secondary | ICD-10-CM | POA: Diagnosis not present
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
## 2020-04-16 ENCOUNTER — Ambulatory Visit: Payer: Medicare PPO | Admitting: Gastroenterology

## 2020-04-16 ENCOUNTER — Other Ambulatory Visit: Payer: Self-pay

## 2020-04-16 ENCOUNTER — Encounter: Payer: Self-pay | Admitting: Gastroenterology

## 2020-04-16 VITALS — BP 112/75 | HR 73 | Temp 97.7°F | Ht 66.0 in | Wt 198.4 lb

## 2020-04-16 DIAGNOSIS — K5900 Constipation, unspecified: Secondary | ICD-10-CM | POA: Diagnosis not present

## 2020-04-16 DIAGNOSIS — Z1211 Encounter for screening for malignant neoplasm of colon: Secondary | ICD-10-CM

## 2020-04-16 DIAGNOSIS — D649 Anemia, unspecified: Secondary | ICD-10-CM | POA: Diagnosis not present

## 2020-04-16 MED ORDER — LUBIPROSTONE 24 MCG PO CAPS: 24.0000 ug | ORAL_CAPSULE | Freq: Two times a day (BID) | ORAL | 3 refills | Status: DC

## 2020-04-16 NOTE — Patient Instructions (Signed)
Please have labs completed at [facility].  Stop Senokot and start Amitiza 24 mcg twice daily with breakfast and dinner.  Be sure to take this medication with food.  Please call with a progress report in 2 weeks.  If Amitiza is working well, we will schedule you for a colonoscopy.  Ermalinda Memos, PA-C [facility]

## 2020-04-17 ENCOUNTER — Encounter: Payer: Self-pay | Admitting: Gastroenterology

## 2020-04-17 ENCOUNTER — Telehealth: Payer: Self-pay | Admitting: Internal Medicine

## 2020-04-17 NOTE — Telephone Encounter (Signed)
Spoke with pt. PA was submitted through [email or web address]. waiting on an approval or denial.

## 2020-04-17 NOTE — Assessment & Plan Note (Addendum)
Chronic history of constipation.  Trial of Linzess 290 mcg allow for patient to have soft, formed BMs daily, but unfortunately, it also caused "rumbling" in her stomach and uncomfortable sensation which she was not able to tolerate.  She since discontinued Linzess and is taking 5 Senokot daily with 1 BM most days.  No BRBPR or melena.  Denies abdominal pain.  On exam, she has mild tenderness to deep palpation in the RLQ which may be secondary to underlying constipation.  Denies pain other than with deep palpation.  She is overdue for colonoscopy.  Last colonoscopy in 2016 with poor prep, examined portion of the colon appeared normal.   Plan: Stop Senokot. Trial Amitiza 24 mcg twice daily with a meal.  Prescription sent to pharmacy.  Requested progress report in 2 weeks. If Amitiza works well, we will proceed with scheduling colonoscopy.   Would like for constipation to be well controlled prior to scheduling procedure due to history of poor prep.  We will also likely plan for extended bowel prep. Advised to monitor for any worsening abdominal pain and let me know if this occurs. Further recommendations to follow progress report from patient.

## 2020-04-17 NOTE — Telephone Encounter (Signed)
Pt had OV with [place] yesterday. She called today to let us know that [facility] told her that they needed Ermalinda Memos, PA's authorization on her prescription. Please advise. [phone]

## 2020-04-17 NOTE — Assessment & Plan Note (Signed)
Overdue for colon cancer screening.  Last colonoscopy in March 2016 at [facility] with Dr.Mann.  The examined portion of the colon appeared normal, but the prep was poor.  Recommended repeat colonoscopy in 5 years.  She has history of chronic constipation and is currently taking 5 Senokot daily in order to have 1 BM most days.  Prior trial of Linzess 290 mcg daily worked well; however, it caused side effects of stomach "rumbling" and abdominal discomfort which she was not able to tolerate.  No BRBPR, melena, or unintentional weight loss.  No family history of colon cancer.  Plan: We will try to get her on a better bowel regimen prior to scheduling colonoscopy in light of poor prep previously. Stop Senokot and trial Amitiza 24 mcg twice daily with a meal.  Prescription sent.  Requested progress report in 2 weeks. If Amitiza works well, we will move forward with scheduling colonoscopy.  We will likely plan for extended bowel prep as well. Further recommendations to follow progress report.

## 2020-04-17 NOTE — Assessment & Plan Note (Addendum)
Chronic history of mild normocytic anemia with hemoglobin in the upper 10/11 range dating back at least to 2015. Also with history significant for rheumatoid arthritis on methotrexate and Plaquenil.  She has no overt GI bleeding or other obvious blood loss.  Most recent CBC on file in February 2020 with hemoglobin 10.8.  On 12/11/2019, iron panel with iron elevated at 175, ferritin low normal at 18, B12 and folate within normal limits. She does not take any sort of iron supplementation. Overdue for colonoscopy.  Last colonoscopy in March 2016 with Dr. Loreta Ave; the examined portion of the colon was normal, but prep was poor.  Recommended repeat colonoscopy in 5 years. No prior EGD.   Suspect anemia is secondary to chronic disease/med effect.  Doubt GI source contributing.  With last CBC over 1 year ago and ferritin low normal, will go ahead and update CBC and iron panel 1 more time. If evidence of IDA, she would need EGD and colonoscopy.  Otherwise, we will hope to proceed with colonoscopy in the near future pending better management of constipation as discussed below.

## 2020-04-17 NOTE — Progress Notes (Signed)
Cc'ed to pcp °

## 2020-04-22 ENCOUNTER — Telehealth: Payer: Self-pay | Admitting: Internal Medicine

## 2020-04-22 NOTE — Telephone Encounter (Signed)
PATIENT CALLED AND SAID [facility] WAS WAITNIG ON A SIGNATURE SO THEY COULD FILL HER PRESCRIPTION

## 2020-04-23 ENCOUNTER — Telehealth: Payer: Self-pay | Admitting: Internal Medicine

## 2020-04-23 NOTE — Telephone Encounter (Signed)
Pt said her insurance declined her prescription on Amitiza and she should try Lactulose instead. Please advise and call her at [phone]

## 2020-04-23 NOTE — Telephone Encounter (Signed)
Prefer to submit an appeal.

## 2020-04-23 NOTE — Telephone Encounter (Signed)
Spoke with pt. Message was sent to provider. We will contact her when the provider responds.

## 2020-04-23 NOTE — Telephone Encounter (Signed)
Received PA denial for Amitiza 24 mcg. They are asking that pt try Lactulose first. Pt has tried and failed Linzess, Senokot, stool softeners. Would you like pt to try Lactulose or would you like to submit an appeal?

## 2020-04-25 DIAGNOSIS — G9009 Other idiopathic peripheral autonomic neuropathy: Secondary | ICD-10-CM | POA: Diagnosis not present

## 2020-04-29 NOTE — Telephone Encounter (Signed)
Called pts insurance company to work on appeal. Call was disconnected twice. Will call rep back.

## 2020-04-29 NOTE — Telephone Encounter (Signed)
Appeal was submitted through pts insurance. Ref #[id number]. Waiting on an approval or denial.

## 2020-05-11 NOTE — Telephone Encounter (Signed)
Any updates on appeal?

## 2020-05-12 ENCOUNTER — Other Ambulatory Visit: Payer: Self-pay | Admitting: Gastroenterology

## 2020-05-12 DIAGNOSIS — K5900 Constipation, unspecified: Secondary | ICD-10-CM

## 2020-05-12 MED ORDER — LACTULOSE 10 GM/15ML PO SOLN
10.0000 g | Freq: Every day | ORAL | 3 refills | Status: DC
Start: 2020-05-12 — End: 2020-11-13

## 2020-05-12 NOTE — Telephone Encounter (Signed)
Spoke with pts insurance company. This appeal was denied. They would like pt to try Lactulose oral solution. There is another clinical review that can be done by calling [employer] at [phone]. Office notes were given with the last appeal and what pt has tried and failed. Is there any other information that can be given as to why pt doesn't need Lactulose oral solution? Please advise.

## 2020-05-12 NOTE — Telephone Encounter (Signed)
Spoke with pt. Pt is aware that the appeal was denied and pts insurance company are recommending that pt try Lactulose Solution. Pt was advised to try 15 ml daily and call back with a progress report in 2 weeks. Pt was asked to complete labs as recommended at her last ov.

## 2020-05-12 NOTE — Telephone Encounter (Signed)
Please let patient know due to insurance, we will need to try Lactulose for constipation rather then Amitiza. We will start with a low dose and increase if needed. Would like to start with 15 mL daily. I am sending a Rx to her pharmacy. Would like for her to call back in 2 weeks with a progress report.   If this works well, we will proceed with scheduling procedures in the near future.   Please also remind patient to have labs completed that were ordered at [facility].

## 2020-05-20 ENCOUNTER — Telehealth: Payer: Self-pay | Admitting: Internal Medicine

## 2020-05-20 NOTE — Telephone Encounter (Signed)
Patient called and said that the [facility] prescription worked well

## 2020-05-20 NOTE — Telephone Encounter (Signed)
That is wonderful! We will continue with her current dose. We need to get procedures arranged; however, I need her to have labs completed that were ordered at her office visit to ensure she doesn't have evidence of iron deficiency anemia. Please remind her of this.

## 2020-05-20 NOTE — Telephone Encounter (Signed)
Spoke with pt. Pt is taking Lactulose Solution as prescribed. The medication is working well for pt and her bowels are moving daily with no problems

## 2020-05-21 DIAGNOSIS — D649 Anemia, unspecified: Secondary | ICD-10-CM | POA: Diagnosis not present

## 2020-05-21 NOTE — Telephone Encounter (Signed)
Noted. Spoke with pt. Pt is aware that she will stay at her current does of Lactulose. Pt will complete labs at [place] today per pt.

## 2020-05-22 ENCOUNTER — Other Ambulatory Visit: Payer: Self-pay

## 2020-05-22 LAB — CBC WITH DIFFERENTIAL/PLATELET
Absolute Monocytes: 645 cells/uL (ref 200–950)
Basophils Absolute: 62 cells/uL (ref 0–200)
Basophils Relative: 1 %
Eosinophils Absolute: 236 cells/uL (ref 15–500)
Eosinophils Relative: 3.8 %
HCT: 38.3 % (ref 35.0–45.0)
Hemoglobin: 12.9 g/dL (ref 11.7–15.5)
Lymphs Abs: 2288 cells/uL (ref 850–3900)
MCH: 29.6 pg (ref 27.0–33.0)
MCHC: 33.7 g/dL (ref 32.0–36.0)
MCV: 87.8 fL (ref 80.0–100.0)
MPV: 10 fL (ref 7.5–12.5)
Monocytes Relative: 10.4 %
Neutro Abs: 2970 cells/uL (ref 1500–7800)
Neutrophils Relative %: 47.9 %
Platelets: 310 10*3/uL (ref 140–400)
RBC: 4.36 10*6/uL (ref 3.80–5.10)
RDW: 13.9 % (ref 11.0–15.0)
Total Lymphocyte: 36.9 %
WBC: 6.2 10*3/uL (ref 3.8–10.8)

## 2020-05-22 LAB — IRON,TIBC AND FERRITIN PANEL
%SAT: 27 % (calc) (ref 16–45)
Ferritin: 28 ng/mL (ref 16–288)
Iron: 98 ug/dL (ref 45–160)
TIBC: 361 mcg/dL (calc) (ref 250–450)

## 2020-05-22 MED ORDER — PEG 3350-KCL-NA BICARB-NACL 420 G PO SOLR: 4000.0000 mL | ORAL | 0 refills | Status: DC

## 2020-05-23 ENCOUNTER — Other Ambulatory Visit: Payer: Self-pay

## 2020-05-26 NOTE — Telephone Encounter (Signed)
Noted. See lab results for additional recommendations.

## 2020-06-17 ENCOUNTER — Telehealth: Payer: Self-pay | Admitting: Internal Medicine

## 2020-06-17 ENCOUNTER — Other Ambulatory Visit: Payer: Self-pay

## 2020-06-17 ENCOUNTER — Other Ambulatory Visit (HOSPITAL_COMMUNITY)
Admission: RE | Admit: 2020-06-17 | Discharge: 2020-06-17 | Disposition: A | Discharge status: Home / Self Care | Payer: Medicare PPO | Source: Ambulatory Visit | Attending: Internal Medicine | Admitting: Internal Medicine

## 2020-06-17 DIAGNOSIS — Z20822 Contact with and (suspected) exposure to covid-19: Secondary | ICD-10-CM | POA: Diagnosis not present

## 2020-06-17 DIAGNOSIS — Z01812 Encounter for preprocedural laboratory examination: Secondary | ICD-10-CM | POA: Diagnosis not present

## 2020-06-17 LAB — SARS CORONAVIRUS 2 (TAT 6-24 HRS): SARS Coronavirus 2: NEGATIVE

## 2020-06-17 MED ORDER — PEG 3350-KCL-NA BICARB-NACL 420 G PO SOLR: 4000.0000 mL | ORAL | 0 refills | Status: DC

## 2020-06-17 NOTE — Telephone Encounter (Signed)
Pt said she was confused and has questions about her prep. She is scheduled with Dr Gala Romney on 06/19/2020. Please call [phone]

## 2020-06-17 NOTE — Telephone Encounter (Signed)
Called pt, she received 1 container of Tri-Lyte from pharmacy and needs 1 1/2 preps. 2 prescriptions were sent to pharmacy. Advised her I would send another rx for prep. Rx sent.

## 2020-06-19 ENCOUNTER — Ambulatory Visit (HOSPITAL_COMMUNITY): Payer: Medicare PPO | Admitting: Anesthesiology

## 2020-06-19 ENCOUNTER — Encounter (HOSPITAL_COMMUNITY)
Admission: RE | Disposition: A | Discharge status: Home / Self Care | Payer: Self-pay | Source: Home / Self Care | Attending: Internal Medicine

## 2020-06-19 ENCOUNTER — Encounter (HOSPITAL_COMMUNITY): Payer: Self-pay | Admitting: Internal Medicine

## 2020-06-19 ENCOUNTER — Ambulatory Visit (HOSPITAL_COMMUNITY)
Admission: RE | Admit: 2020-06-19 | Discharge: 2020-06-19 | Disposition: A | Discharge status: Home / Self Care | Payer: Medicare PPO | Attending: Internal Medicine | Admitting: Internal Medicine

## 2020-06-19 ENCOUNTER — Other Ambulatory Visit: Payer: Self-pay

## 2020-06-19 DIAGNOSIS — Z79899 Other long term (current) drug therapy: Secondary | ICD-10-CM | POA: Insufficient documentation

## 2020-06-19 DIAGNOSIS — K6389 Other specified diseases of intestine: Secondary | ICD-10-CM | POA: Diagnosis not present

## 2020-06-19 DIAGNOSIS — Z1211 Encounter for screening for malignant neoplasm of colon: Secondary | ICD-10-CM | POA: Diagnosis not present

## 2020-06-19 DIAGNOSIS — Z87891 Personal history of nicotine dependence: Secondary | ICD-10-CM | POA: Insufficient documentation

## 2020-06-19 DIAGNOSIS — Q438 Other specified congenital malformations of intestine: Secondary | ICD-10-CM | POA: Diagnosis not present

## 2020-06-19 HISTORY — PX: COLONOSCOPY WITH PROPOFOL: SHX5780

## 2020-06-19 SURGERY — COLONOSCOPY WITH PROPOFOL
Anesthesia: General

## 2020-06-19 MED ORDER — PROPOFOL 10 MG/ML IV BOLUS
INTRAVENOUS | Status: AC
  Filled 2020-06-19: qty 60

## 2020-06-19 MED ORDER — GLYCOPYRROLATE 0.2 MG/ML IJ SOLN
INTRAMUSCULAR | Status: DC | PRN
  Administered 2020-06-19: .1 mg via INTRAVENOUS

## 2020-06-19 MED ORDER — LIDOCAINE HCL (CARDIAC) PF 100 MG/5ML IV SOSY
PREFILLED_SYRINGE | INTRAVENOUS | Status: DC | PRN
  Administered 2020-06-19: 50 mg via INTRATRACHEAL

## 2020-06-19 MED ORDER — LACTATED RINGERS IV SOLN: INTRAVENOUS | Status: DC

## 2020-06-19 MED ORDER — KETAMINE HCL 50 MG/5ML IJ SOSY
PREFILLED_SYRINGE | INTRAMUSCULAR | Status: AC
  Filled 2020-06-19: qty 5

## 2020-06-19 MED ORDER — KETAMINE HCL 10 MG/ML IJ SOLN
INTRAMUSCULAR | Status: DC | PRN
  Administered 2020-06-19: 10 mg via INTRAVENOUS

## 2020-06-19 MED ORDER — PROPOFOL 500 MG/50ML IV EMUL
INTRAVENOUS | Status: DC | PRN
  Administered 2020-06-19: 150 ug/kg/min via INTRAVENOUS

## 2020-06-19 MED ORDER — PROPOFOL 10 MG/ML IV BOLUS
INTRAVENOUS | Status: DC | PRN
  Administered 2020-06-19: 30 mg via INTRAVENOUS

## 2020-06-19 MED ORDER — STERILE WATER FOR IRRIGATION IR SOLN
Status: DC | PRN
  Administered 2020-06-19: 100 mL

## 2020-06-19 NOTE — Anesthesia Postprocedure Evaluation (Signed)
Anesthesia Post Note  Patient: Whitney Moreno  Procedure(s) Performed: COLONOSCOPY WITH PROPOFOL (N/A )  Patient location during evaluation: Endoscopy Anesthesia Type: General Level of consciousness: awake and alert and patient cooperative Pain management: satisfactory to patient Vital Signs Assessment: post-procedure vital signs reviewed and stable Respiratory status: spontaneous breathing Cardiovascular status: stable Postop Assessment: no apparent nausea or vomiting Anesthetic complications: no   No complications documented.   Last Vitals:  Vitals:   06/19/20 0941 06/19/20 1106  BP: 118/69 111/61  Pulse: 73 63  Resp: 18 13  Temp: 36.4 C 36.4 C  SpO2: 97% 95%    Last Pain:  Vitals:   06/19/20 1106  TempSrc: Oral  PainSc: 0-No pain                 Raymond Bhardwaj

## 2020-06-19 NOTE — Discharge Instructions (Signed)
  Colonoscopy Discharge Instructions  Read the instructions outlined below and refer to this sheet in the next few weeks. These discharge instructions provide you with general information on caring for yourself after you leave the hospital. Your doctor may also give you specific instructions. While your treatment has been planned according to the most current medical practices available, unavoidable complications occasionally occur. If you have any problems or questions after discharge, call Dr. Gala Romney at [phone]. ACTIVITY  You may resume your regular activity, but move at a slower pace for the next 24 hours.   Take frequent rest periods for the next 24 hours.   Walking will help get rid of the air and reduce the bloated feeling in your belly (abdomen).   No driving for 24 hours (because of the medicine (anesthesia) used during the test).    Do not sign any important legal documents or operate any machinery for 24 hours (because of the anesthesia used during the test).  NUTRITION  Drink plenty of fluids.   You may resume your normal diet as instructed by your doctor.   Begin with a light meal and progress to your normal diet. Heavy or fried foods are harder to digest and may make you feel sick to your stomach (nauseated).   Avoid alcoholic beverages for 24 hours or as instructed.  MEDICATIONS  You may resume your normal medications unless your doctor tells you otherwise.  WHAT YOU CAN EXPECT TODAY  Some feelings of bloating in the abdomen.   Passage of more gas than usual.   Spotting of blood in your stool or on the toilet paper.  IF YOU HAD POLYPS REMOVED DURING THE COLONOSCOPY:  No aspirin products for 7 days or as instructed.   No alcohol for 7 days or as instructed.   Eat a soft diet for the next 24 hours.  FINDING OUT THE RESULTS OF YOUR TEST Not all test results are available during your visit. If your test results are not back during the visit, make an appointment  with your caregiver to find out the results. Do not assume everything is normal if you have not heard from your caregiver or the medical facility. It is important for you to follow up on all of your test results.  SEEK IMMEDIATE MEDICAL ATTENTION IF:  You have more than a spotting of blood in your stool.   Your belly is swollen (abdominal distention).   You are nauseated or vomiting.   You have a temperature over 101.   You have abdominal pain or discomfort that is severe or gets worse throughout the day.    No polyps found today  A future colonoscopy is not recommended  Office visit with [place] in 6 months if not already scheduled  At patient request, I called Kezia Benevides at [phone] -got voicemail-left message

## 2020-06-19 NOTE — Anesthesia Preprocedure Evaluation (Addendum)
Anesthesia Evaluation  Patient identified by MRN, date of birth, ID band Patient awake    Reviewed: Allergy & Precautions, NPO status , Patient's Chart, lab work & pertinent test results  History of Anesthesia Complications Negative for: history of anesthetic complications  Airway Mallampati: II  TM Distance: >3 FB Neck ROM: Full    Dental  (+) Dental Advisory Given, Teeth Intact, Missing   Pulmonary neg pulmonary ROS, former smoker,    Pulmonary exam normal breath sounds clear to auscultation       Cardiovascular Exercise Tolerance: Good negative cardio ROS Normal cardiovascular exam Rhythm:Regular Rate:Normal     Neuro/Psych Brain tumor  Neuromuscular disease negative psych ROS   GI/Hepatic negative GI ROS, Neg liver ROS, Splenectomy     Endo/Other  negative endocrine ROS  Renal/GU negative Renal ROS  negative genitourinary   Musculoskeletal  (+) Arthritis , Rheumatoid disorders,  Fibromyalgia -  Abdominal   Peds  Hematology  (+) anemia ,   Anesthesia Other Findings   Reproductive/Obstetrics                            Anesthesia Physical Anesthesia Plan  ASA: II  Anesthesia Plan: General   Post-op Pain Management:    Induction: Intravenous  PONV Risk Score and Plan: TIVA  Airway Management Planned: Nasal Cannula and Natural Airway  Additional Equipment:   Intra-op Plan:   Post-operative Plan:   Informed Consent: I have reviewed the patients History and Physical, chart, labs and discussed the procedure including the risks, benefits and alternatives for the proposed anesthesia with the patient or authorized representative who has indicated his/her understanding and acceptance.     Dental advisory given  Plan Discussed with: CRNA and Surgeon  Anesthesia Plan Comments:         Anesthesia Quick Evaluation

## 2020-06-19 NOTE — Op Note (Signed)
[facility] Patient Name: Whitney Moreno Procedure Date: 06/19/2020 10:21 AM MRN: [id number] Date of Birth: 1950/04/28 Attending MD: Norvel Richards , MD CSN: [id number] Age: 70 Admit Type: Outpatient Procedure:                Colonoscopy Indications:              Screening for colorectal malignant neoplasm Providers:                Norvel Richards, MD, Lambert Mody,                            Raphael Gibney, Technician Referring MD:              Medicines:                Propofol per Anesthesia Complications:            No immediate complications. Estimated Blood Loss:     Estimated blood loss: none. Procedure:                Pre-Anesthesia Assessment:                           - Prior to the procedure, a History and Physical                            was performed, and patient medications and                            allergies were reviewed. The patient's tolerance of                            previous anesthesia was also reviewed. The risks                            and benefits of the procedure and the sedation                            options and risks were discussed with the patient.                            All questions were answered, and informed consent                            was obtained. Prior Anticoagulants: The patient has                            taken no previous anticoagulant or antiplatelet                            agents. ASA Grade Assessment: III - A patient with                            severe systemic disease. After reviewing the risks  and benefits, the patient was deemed in                            satisfactory condition to undergo the procedure.                           After obtaining informed consent, the colonoscope                            was passed under direct vision. Throughout the                            procedure, the patient's blood pressure, pulse, and                             oxygen saturations were monitored continuously. The                            [id number]-[id number] ([id number]) scope was introduced through                            the anus and advanced to the the cecum, identified                            by appendiceal orifice and ileocecal valve. The                            colonoscopy was performed without difficulty. The                            patient tolerated the procedure well. The quality                            of the bowel preparation was adequate. Scope In: 10:44:39 AM Scope Out: 11:01:01 AM Scope Withdrawal Time: 0 hours 6 minutes 36 seconds  Total Procedure Duration: 0 hours 16 minutes 22 seconds  Findings:      The perianal and digital rectal examinations were normal. Long,       redundant colon. Fair amount of viscous effluent found in the mid colon       which was lavaged and suctioned out to gain an adequate preparation.       External abdominal pressure required to reach the cecum.      A diffuse area of severe melanosis was found in the ascending colon.      The exam was otherwise without abnormality on direct and retroflexion       views. Impression:               - Melanosis in the colon. Redundant colon.                           - The examination was otherwise normal on direct                            and retroflexion views.                           -  No specimens collected. Moderate Sedation:      Moderate (conscious) sedation was personally administered by an       anesthesia professional. The following parameters were monitored: oxygen       saturation, heart rate, blood pressure, respiratory rate, EKG, adequacy       of pulmonary ventilation, and response to care. Recommendation:           - Patient has a contact number available for                            emergencies. The signs and symptoms of potential                            delayed complications were discussed with the                            patient.  Return to normal activities tomorrow.                            Written discharge instructions were provided to the                            patient.                           - Resume previous diet.                           - Continue present medications.                           - No repeat colonoscopy.                           - Return to GI clinic in 6 months. Procedure Code(s):        --- Professional ---                           [phone], Colonoscopy, flexible; diagnostic, including                            collection of specimen(s) by brushing or washing,                            when performed (separate procedure) Diagnosis Code(s):        --- Professional ---                           Z12.11, Encounter for screening for malignant                            neoplasm of colon                           K63.89, Other specified diseases of intestine CPT copyright 2019 American Medical Association. All rights reserved. The codes documented in this report are preliminary and upon coder  review may  be revised to meet current compliance requirements. Cristopher Estimable. Lara Palinkas, MD Norvel Richards, MD 06/19/2020 11:10:15 AM This report has been signed electronically. Number of Addenda: 0

## 2020-06-19 NOTE — Transfer of Care (Signed)
Immediate Anesthesia Transfer of Care Note  Patient: PRESCIOUS HURLESS  Procedure(s) Performed: COLONOSCOPY WITH PROPOFOL (N/A )  Patient Location: Endoscopy Unit  Anesthesia Type:General  Level of Consciousness: awake and patient cooperative  Airway & Oxygen Therapy: Patient Spontanous Breathing  Post-op Assessment: Report given to RN and Post -op Vital signs reviewed and stable  Post vital signs: Reviewed and stable  Last Vitals:  Vitals Value Taken Time  BP    Temp    Pulse    Resp    SpO2     SEE VITAL SIGN FLOW SHEET Last Pain:  Vitals:   06/19/20 1040  TempSrc:   PainSc: 0-No pain      Patients Stated Pain Goal: 7 (54/88/30 1415)  Complications: No complications documented.
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
## 2020-06-25 ENCOUNTER — Encounter (HOSPITAL_COMMUNITY): Payer: Self-pay | Admitting: Internal Medicine

## 2020-07-01 ENCOUNTER — Encounter: Payer: Self-pay | Admitting: *Deleted

## 2020-07-01 ENCOUNTER — Ambulatory Visit: Payer: Medicare PPO | Admitting: Neurology

## 2020-07-03 ENCOUNTER — Encounter: Payer: Self-pay | Admitting: Neurology

## 2020-07-03 ENCOUNTER — Ambulatory Visit: Payer: Medicare PPO | Admitting: Neurology

## 2020-07-03 VITALS — BP 107/68 | HR 87 | Ht 66.0 in | Wt 202.0 lb

## 2020-07-03 DIAGNOSIS — M792 Neuralgia and neuritis, unspecified: Secondary | ICD-10-CM | POA: Insufficient documentation

## 2020-07-03 DIAGNOSIS — G629 Polyneuropathy, unspecified: Secondary | ICD-10-CM | POA: Diagnosis not present

## 2020-07-03 MED ORDER — LIDOCAINE-PRILOCAINE 2.5-2.5 % EX CREA: TOPICAL_CREAM | CUTANEOUS | 11 refills | Status: DC

## 2020-07-03 MED ORDER — GABAPENTIN 300 MG PO CAPS: 600.0000 mg | ORAL_CAPSULE | Freq: Every day | ORAL | 4 refills | Status: DC

## 2020-07-03 MED ORDER — DICLOFENAC SODIUM 1 % EX CREA: TOPICAL_CREAM | CUTANEOUS | 11 refills | Status: AC
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
## 2020-07-07 LAB — COMPREHENSIVE METABOLIC PANEL
ALT: 25 IU/L (ref 0–32)
AST: 27 IU/L (ref 0–40)
Albumin/Globulin Ratio: 1.7 (ref 1.2–2.2)
Albumin: 4.6 g/dL (ref 3.8–4.8)
Alkaline Phosphatase: 92 IU/L (ref 44–121)
BUN/Creatinine Ratio: 17 (ref 12–28)
BUN: 15 mg/dL (ref 8–27)
Bilirubin Total: 0.3 mg/dL (ref 0.0–1.2)
CO2: 23 mmol/L (ref 20–29)
Calcium: 10.1 mg/dL (ref 8.7–10.3)
Chloride: 105 mmol/L (ref 96–106)
Creatinine, Ser: 0.89 mg/dL (ref 0.57–1.00)
Globulin, Total: 2.7 g/dL (ref 1.5–4.5)
Glucose: 76 mg/dL (ref 65–99)
Potassium: 4.6 mmol/L (ref 3.5–5.2)
Sodium: 144 mmol/L (ref 134–144)
Total Protein: 7.3 g/dL (ref 6.0–8.5)
eGFR: 71 mL/min/{1.73_m2} (ref >59)

## 2020-07-07 LAB — MULTIPLE MYELOMA PANEL, SERUM
Albumin SerPl Elph-Mcnc: 3.8 g/dL (ref 2.9–4.4)
Albumin/Glob SerPl: 1.1 (ref 0.7–1.7)
Alpha 1: 0.2 g/dL (ref 0.0–0.4)
Alpha2 Glob SerPl Elph-Mcnc: 0.7 g/dL (ref 0.4–1.0)
B-Globulin SerPl Elph-Mcnc: 1.4 g/dL — ABNORMAL HIGH (ref 0.7–1.3)
Gamma Glob SerPl Elph-Mcnc: 1.1 g/dL (ref 0.4–1.8)
Globulin, Total: 3.5 g/dL (ref 2.2–3.9)
IgA/Immunoglobulin A, Serum: 342 mg/dL (ref 87–352)
IgG (Immunoglobin G), Serum: 1142 mg/dL (ref 586–1602)
IgM (Immunoglobulin M), Srm: 69 mg/dL (ref 26–217)

## 2020-07-07 LAB — CK: Total CK: 154 U/L (ref 32–182)

## 2020-07-07 LAB — CBC WITH DIFFERENTIAL/PLATELET
Basophils Absolute: 0.1 10*3/uL (ref 0.0–0.2)
Basos: 1 %
EOS (ABSOLUTE): 0.3 10*3/uL (ref 0.0–0.4)
Eos: 5 %
Hematocrit: 41.6 % (ref 34.0–46.6)
Hemoglobin: 13.5 g/dL (ref 11.1–15.9)
Immature Grans (Abs): 0 10*3/uL (ref 0.0–0.1)
Immature Granulocytes: 0 %
Lymphocytes Absolute: 2 10*3/uL (ref 0.7–3.1)
Lymphs: 31 %
MCH: 29.3 pg (ref 26.6–33.0)
MCHC: 32.5 g/dL (ref 31.5–35.7)
MCV: 90 fL (ref 79–97)
Monocytes Absolute: 0.6 10*3/uL (ref 0.1–0.9)
Monocytes: 10 %
Neutrophils Absolute: 3.4 10*3/uL (ref 1.4–7.0)
Neutrophils: 53 %
Platelets: 349 10*3/uL (ref 150–450)
RBC: 4.61 x10E6/uL (ref 3.77–5.28)
RDW: 15 % (ref 11.7–15.4)
WBC: 6.4 10*3/uL (ref 3.4–10.8)

## 2020-07-07 LAB — ANA W/REFLEX IF POSITIVE: Anti Nuclear Antibody (ANA): NEGATIVE

## 2020-07-07 LAB — TSH: TSH: 2.26 u[IU]/mL (ref 0.450–4.500)

## 2020-07-07 LAB — HGB A1C W/O EAG: Hgb A1c MFr Bld: 5.9 % — ABNORMAL HIGH (ref 4.8–5.6)

## 2020-07-07 LAB — FOLATE: Folate: >20 ng/mL (ref >3.0)

## 2020-07-07 LAB — RPR: RPR Ser Ql: NONREACTIVE

## 2020-07-07 LAB — VITAMIN B12: Vitamin B-12: 1062 pg/mL (ref 232–1245)

## 2020-07-07 LAB — SEDIMENTATION RATE: Sed Rate: 8 mm/hr (ref 0–40)

## 2020-07-07 LAB — VITAMIN D 25 HYDROXY (VIT D DEFICIENCY, FRACTURES): Vit D, 25-Hydroxy: 135 ng/mL — ABNORMAL HIGH (ref 30.0–100.0)

## 2020-07-07 LAB — FERRITIN: Ferritin: 61 ng/mL (ref 15–150)

## 2020-07-07 LAB — C-REACTIVE PROTEIN: CRP: 3 mg/L (ref 0–10)

## 2020-07-14 ENCOUNTER — Ambulatory Visit (INDEPENDENT_AMBULATORY_CARE_PROVIDER_SITE_OTHER): Payer: Medicare PPO | Admitting: Neurology

## 2020-07-14 ENCOUNTER — Encounter: Payer: Medicare PPO | Admitting: Neurology

## 2020-07-14 DIAGNOSIS — Z0289 Encounter for other administrative examinations: Secondary | ICD-10-CM

## 2020-07-14 DIAGNOSIS — G629 Polyneuropathy, unspecified: Secondary | ICD-10-CM

## 2020-07-14 DIAGNOSIS — M792 Neuralgia and neuritis, unspecified: Secondary | ICD-10-CM

## 2020-07-14 NOTE — Procedures (Signed)
Full Name: Whitney Moreno Gender: Female MRN #: [id number] Date of Birth: 1950/12/01    Visit Date: 07/14/2020 07:57 Age: 70 Years Examining Physician: Marcial Pacas, MD  Referring Physician: Marcial Pacas, MD History: 70 year old female, presented with bilateral feet paresthesia, intermittent bilateral hands paresthesia  Summary of the test: Nerve conduction study: Right superficial peroneal, sural, ulnar, left median sensory responses were normal.  Right median sensory response showed mildly prolonged peak latency with normal snap amplitude.  Right tibial, peroneal to EDB, ulnar, bilateral median motor responses were normal.  Electromyography: Selected needle examinations were performed at right upper, lower extremity muscles; right cervical and right lumbosacral paraspinal muscles.  There was no significant abnormality found  Conclusion: This is a mild abnormal study.  There is electrodiagnostic evidence of mild right carpal tunnel syndrome.  There is no evidence of large fiber peripheral neuropathy, there is no evidence of right cervical or lumbosacral radiculopathy.    ------------------------------- Marcial Pacas, M.D. PhD  [facility] [address], [place], [place] [address] Tel: [phone] Fax: [phone]  Verbal informed consent was obtained from the patient, patient was informed of potential risk of procedure, including bruising, bleeding, hematoma formation, infection, muscle weakness, muscle pain, numbness, among others.        [place]    Nerve / Sites Muscle Latency Ref. Amplitude Ref. Rel Amp Segments Distance Velocity Ref. Area    ms ms mV mV %  cm m/s m/s mVms  R Median - APB     Wrist APB 4.0 ?4.4 9.5 ?4.0 100 Wrist - APB 7   36.9     Upper arm APB 8.2  8.7  92.3 Upper arm - Wrist 23 55 ?49 34.7  L Median - APB     Wrist APB 3.5 ?4.4 7.0 ?4.0 100 Wrist - APB 7   24.7     Upper arm APB 7.6  5.7  81.8 Upper arm - Wrist 25 62 ?49 25.5   R Ulnar - ADM     Wrist ADM 2.5 ?3.3 8.5 ?6.0 100 Wrist - ADM 7   28.8     B.Elbow ADM 5.7  9.4  110 B.Elbow - Wrist 20 62 ?49 32.1     A.Elbow ADM 7.4  8.3  88.2 A.Elbow - B.Elbow 10 61 ?49 30.5  R Peroneal - EDB     Ankle EDB 4.3 ?6.5 7.0 ?2.0 100 Ankle - EDB 9   25.1     Fib head EDB 9.1  6.1  87.6 Fib head - Ankle 26 54 ?44 23.5     Pop fossa EDB 11.3  6.3  103 Pop fossa - Fib head 10 48 ?44 24.2         Pop fossa - Ankle      R Tibial - AH     Ankle AH 3.8 ?5.8 9.1 ?4.0 100 Ankle - AH 9   18.1     Pop fossa AH 11.8  7.4  80.9 Pop fossa - Ankle 35 44 ?41 20.7                SNC    Nerve / Sites Rec. Site Peak Lat Ref.  Amp Ref. Segments Distance    ms ms V V  cm  R Sural - Ankle (Calf)     Calf Ankle 3.3 ?4.4 23 ?6 Calf - Ankle 14  R Superficial peroneal - Ankle     Lat leg Ankle  3.7 ?4.4 7 ?6 Lat leg - Ankle 14  R Median - Orthodromic (Dig II, Mid palm)     Dig II Wrist 3.6 ?3.4 10 ?10 Dig II - Wrist 13  L Median - Orthodromic (Dig II, Mid palm)     Dig II Wrist 3.3 ?3.4 16 ?10 Dig II - Wrist 13  R Ulnar - Orthodromic, (Dig V, Mid palm)     Dig V Wrist 2.6 ?3.1 7 ?5 Dig V - Wrist 11               F  Wave    Nerve F Lat Ref.   ms ms  R Ulnar - ADM 28.9 ?32.0  R Tibial - AH 55.6 ?56.0         EMG Summary Table    Spontaneous MUAP Recruitment  Muscle IA Fib PSW Fasc Other Amp Dur. Poly Pattern  R. Tibialis anterior Normal None None None _______ Normal Normal Normal Normal  R. Tibialis posterior Normal None None None _______ Normal Normal Normal Normal  R. Peroneus longus Normal None None None _______ Normal Normal Normal Normal  R. Gastrocnemius (Medial head) Normal None None None _______ Normal Normal Normal Normal  R. Vastus lateralis Normal None None None _______ Normal Normal Normal Normal  R. Lumbar paraspinals (low) Normal None None None _______ Normal Normal Normal Normal  R. Lumbar paraspinals (mid) Normal None None None _______ Normal Normal Normal Normal   R. First dorsal interosseous Normal None None None _______ Normal Normal Normal Normal  R. Pronator teres Normal None None None _______ Normal Normal Normal Normal  R. Deltoid Normal None None None _______ Normal Normal Normal Normal  R. Triceps brachii Normal None None None _______ Normal Normal Normal Normal  R. Abductor pollicis brevis Normal None None None _______ Normal Normal Normal Normal  R. Cervical paraspinals Normal None None None _______ Normal Normal Normal Normal

## 2020-09-30 DIAGNOSIS — Z1231 Encounter for screening mammogram for malignant neoplasm of breast: Secondary | ICD-10-CM | POA: Diagnosis not present

## 2020-10-06 DIAGNOSIS — M5136 Other intervertebral disc degeneration, lumbar region: Secondary | ICD-10-CM | POA: Diagnosis not present

## 2020-10-06 DIAGNOSIS — R768 Other specified abnormal immunological findings in serum: Secondary | ICD-10-CM | POA: Diagnosis not present

## 2020-10-06 DIAGNOSIS — M150 Primary generalized (osteo)arthritis: Secondary | ICD-10-CM | POA: Diagnosis not present

## 2020-10-06 DIAGNOSIS — E669 Obesity, unspecified: Secondary | ICD-10-CM | POA: Diagnosis not present

## 2020-10-06 DIAGNOSIS — M0609 Rheumatoid arthritis without rheumatoid factor, multiple sites: Secondary | ICD-10-CM | POA: Diagnosis not present

## 2020-10-06 DIAGNOSIS — M797 Fibromyalgia: Secondary | ICD-10-CM | POA: Diagnosis not present

## 2020-10-06 DIAGNOSIS — Z6832 Body mass index (BMI) 32.0-32.9, adult: Secondary | ICD-10-CM | POA: Diagnosis not present

## 2020-10-09 ENCOUNTER — Encounter (HOSPITAL_BASED_OUTPATIENT_CLINIC_OR_DEPARTMENT_OTHER): Payer: Self-pay | Admitting: Obstetrics & Gynecology

## 2020-10-17 DIAGNOSIS — R7309 Other abnormal glucose: Secondary | ICD-10-CM | POA: Diagnosis not present

## 2020-10-17 DIAGNOSIS — H43812 Vitreous degeneration, left eye: Secondary | ICD-10-CM | POA: Diagnosis not present

## 2020-10-17 DIAGNOSIS — M797 Fibromyalgia: Secondary | ICD-10-CM | POA: Diagnosis not present

## 2020-10-17 DIAGNOSIS — G629 Polyneuropathy, unspecified: Secondary | ICD-10-CM | POA: Diagnosis not present

## 2020-10-17 DIAGNOSIS — Z0000 Encounter for general adult medical examination without abnormal findings: Secondary | ICD-10-CM | POA: Diagnosis not present

## 2020-10-17 DIAGNOSIS — Z6829 Body mass index (BMI) 29.0-29.9, adult: Secondary | ICD-10-CM | POA: Diagnosis not present

## 2020-10-17 DIAGNOSIS — M069 Rheumatoid arthritis, unspecified: Secondary | ICD-10-CM | POA: Diagnosis not present

## 2020-11-06 DIAGNOSIS — D1801 Hemangioma of skin and subcutaneous tissue: Secondary | ICD-10-CM | POA: Diagnosis not present

## 2020-11-06 DIAGNOSIS — L298 Other pruritus: Secondary | ICD-10-CM | POA: Diagnosis not present

## 2020-11-06 DIAGNOSIS — L821 Other seborrheic keratosis: Secondary | ICD-10-CM | POA: Diagnosis not present

## 2020-11-06 DIAGNOSIS — L738 Other specified follicular disorders: Secondary | ICD-10-CM | POA: Diagnosis not present

## 2020-11-10 ENCOUNTER — Ambulatory Visit: Payer: Medicare PPO

## 2020-11-12 ENCOUNTER — Other Ambulatory Visit: Payer: Self-pay | Admitting: Gastroenterology

## 2020-11-12 DIAGNOSIS — K5900 Constipation, unspecified: Secondary | ICD-10-CM

## 2020-11-26 ENCOUNTER — Encounter: Payer: Self-pay | Admitting: Internal Medicine

## 2020-12-19 ENCOUNTER — Ambulatory Visit: Payer: Medicare PPO | Admitting: Gastroenterology

## 2021-01-12 DIAGNOSIS — M9902 Segmental and somatic dysfunction of thoracic region: Secondary | ICD-10-CM | POA: Diagnosis not present

## 2021-01-12 DIAGNOSIS — M546 Pain in thoracic spine: Secondary | ICD-10-CM | POA: Diagnosis not present

## 2021-01-12 DIAGNOSIS — M62830 Muscle spasm of back: Secondary | ICD-10-CM | POA: Diagnosis not present

## 2021-01-12 DIAGNOSIS — M9903 Segmental and somatic dysfunction of lumbar region: Secondary | ICD-10-CM | POA: Diagnosis not present

## 2021-01-12 DIAGNOSIS — M9905 Segmental and somatic dysfunction of pelvic region: Secondary | ICD-10-CM | POA: Diagnosis not present

## 2021-03-18 DIAGNOSIS — M9903 Segmental and somatic dysfunction of lumbar region: Secondary | ICD-10-CM | POA: Diagnosis not present

## 2021-03-18 DIAGNOSIS — M9905 Segmental and somatic dysfunction of pelvic region: Secondary | ICD-10-CM | POA: Diagnosis not present

## 2021-03-18 DIAGNOSIS — M542 Cervicalgia: Secondary | ICD-10-CM | POA: Diagnosis not present

## 2021-03-18 DIAGNOSIS — M25561 Pain in right knee: Secondary | ICD-10-CM | POA: Diagnosis not present

## 2021-03-18 DIAGNOSIS — M62830 Muscle spasm of back: Secondary | ICD-10-CM | POA: Diagnosis not present

## 2021-03-18 DIAGNOSIS — M9901 Segmental and somatic dysfunction of cervical region: Secondary | ICD-10-CM | POA: Diagnosis not present

## 2021-03-18 DIAGNOSIS — M9902 Segmental and somatic dysfunction of thoracic region: Secondary | ICD-10-CM | POA: Diagnosis not present

## 2021-03-18 DIAGNOSIS — M546 Pain in thoracic spine: Secondary | ICD-10-CM | POA: Diagnosis not present

## 2021-03-18 DIAGNOSIS — M9906 Segmental and somatic dysfunction of lower extremity: Secondary | ICD-10-CM | POA: Diagnosis not present

## 2021-03-20 DIAGNOSIS — M542 Cervicalgia: Secondary | ICD-10-CM | POA: Diagnosis not present

## 2021-03-20 DIAGNOSIS — M9901 Segmental and somatic dysfunction of cervical region: Secondary | ICD-10-CM | POA: Diagnosis not present

## 2021-03-20 DIAGNOSIS — M9902 Segmental and somatic dysfunction of thoracic region: Secondary | ICD-10-CM | POA: Diagnosis not present

## 2021-03-20 DIAGNOSIS — M546 Pain in thoracic spine: Secondary | ICD-10-CM | POA: Diagnosis not present

## 2021-03-20 DIAGNOSIS — M62830 Muscle spasm of back: Secondary | ICD-10-CM | POA: Diagnosis not present

## 2021-03-20 DIAGNOSIS — M9905 Segmental and somatic dysfunction of pelvic region: Secondary | ICD-10-CM | POA: Diagnosis not present

## 2021-03-20 DIAGNOSIS — M25561 Pain in right knee: Secondary | ICD-10-CM | POA: Diagnosis not present

## 2021-03-20 DIAGNOSIS — M9903 Segmental and somatic dysfunction of lumbar region: Secondary | ICD-10-CM | POA: Diagnosis not present

## 2021-03-20 DIAGNOSIS — M9906 Segmental and somatic dysfunction of lower extremity: Secondary | ICD-10-CM | POA: Diagnosis not present

## 2021-03-25 ENCOUNTER — Ambulatory Visit: Payer: Medicare PPO | Admitting: Gastroenterology

## 2021-04-03 DIAGNOSIS — Z4421 Encounter for fitting and adjustment of artificial right eye: Secondary | ICD-10-CM | POA: Diagnosis not present

## 2021-04-21 DIAGNOSIS — M150 Primary generalized (osteo)arthritis: Secondary | ICD-10-CM | POA: Diagnosis not present

## 2021-04-21 DIAGNOSIS — Z6832 Body mass index (BMI) 32.0-32.9, adult: Secondary | ICD-10-CM | POA: Diagnosis not present

## 2021-04-21 DIAGNOSIS — R768 Other specified abnormal immunological findings in serum: Secondary | ICD-10-CM | POA: Diagnosis not present

## 2021-04-21 DIAGNOSIS — M0609 Rheumatoid arthritis without rheumatoid factor, multiple sites: Secondary | ICD-10-CM | POA: Diagnosis not present

## 2021-04-21 DIAGNOSIS — M5136 Other intervertebral disc degeneration, lumbar region: Secondary | ICD-10-CM | POA: Diagnosis not present

## 2021-04-21 DIAGNOSIS — E669 Obesity, unspecified: Secondary | ICD-10-CM | POA: Diagnosis not present

## 2021-04-21 DIAGNOSIS — M797 Fibromyalgia: Secondary | ICD-10-CM | POA: Diagnosis not present

## 2021-04-22 DIAGNOSIS — M9903 Segmental and somatic dysfunction of lumbar region: Secondary | ICD-10-CM | POA: Diagnosis not present

## 2021-04-22 DIAGNOSIS — M25561 Pain in right knee: Secondary | ICD-10-CM | POA: Diagnosis not present

## 2021-04-22 DIAGNOSIS — M9906 Segmental and somatic dysfunction of lower extremity: Secondary | ICD-10-CM | POA: Diagnosis not present

## 2021-04-22 DIAGNOSIS — M542 Cervicalgia: Secondary | ICD-10-CM | POA: Diagnosis not present

## 2021-04-22 DIAGNOSIS — M62830 Muscle spasm of back: Secondary | ICD-10-CM | POA: Diagnosis not present

## 2021-04-22 DIAGNOSIS — M9905 Segmental and somatic dysfunction of pelvic region: Secondary | ICD-10-CM | POA: Diagnosis not present

## 2021-04-22 DIAGNOSIS — M9901 Segmental and somatic dysfunction of cervical region: Secondary | ICD-10-CM | POA: Diagnosis not present

## 2021-04-22 DIAGNOSIS — M546 Pain in thoracic spine: Secondary | ICD-10-CM | POA: Diagnosis not present

## 2021-04-22 DIAGNOSIS — M9902 Segmental and somatic dysfunction of thoracic region: Secondary | ICD-10-CM | POA: Diagnosis not present

## 2021-05-17 DIAGNOSIS — M797 Fibromyalgia: Secondary | ICD-10-CM | POA: Diagnosis not present

## 2021-05-17 DIAGNOSIS — E785 Hyperlipidemia, unspecified: Secondary | ICD-10-CM | POA: Diagnosis not present

## 2021-05-20 DIAGNOSIS — M542 Cervicalgia: Secondary | ICD-10-CM | POA: Diagnosis not present

## 2021-05-20 DIAGNOSIS — M9906 Segmental and somatic dysfunction of lower extremity: Secondary | ICD-10-CM | POA: Diagnosis not present

## 2021-05-20 DIAGNOSIS — M25561 Pain in right knee: Secondary | ICD-10-CM | POA: Diagnosis not present

## 2021-05-20 DIAGNOSIS — M546 Pain in thoracic spine: Secondary | ICD-10-CM | POA: Diagnosis not present

## 2021-05-20 DIAGNOSIS — M9905 Segmental and somatic dysfunction of pelvic region: Secondary | ICD-10-CM | POA: Diagnosis not present

## 2021-05-20 DIAGNOSIS — M9903 Segmental and somatic dysfunction of lumbar region: Secondary | ICD-10-CM | POA: Diagnosis not present

## 2021-05-20 DIAGNOSIS — M9902 Segmental and somatic dysfunction of thoracic region: Secondary | ICD-10-CM | POA: Diagnosis not present

## 2021-05-20 DIAGNOSIS — M9901 Segmental and somatic dysfunction of cervical region: Secondary | ICD-10-CM | POA: Diagnosis not present

## 2021-05-20 DIAGNOSIS — M62830 Muscle spasm of back: Secondary | ICD-10-CM | POA: Diagnosis not present

## 2021-06-11 DIAGNOSIS — F4322 Adjustment disorder with anxiety: Secondary | ICD-10-CM | POA: Diagnosis not present

## 2021-06-17 DIAGNOSIS — M9906 Segmental and somatic dysfunction of lower extremity: Secondary | ICD-10-CM | POA: Diagnosis not present

## 2021-06-17 DIAGNOSIS — M542 Cervicalgia: Secondary | ICD-10-CM | POA: Diagnosis not present

## 2021-06-17 DIAGNOSIS — M62830 Muscle spasm of back: Secondary | ICD-10-CM | POA: Diagnosis not present

## 2021-06-17 DIAGNOSIS — M546 Pain in thoracic spine: Secondary | ICD-10-CM | POA: Diagnosis not present

## 2021-06-17 DIAGNOSIS — M9905 Segmental and somatic dysfunction of pelvic region: Secondary | ICD-10-CM | POA: Diagnosis not present

## 2021-06-17 DIAGNOSIS — M9902 Segmental and somatic dysfunction of thoracic region: Secondary | ICD-10-CM | POA: Diagnosis not present

## 2021-06-17 DIAGNOSIS — M9901 Segmental and somatic dysfunction of cervical region: Secondary | ICD-10-CM | POA: Diagnosis not present

## 2021-06-17 DIAGNOSIS — M9903 Segmental and somatic dysfunction of lumbar region: Secondary | ICD-10-CM | POA: Diagnosis not present

## 2021-06-17 DIAGNOSIS — M25561 Pain in right knee: Secondary | ICD-10-CM | POA: Diagnosis not present

## 2021-06-18 DIAGNOSIS — F4322 Adjustment disorder with anxiety: Secondary | ICD-10-CM | POA: Diagnosis not present

## 2021-07-14 ENCOUNTER — Other Ambulatory Visit: Payer: Self-pay | Admitting: Gastroenterology

## 2021-07-14 DIAGNOSIS — K5900 Constipation, unspecified: Secondary | ICD-10-CM

## 2021-07-14 NOTE — Telephone Encounter (Signed)
Last ov 04/16/20 ?

## 2021-07-14 NOTE — Telephone Encounter (Signed)
Sending in limited refill.  Please let patient know she needs to keep her upcoming office visit in April to continue to get refills. ?

## 2021-07-22 DIAGNOSIS — M25561 Pain in right knee: Secondary | ICD-10-CM | POA: Diagnosis not present

## 2021-07-22 DIAGNOSIS — M9905 Segmental and somatic dysfunction of pelvic region: Secondary | ICD-10-CM | POA: Diagnosis not present

## 2021-07-22 DIAGNOSIS — M542 Cervicalgia: Secondary | ICD-10-CM | POA: Diagnosis not present

## 2021-07-22 DIAGNOSIS — M9901 Segmental and somatic dysfunction of cervical region: Secondary | ICD-10-CM | POA: Diagnosis not present

## 2021-07-22 DIAGNOSIS — M9902 Segmental and somatic dysfunction of thoracic region: Secondary | ICD-10-CM | POA: Diagnosis not present

## 2021-07-22 DIAGNOSIS — M9903 Segmental and somatic dysfunction of lumbar region: Secondary | ICD-10-CM | POA: Diagnosis not present

## 2021-07-22 DIAGNOSIS — M546 Pain in thoracic spine: Secondary | ICD-10-CM | POA: Diagnosis not present

## 2021-07-22 DIAGNOSIS — M62830 Muscle spasm of back: Secondary | ICD-10-CM | POA: Diagnosis not present

## 2021-07-22 DIAGNOSIS — M9906 Segmental and somatic dysfunction of lower extremity: Secondary | ICD-10-CM | POA: Diagnosis not present

## 2021-08-04 ENCOUNTER — Telehealth: Payer: Self-pay | Admitting: Radiology

## 2021-08-04 NOTE — Telephone Encounter (Signed)
Patient called LMVM, said that she wanted to move her appointment up to this week from next.  I called LM asking her to call us back to discuss.  ?

## 2021-08-06 ENCOUNTER — Emergency Department (HOSPITAL_COMMUNITY)
Admission: EM | Admit: 2021-08-06 | Discharge: 2021-08-06 | Disposition: A | Discharge status: Home / Self Care | Payer: Medicare PPO | Attending: Emergency Medicine | Admitting: Emergency Medicine

## 2021-08-06 ENCOUNTER — Emergency Department (HOSPITAL_COMMUNITY): Payer: Medicare PPO

## 2021-08-06 ENCOUNTER — Other Ambulatory Visit: Payer: Self-pay

## 2021-08-06 ENCOUNTER — Encounter (HOSPITAL_COMMUNITY): Payer: Self-pay

## 2021-08-06 DIAGNOSIS — S86911A Strain of unspecified muscle(s) and tendon(s) at lower leg level, right leg, initial encounter: Secondary | ICD-10-CM | POA: Insufficient documentation

## 2021-08-06 DIAGNOSIS — M1711 Unilateral primary osteoarthritis, right knee: Secondary | ICD-10-CM | POA: Diagnosis not present

## 2021-08-06 DIAGNOSIS — S8991XA Unspecified injury of right lower leg, initial encounter: Secondary | ICD-10-CM | POA: Diagnosis present

## 2021-08-06 DIAGNOSIS — Y9389 Activity, other specified: Secondary | ICD-10-CM | POA: Insufficient documentation

## 2021-08-06 DIAGNOSIS — X58XXXA Exposure to other specified factors, initial encounter: Secondary | ICD-10-CM | POA: Diagnosis not present

## 2021-08-06 DIAGNOSIS — Z9882 Breast implant status: Secondary | ICD-10-CM | POA: Diagnosis not present

## 2021-08-06 DIAGNOSIS — T148XXA Other injury of unspecified body region, initial encounter: Secondary | ICD-10-CM

## 2021-08-06 DIAGNOSIS — S39012A Strain of muscle, fascia and tendon of lower back, initial encounter: Secondary | ICD-10-CM | POA: Insufficient documentation

## 2021-08-06 DIAGNOSIS — M546 Pain in thoracic spine: Secondary | ICD-10-CM | POA: Diagnosis not present

## 2021-08-06 DIAGNOSIS — M25569 Pain in unspecified knee: Secondary | ICD-10-CM | POA: Diagnosis not present

## 2021-08-06 DIAGNOSIS — Y92096 Garden or yard of other non-institutional residence as the place of occurrence of the external cause: Secondary | ICD-10-CM | POA: Insufficient documentation

## 2021-08-06 DIAGNOSIS — I700 Atherosclerosis of aorta: Secondary | ICD-10-CM | POA: Diagnosis not present

## 2021-08-06 DIAGNOSIS — M25561 Pain in right knee: Secondary | ICD-10-CM | POA: Diagnosis not present

## 2021-08-06 DIAGNOSIS — M545 Low back pain, unspecified: Secondary | ICD-10-CM | POA: Diagnosis not present

## 2021-08-06 DIAGNOSIS — N200 Calculus of kidney: Secondary | ICD-10-CM | POA: Diagnosis not present

## 2021-08-06 LAB — URINALYSIS, ROUTINE W REFLEX MICROSCOPIC
Bilirubin Urine: NEGATIVE
Glucose, UA: NEGATIVE mg/dL
Hgb urine dipstick: NEGATIVE
Ketones, ur: NEGATIVE mg/dL
Leukocytes,Ua: NEGATIVE
Nitrite: NEGATIVE
Protein, ur: NEGATIVE mg/dL
Specific Gravity, Urine: 1.021 (ref 1.005–1.030)
pH: 6 (ref 5.0–8.0)

## 2021-08-06 MED ORDER — MELOXICAM 7.5 MG PO TABS: 7.5000 mg | ORAL_TABLET | Freq: Every day | ORAL | 0 refills | Status: DC

## 2021-08-06 MED ORDER — ONDANSETRON 8 MG PO TBDP
8.0000 mg | ORAL_TABLET | Freq: Once | ORAL | Status: AC
  Administered 2021-08-06: 8 mg via ORAL
  Filled 2021-08-06: qty 1

## 2021-08-06 MED ORDER — OXYCODONE-ACETAMINOPHEN 5-325 MG PO TABS
1.0000 | ORAL_TABLET | Freq: Once | ORAL | Status: AC
  Administered 2021-08-06: 1 via ORAL
  Filled 2021-08-06: qty 1

## 2021-08-06 MED ORDER — IBUPROFEN 400 MG PO TABS
400.0000 mg | ORAL_TABLET | Freq: Once | ORAL | Status: AC
  Administered 2021-08-06: 400 mg via ORAL
  Filled 2021-08-06: qty 1

## 2021-08-06 NOTE — ED Notes (Signed)
Urine specimen sent to lab

## 2021-08-06 NOTE — ED Provider Notes (Signed)
?[facility] ?Provider Note ? ? ?CSN: [id number] ?Arrival date & time: 08/06/21  0444 ? ?  ? ?History ? ?Chief Complaint  ?Patient presents with  ? Back Pain  ? ? ?Whitney Moreno is a 71 y.o. female. ? ?Patient presents to the emergency department for evaluation of right knee pain and low back pain.  Patient reports that her right knee has been hurting for approximately a week.  She has had problems with this knee in the past, normally the pain flares up when she does something active.  The pain started this time without any precipitating exercise.  Patient reports pain on the medial aspect of the knee that worsens with bending the knee.  No known injury. ? ?Patient comes to the ED tonight because she is having low back pain.  Pain is in the left side of the lower back and radiates to the hip area.  She reports that she worked in her yard yesterday and thinks she might of hurt herself, but did not note any discrete injuries or sudden onset of pain while she was working.  The pain started later in the night.  No urinary symptoms. ? ? ?  ? ?Home Medications ?Prior to Admission medications   ?Medication Sig Start Date End Date Taking? Authorizing Provider  ?meloxicam (MOBIC) 7.5 MG tablet Take 1 tablet (7.5 mg total) by mouth daily. 08/06/21  Yes Malia Corsi, Gwenyth Allegra, MD  ?acetaminophen (TYLENOL) 325 MG tablet Take 650 mg by mouth every 6 (six) hours as needed for moderate pain. 10/17/15   [provider]  ?APPLE CIDER VINEGAR PO Take 4 tablets by mouth daily.    [provider]  ?Calcium-Vitamin D 600-200 MG-UNIT per tablet Take 2 tablets by mouth daily.    [provider]  ?Cholecalciferol (VITAMIN D3) 250 MCG (10000 UT) capsule Take 10,000 Units by mouth daily.    [provider]  ?CYMBALTA 60 MG capsule Take 60 mg by mouth daily.  09/10/10   [provider]  ?Diclofenac Sodium 1 % CREA 4 gram qid as needed 07/03/20   Marcial Pacas, MD  ?folic acid  (FOLVITE) 1 MG tablet Take 4 mg by mouth daily. 08/31/12   [provider]  ?gabapentin (NEURONTIN) 300 MG capsule Take 2 capsules (600 mg total) by mouth at bedtime. 07/03/20   Marcial Pacas, MD  ?hydroquinone 4 % cream Apply 1 application topically daily. 11/29/19   [provider]  ?hydroxychloroquine (PLAQUENIL) 200 MG tablet Take 200 mg by mouth every Monday, Wednesday, and Friday. 08/13/19   [provider]  ?ibuprofen (ADVIL,MOTRIN) 200 MG tablet Take 200 mg by mouth every 6 (six) hours as needed for moderate pain.    [provider]  ?lactulose (CHRONULAC) 10 GM/15ML solution TAKE 15 ML BY MOUTH DAILY 07/14/21   Erenest Rasher, PA-C  ?lidocaine-prilocaine (EMLA) cream 4 gram qid as needed 07/03/20   Marcial Pacas, MD  ?methotrexate (RHEUMATREX) 2.5 MG tablet Take 15 mg by mouth every Sunday. Caution:Chemotherapy. Protect from light. Takes on Sundays.    [provider]  ?Multiple Vitamins-Minerals (MULTIVITAMIN WOMENS 50+ ADV PO) Take 1 tablet by mouth daily.    [provider]  ?polyethylene glycol-electrolytes (TRILYTE) 420 g solution Take 4,000 mLs by mouth as directed. 06/17/20   Rourk, Cristopher Estimable, MD  ?zinc gluconate 50 MG tablet Take 50 mg by mouth daily.    [provider]  ?   ? ?Allergies    ?Patient  has no known allergies.   ? ?Review of Systems   ?Review of Systems  ?Musculoskeletal:  Positive for arthralgias and back pain.  ? ?Physical Exam ?Updated Vital Signs ?BP 112/61   Pulse 67   Temp 98.2 ?F (36.8 ?C) (Oral)   Resp 18   Ht '5\' 6"'$  (1.676 m)   Wt 95.3 kg   LMP 02/16/2008   SpO2 100%   BMI 33.89 kg/m?  ?Physical Exam ?Vitals and nursing note reviewed.  ?Constitutional:   ?   General: She is not in acute distress. ?   Appearance: She is well-developed.  ?HENT:  ?   Head: Normocephalic and atraumatic.  ?   Mouth/Throat:  ?   Mouth: Mucous membranes are moist.  ?Eyes:  ?   General: Vision grossly intact. Gaze aligned appropriately.  ?    Extraocular Movements: Extraocular movements intact.  ?   Conjunctiva/sclera: Conjunctivae normal.  ?Cardiovascular:  ?   Rate and Rhythm: Normal rate and regular rhythm.  ?   Pulses: Normal pulses.  ?   Heart sounds: Normal heart sounds, S1 normal and S2 normal. No murmur heard. ?  No friction rub. No gallop.  ?Pulmonary:  ?   Effort: Pulmonary effort is normal. No respiratory distress.  ?   Breath sounds: Normal breath sounds.  ?Abdominal:  ?   General: Bowel sounds are normal.  ?   Palpations: Abdomen is soft.  ?   Tenderness: There is no abdominal tenderness. There is no guarding or rebound.  ?   Hernia: No hernia is present.  ?Musculoskeletal:     ?   General: No swelling.  ?   Cervical back: Full passive range of motion without pain, normal range of motion and neck supple. No spinous process tenderness or muscular tenderness. Normal range of motion.  ?   Thoracic back: Normal.  ?   Lumbar back: Tenderness present.  ?     Back: ? ?   Right knee: No swelling, deformity, effusion or erythema. Normal range of motion. Tenderness present.  ?   Right lower leg: No edema.  ?   Left lower leg: No edema.  ?Skin: ?   General: Skin is warm and dry.  ?   Capillary Refill: Capillary refill takes less than 2 seconds.  ?   Findings: No ecchymosis, erythema, rash or wound.  ?Neurological:  ?   General: No focal deficit present.  ?   Mental Status: She is alert and oriented to person, place, and time.  ?   GCS: GCS eye subscore is 4. GCS verbal subscore is 5. GCS motor subscore is 6.  ?   Cranial Nerves: Cranial nerves 2-12 are intact.  ?   Sensory: Sensation is intact.  ?   Motor: Motor function is intact.  ?   Coordination: Coordination is intact.  ?Psychiatric:     ?   Attention and Perception: Attention normal.     ?   Mood and Affect: Mood normal.     ?   Speech: Speech normal.     ?   Behavior: Behavior normal.  ? ? ?ED Results / Procedures / Treatments   ?Labs ?(all labs ordered are listed, but only abnormal results are  displayed) ?Labs Reviewed  ?URINALYSIS, ROUTINE W REFLEX MICROSCOPIC  ? ? ?EKG ?None ? ?Radiology ?DG Knee Complete 4 Views Right ? ?Result Date: 08/06/2021 ?CLINICAL DATA:  71 year old female with history of right knee pain. EXAM: RIGHT KNEE - COMPLETE 4+  VIEW COMPARISON:  No priors. FINDINGS: Four views of the right knee demonstrate no acute displaced fracture, subluxation or dislocation. There is advanced joint space narrowing, subchondral sclerosis, subchondral cyst formation and osteophyte formation in a tricompartmental distribution, most severe in the patellofemoral compartment, indicative of severe osteoarthritis. IMPRESSION: 1. No acute radiographic abnormality of the right knee. 2. Severe tricompartmental osteoarthritis, most severe in the patellofemoral compartment. Electronically Signed   By: Vinnie Langton M.D.   On: 08/06/2021 06:36  ? ?CT RENAL STONE STUDY ? ?Result Date: 08/06/2021 ?CLINICAL DATA:  71 year old female with history of left-sided lower back and flank pain. EXAM: CT ABDOMEN AND PELVIS WITHOUT CONTRAST TECHNIQUE: Multidetector CT imaging of the abdomen and pelvis was performed following the standard protocol without IV contrast. RADIATION DOSE REDUCTION: This exam was performed according to the departmental dose-optimization program which includes automated exposure control, adjustment of the mA and/or kV according to patient size and/or use of iterative reconstruction technique. COMPARISON:  CT the abdomen and pelvis 08/12/2013. FINDINGS: Lower chest: Bilateral breast implants incidentally noted. Atherosclerotic calcifications in the descending thoracic aorta. Hepatobiliary: No definite suspicious cystic or solid hepatic lesions are confidently identified on today's noncontrast CT examination. Status post cholecystectomy. Pancreas: No definite pancreatic mass or peripancreatic fluid collections or inflammatory changes are noted on today's noncontrast CT examination. Spleen: Spleen is  diminutive in size, but otherwise unremarkable in appearance. Adrenals/Urinary Tract: Nonobstructive calculi are noted within both renal collecting systems, largest of which is in the interpolar collecting system of the le

## 2021-08-06 NOTE — ED Triage Notes (Signed)
Pt from home c/o left sided lower back and right knee pain after working in her yard. Back pain since 7pm last night and knee pain for a week. Took oxycodone at 300 pta.  ?

## 2021-08-07 ENCOUNTER — Ambulatory Visit: Payer: Medicare PPO | Admitting: Orthopedic Surgery

## 2021-08-10 ENCOUNTER — Ambulatory Visit: Payer: Medicare PPO | Admitting: Orthopedic Surgery

## 2021-08-12 ENCOUNTER — Ambulatory Visit: Payer: Medicare PPO | Admitting: Gastroenterology

## 2021-08-12 DIAGNOSIS — M9902 Segmental and somatic dysfunction of thoracic region: Secondary | ICD-10-CM | POA: Diagnosis not present

## 2021-08-12 DIAGNOSIS — M9903 Segmental and somatic dysfunction of lumbar region: Secondary | ICD-10-CM | POA: Diagnosis not present

## 2021-08-12 DIAGNOSIS — M5442 Lumbago with sciatica, left side: Secondary | ICD-10-CM | POA: Diagnosis not present

## 2021-08-12 DIAGNOSIS — M542 Cervicalgia: Secondary | ICD-10-CM | POA: Diagnosis not present

## 2021-08-12 DIAGNOSIS — M546 Pain in thoracic spine: Secondary | ICD-10-CM | POA: Diagnosis not present

## 2021-08-12 DIAGNOSIS — M9901 Segmental and somatic dysfunction of cervical region: Secondary | ICD-10-CM | POA: Diagnosis not present

## 2021-08-12 DIAGNOSIS — M9905 Segmental and somatic dysfunction of pelvic region: Secondary | ICD-10-CM | POA: Diagnosis not present

## 2021-08-14 DIAGNOSIS — M546 Pain in thoracic spine: Secondary | ICD-10-CM | POA: Diagnosis not present

## 2021-08-14 DIAGNOSIS — M9902 Segmental and somatic dysfunction of thoracic region: Secondary | ICD-10-CM | POA: Diagnosis not present

## 2021-08-14 DIAGNOSIS — M5442 Lumbago with sciatica, left side: Secondary | ICD-10-CM | POA: Diagnosis not present

## 2021-08-14 DIAGNOSIS — M9901 Segmental and somatic dysfunction of cervical region: Secondary | ICD-10-CM | POA: Diagnosis not present

## 2021-08-14 DIAGNOSIS — M542 Cervicalgia: Secondary | ICD-10-CM | POA: Diagnosis not present

## 2021-08-14 DIAGNOSIS — M9905 Segmental and somatic dysfunction of pelvic region: Secondary | ICD-10-CM | POA: Diagnosis not present

## 2021-08-14 DIAGNOSIS — M9903 Segmental and somatic dysfunction of lumbar region: Secondary | ICD-10-CM | POA: Diagnosis not present

## 2021-08-17 ENCOUNTER — Ambulatory Visit (INDEPENDENT_AMBULATORY_CARE_PROVIDER_SITE_OTHER): Payer: Medicare PPO

## 2021-08-17 ENCOUNTER — Ambulatory Visit: Payer: Medicare PPO | Admitting: Orthopedic Surgery

## 2021-08-17 DIAGNOSIS — M9905 Segmental and somatic dysfunction of pelvic region: Secondary | ICD-10-CM | POA: Diagnosis not present

## 2021-08-17 DIAGNOSIS — M9902 Segmental and somatic dysfunction of thoracic region: Secondary | ICD-10-CM | POA: Diagnosis not present

## 2021-08-17 DIAGNOSIS — M542 Cervicalgia: Secondary | ICD-10-CM | POA: Diagnosis not present

## 2021-08-17 DIAGNOSIS — M546 Pain in thoracic spine: Secondary | ICD-10-CM | POA: Diagnosis not present

## 2021-08-17 DIAGNOSIS — M9901 Segmental and somatic dysfunction of cervical region: Secondary | ICD-10-CM | POA: Diagnosis not present

## 2021-08-17 DIAGNOSIS — M7051 Other bursitis of knee, right knee: Secondary | ICD-10-CM | POA: Diagnosis not present

## 2021-08-17 DIAGNOSIS — M25561 Pain in right knee: Secondary | ICD-10-CM

## 2021-08-17 DIAGNOSIS — M1711 Unilateral primary osteoarthritis, right knee: Secondary | ICD-10-CM

## 2021-08-17 DIAGNOSIS — M545 Low back pain, unspecified: Secondary | ICD-10-CM | POA: Diagnosis not present

## 2021-08-17 DIAGNOSIS — M9903 Segmental and somatic dysfunction of lumbar region: Secondary | ICD-10-CM | POA: Diagnosis not present

## 2021-08-17 DIAGNOSIS — M5442 Lumbago with sciatica, left side: Secondary | ICD-10-CM | POA: Diagnosis not present

## 2021-08-17 MED ORDER — PREDNISONE 10 MG (48) PO TBPK: ORAL_TABLET | Freq: Every day | ORAL | 0 refills | Status: DC

## 2021-08-17 MED ORDER — TIZANIDINE HCL 4 MG PO TABS: 4.0000 mg | ORAL_TABLET | Freq: Three times a day (TID) | ORAL | 1 refills | Status: DC
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
## 2021-08-18 ENCOUNTER — Encounter: Payer: Self-pay | Admitting: Orthopedic Surgery

## 2021-08-19 DIAGNOSIS — M9905 Segmental and somatic dysfunction of pelvic region: Secondary | ICD-10-CM | POA: Diagnosis not present

## 2021-08-19 DIAGNOSIS — M9901 Segmental and somatic dysfunction of cervical region: Secondary | ICD-10-CM | POA: Diagnosis not present

## 2021-08-19 DIAGNOSIS — M5442 Lumbago with sciatica, left side: Secondary | ICD-10-CM | POA: Diagnosis not present

## 2021-08-19 DIAGNOSIS — M546 Pain in thoracic spine: Secondary | ICD-10-CM | POA: Diagnosis not present

## 2021-08-19 DIAGNOSIS — M9903 Segmental and somatic dysfunction of lumbar region: Secondary | ICD-10-CM | POA: Diagnosis not present

## 2021-08-19 DIAGNOSIS — M542 Cervicalgia: Secondary | ICD-10-CM | POA: Diagnosis not present

## 2021-08-19 DIAGNOSIS — M9902 Segmental and somatic dysfunction of thoracic region: Secondary | ICD-10-CM | POA: Diagnosis not present

## 2021-08-24 DIAGNOSIS — M9901 Segmental and somatic dysfunction of cervical region: Secondary | ICD-10-CM | POA: Diagnosis not present

## 2021-08-24 DIAGNOSIS — M9903 Segmental and somatic dysfunction of lumbar region: Secondary | ICD-10-CM | POA: Diagnosis not present

## 2021-08-24 DIAGNOSIS — M5442 Lumbago with sciatica, left side: Secondary | ICD-10-CM | POA: Diagnosis not present

## 2021-08-24 DIAGNOSIS — M9902 Segmental and somatic dysfunction of thoracic region: Secondary | ICD-10-CM | POA: Diagnosis not present

## 2021-08-24 DIAGNOSIS — M546 Pain in thoracic spine: Secondary | ICD-10-CM | POA: Diagnosis not present

## 2021-08-24 DIAGNOSIS — M9905 Segmental and somatic dysfunction of pelvic region: Secondary | ICD-10-CM | POA: Diagnosis not present

## 2021-08-24 DIAGNOSIS — M542 Cervicalgia: Secondary | ICD-10-CM | POA: Diagnosis not present

## 2021-08-31 DIAGNOSIS — M9905 Segmental and somatic dysfunction of pelvic region: Secondary | ICD-10-CM | POA: Diagnosis not present

## 2021-08-31 DIAGNOSIS — M9903 Segmental and somatic dysfunction of lumbar region: Secondary | ICD-10-CM | POA: Diagnosis not present

## 2021-08-31 DIAGNOSIS — M542 Cervicalgia: Secondary | ICD-10-CM | POA: Diagnosis not present

## 2021-08-31 DIAGNOSIS — M9902 Segmental and somatic dysfunction of thoracic region: Secondary | ICD-10-CM | POA: Diagnosis not present

## 2021-08-31 DIAGNOSIS — M5442 Lumbago with sciatica, left side: Secondary | ICD-10-CM | POA: Diagnosis not present

## 2021-08-31 DIAGNOSIS — M9901 Segmental and somatic dysfunction of cervical region: Secondary | ICD-10-CM | POA: Diagnosis not present

## 2021-08-31 DIAGNOSIS — M546 Pain in thoracic spine: Secondary | ICD-10-CM | POA: Diagnosis not present

## 2021-09-25 ENCOUNTER — Other Ambulatory Visit: Payer: Self-pay | Admitting: Gastroenterology

## 2021-09-25 DIAGNOSIS — K5900 Constipation, unspecified: Secondary | ICD-10-CM

## 2021-09-30 DIAGNOSIS — M5442 Lumbago with sciatica, left side: Secondary | ICD-10-CM | POA: Diagnosis not present

## 2021-09-30 DIAGNOSIS — M9902 Segmental and somatic dysfunction of thoracic region: Secondary | ICD-10-CM | POA: Diagnosis not present

## 2021-09-30 DIAGNOSIS — M542 Cervicalgia: Secondary | ICD-10-CM | POA: Diagnosis not present

## 2021-09-30 DIAGNOSIS — M546 Pain in thoracic spine: Secondary | ICD-10-CM | POA: Diagnosis not present

## 2021-09-30 DIAGNOSIS — M9903 Segmental and somatic dysfunction of lumbar region: Secondary | ICD-10-CM | POA: Diagnosis not present

## 2021-09-30 DIAGNOSIS — M9905 Segmental and somatic dysfunction of pelvic region: Secondary | ICD-10-CM | POA: Diagnosis not present

## 2021-09-30 DIAGNOSIS — M9901 Segmental and somatic dysfunction of cervical region: Secondary | ICD-10-CM | POA: Diagnosis not present

## 2021-10-01 ENCOUNTER — Encounter (HOSPITAL_COMMUNITY): Payer: Self-pay

## 2021-10-01 ENCOUNTER — Other Ambulatory Visit (HOSPITAL_COMMUNITY): Payer: Self-pay | Admitting: Internal Medicine

## 2021-10-01 ENCOUNTER — Ambulatory Visit (HOSPITAL_COMMUNITY)
Admission: RE | Admit: 2021-10-01 | Discharge: 2021-10-01 | Disposition: A | Discharge status: Home / Self Care | Payer: Medicare PPO | Source: Ambulatory Visit | Attending: Internal Medicine | Admitting: Internal Medicine

## 2021-10-01 DIAGNOSIS — Z1231 Encounter for screening mammogram for malignant neoplasm of breast: Secondary | ICD-10-CM

## 2021-10-07 ENCOUNTER — Other Ambulatory Visit: Payer: Self-pay | Admitting: Neurology

## 2021-10-09 DIAGNOSIS — M9903 Segmental and somatic dysfunction of lumbar region: Secondary | ICD-10-CM | POA: Diagnosis not present

## 2021-10-09 DIAGNOSIS — M9901 Segmental and somatic dysfunction of cervical region: Secondary | ICD-10-CM | POA: Diagnosis not present

## 2021-10-09 DIAGNOSIS — M546 Pain in thoracic spine: Secondary | ICD-10-CM | POA: Diagnosis not present

## 2021-10-09 DIAGNOSIS — M5442 Lumbago with sciatica, left side: Secondary | ICD-10-CM | POA: Diagnosis not present

## 2021-10-09 DIAGNOSIS — M9902 Segmental and somatic dysfunction of thoracic region: Secondary | ICD-10-CM | POA: Diagnosis not present

## 2021-10-09 DIAGNOSIS — M9905 Segmental and somatic dysfunction of pelvic region: Secondary | ICD-10-CM | POA: Diagnosis not present

## 2021-10-09 DIAGNOSIS — M542 Cervicalgia: Secondary | ICD-10-CM | POA: Diagnosis not present

## 2021-10-12 ENCOUNTER — Inpatient Hospital Stay
Admission: RE | Admit: 2021-10-12 | Discharge: 2021-10-12 | Disposition: A | Discharge status: Home / Self Care | Payer: Self-pay | Source: Ambulatory Visit | Attending: Internal Medicine | Admitting: Internal Medicine

## 2021-10-12 ENCOUNTER — Other Ambulatory Visit (HOSPITAL_COMMUNITY): Payer: Self-pay | Admitting: Internal Medicine

## 2021-10-12 DIAGNOSIS — Z1231 Encounter for screening mammogram for malignant neoplasm of breast: Secondary | ICD-10-CM

## 2021-10-14 ENCOUNTER — Telehealth: Payer: Self-pay | Admitting: Neurology

## 2021-10-14 DIAGNOSIS — M542 Cervicalgia: Secondary | ICD-10-CM | POA: Diagnosis not present

## 2021-10-14 DIAGNOSIS — R7303 Prediabetes: Secondary | ICD-10-CM | POA: Diagnosis not present

## 2021-10-14 DIAGNOSIS — M9905 Segmental and somatic dysfunction of pelvic region: Secondary | ICD-10-CM | POA: Diagnosis not present

## 2021-10-14 DIAGNOSIS — M9902 Segmental and somatic dysfunction of thoracic region: Secondary | ICD-10-CM | POA: Diagnosis not present

## 2021-10-14 DIAGNOSIS — M069 Rheumatoid arthritis, unspecified: Secondary | ICD-10-CM | POA: Diagnosis not present

## 2021-10-14 DIAGNOSIS — Z79899 Other long term (current) drug therapy: Secondary | ICD-10-CM | POA: Diagnosis not present

## 2021-10-14 DIAGNOSIS — M9901 Segmental and somatic dysfunction of cervical region: Secondary | ICD-10-CM | POA: Diagnosis not present

## 2021-10-14 DIAGNOSIS — M546 Pain in thoracic spine: Secondary | ICD-10-CM | POA: Diagnosis not present

## 2021-10-14 DIAGNOSIS — E785 Hyperlipidemia, unspecified: Secondary | ICD-10-CM | POA: Diagnosis not present

## 2021-10-14 DIAGNOSIS — M5442 Lumbago with sciatica, left side: Secondary | ICD-10-CM | POA: Diagnosis not present

## 2021-10-14 DIAGNOSIS — M9903 Segmental and somatic dysfunction of lumbar region: Secondary | ICD-10-CM | POA: Diagnosis not present

## 2021-10-14 MED ORDER — GABAPENTIN 300 MG PO CAPS
600.0000 mg | ORAL_CAPSULE | Freq: Every day | ORAL | 0 refills | Status: DC
Start: 2021-10-14 — End: 2022-05-18

## 2021-10-14 NOTE — Telephone Encounter (Signed)
Pending appt 12/31/21. Refill sent to pharmacy.

## 2021-10-14 NOTE — Telephone Encounter (Signed)
Pt is requesting a refill for gabapentin (NEURONTIN) 300 MG capsule .  Pharmacy: [facility]

## 2021-10-16 DIAGNOSIS — Z0001 Encounter for general adult medical examination with abnormal findings: Secondary | ICD-10-CM | POA: Diagnosis not present

## 2021-10-16 DIAGNOSIS — Z23 Encounter for immunization: Secondary | ICD-10-CM | POA: Diagnosis not present

## 2021-10-16 DIAGNOSIS — M797 Fibromyalgia: Secondary | ICD-10-CM | POA: Diagnosis not present

## 2021-10-16 DIAGNOSIS — E785 Hyperlipidemia, unspecified: Secondary | ICD-10-CM | POA: Diagnosis not present

## 2021-10-16 DIAGNOSIS — R7301 Impaired fasting glucose: Secondary | ICD-10-CM | POA: Diagnosis not present

## 2021-10-16 DIAGNOSIS — M069 Rheumatoid arthritis, unspecified: Secondary | ICD-10-CM | POA: Diagnosis not present

## 2021-10-16 DIAGNOSIS — J189 Pneumonia, unspecified organism: Secondary | ICD-10-CM | POA: Diagnosis not present

## 2021-10-16 DIAGNOSIS — Z6834 Body mass index (BMI) 34.0-34.9, adult: Secondary | ICD-10-CM | POA: Diagnosis not present

## 2021-10-21 DIAGNOSIS — M5442 Lumbago with sciatica, left side: Secondary | ICD-10-CM | POA: Diagnosis not present

## 2021-10-21 DIAGNOSIS — M546 Pain in thoracic spine: Secondary | ICD-10-CM | POA: Diagnosis not present

## 2021-10-21 DIAGNOSIS — M9905 Segmental and somatic dysfunction of pelvic region: Secondary | ICD-10-CM | POA: Diagnosis not present

## 2021-10-21 DIAGNOSIS — M9903 Segmental and somatic dysfunction of lumbar region: Secondary | ICD-10-CM | POA: Diagnosis not present

## 2021-10-21 DIAGNOSIS — M9902 Segmental and somatic dysfunction of thoracic region: Secondary | ICD-10-CM | POA: Diagnosis not present

## 2021-10-21 DIAGNOSIS — M9901 Segmental and somatic dysfunction of cervical region: Secondary | ICD-10-CM | POA: Diagnosis not present

## 2021-10-21 DIAGNOSIS — M542 Cervicalgia: Secondary | ICD-10-CM | POA: Diagnosis not present

## 2021-10-28 DIAGNOSIS — M7551 Bursitis of right shoulder: Secondary | ICD-10-CM | POA: Diagnosis not present

## 2021-10-28 DIAGNOSIS — E669 Obesity, unspecified: Secondary | ICD-10-CM | POA: Diagnosis not present

## 2021-10-28 DIAGNOSIS — Z6832 Body mass index (BMI) 32.0-32.9, adult: Secondary | ICD-10-CM | POA: Diagnosis not present

## 2021-10-28 DIAGNOSIS — M0609 Rheumatoid arthritis without rheumatoid factor, multiple sites: Secondary | ICD-10-CM | POA: Diagnosis not present

## 2021-10-28 DIAGNOSIS — M1991 Primary osteoarthritis, unspecified site: Secondary | ICD-10-CM | POA: Diagnosis not present

## 2021-10-28 DIAGNOSIS — Z79899 Other long term (current) drug therapy: Secondary | ICD-10-CM | POA: Diagnosis not present

## 2021-10-28 DIAGNOSIS — M797 Fibromyalgia: Secondary | ICD-10-CM | POA: Diagnosis not present

## 2021-10-28 DIAGNOSIS — R768 Other specified abnormal immunological findings in serum: Secondary | ICD-10-CM | POA: Diagnosis not present

## 2021-10-28 DIAGNOSIS — M5136 Other intervertebral disc degeneration, lumbar region: Secondary | ICD-10-CM | POA: Diagnosis not present

## 2021-10-29 ENCOUNTER — Telehealth: Payer: Self-pay | Admitting: Orthopedic Surgery

## 2021-10-29 NOTE — Telephone Encounter (Signed)
Call returned in response to voice message received today - call did not go through or person was unable to hear - call disconnected; we will try back.

## 2021-11-02 ENCOUNTER — Telehealth: Payer: Self-pay | Admitting: Internal Medicine

## 2021-11-02 NOTE — Telephone Encounter (Signed)
Pt needs to speak to nurse about a medication. (Lactulose). She is aware of her OV in August and we haven't seen her since 2021. She is asking for a refill to be sent to [facility]. Please advise. [phone]

## 2021-11-03 ENCOUNTER — Other Ambulatory Visit: Payer: Self-pay | Admitting: Gastroenterology

## 2021-11-03 DIAGNOSIS — K5900 Constipation, unspecified: Secondary | ICD-10-CM

## 2021-11-03 MED ORDER — LACTULOSE 10 GM/15ML PO SOLN: ORAL | 0 refills | Status: DC

## 2021-11-03 NOTE — Telephone Encounter (Signed)
Spoke to pt, she would like Lactulose sent to [facility].

## 2021-11-03 NOTE — Telephone Encounter (Signed)
Rx sent 

## 2021-11-03 NOTE — Telephone Encounter (Signed)
Noted  

## 2021-11-11 ENCOUNTER — Telehealth: Payer: Self-pay | Admitting: Orthopedic Surgery

## 2021-11-11 NOTE — Telephone Encounter (Signed)
Patient called to relay that she would like to have the total knee surgery - in January of 2024, as she will have a sister here at that time. 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  Please call after 1:30pm any day to [phone]

## 2021-11-11 NOTE — Telephone Encounter (Signed)
She needs appointment in December to schedule /discuss surgery Will be out of work 12 weeks I called her to advise. Told her may be sooner just depends how she progresses with therapy

## 2021-11-20 DIAGNOSIS — Z4421 Encounter for fitting and adjustment of artificial right eye: Secondary | ICD-10-CM | POA: Diagnosis not present

## 2021-12-01 ENCOUNTER — Ambulatory Visit: Payer: Medicare PPO | Admitting: Internal Medicine

## 2021-12-01 ENCOUNTER — Encounter: Payer: Self-pay | Admitting: Internal Medicine

## 2021-12-01 VITALS — BP 118/77 | HR 87 | Temp 97.3°F | Ht 66.0 in | Wt 209.2 lb

## 2021-12-01 DIAGNOSIS — Z1211 Encounter for screening for malignant neoplasm of colon: Secondary | ICD-10-CM | POA: Diagnosis not present

## 2021-12-01 DIAGNOSIS — K219 Gastro-esophageal reflux disease without esophagitis: Secondary | ICD-10-CM | POA: Diagnosis not present

## 2021-12-01 DIAGNOSIS — K5900 Constipation, unspecified: Secondary | ICD-10-CM | POA: Diagnosis not present

## 2021-12-01 MED ORDER — PANTOPRAZOLE SODIUM 40 MG PO TBEC: 40.0000 mg | DELAYED_RELEASE_TABLET | Freq: Every day | ORAL | 11 refills | Status: DC
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
## 2021-12-01 NOTE — Patient Instructions (Signed)
It was good to see you again today!  GERD information provided  Begin pantoprazole 40 mg daily 30 minutes before breakfast each morning dispense 30 with 11 refills  As far as constipation is concerned, may increase lactulose up to 2 tablespoons daily.  Goal of 5 pounds of weight loss to be accomplished between now and your 82-monthfollow-up appointment.  For 1 more colonoscopy for screening purposes in 9 years  This visit here in 3 months

## 2021-12-09 ENCOUNTER — Telehealth: Payer: Self-pay

## 2021-12-09 NOTE — Telephone Encounter (Signed)
Patient left message wanting to get a letter for immigration.  She asked for a return call so she can explain what she is needing. [phone]

## 2021-12-10 NOTE — Telephone Encounter (Signed)
Left message need more information.

## 2021-12-15 ENCOUNTER — Encounter: Payer: Self-pay | Admitting: Radiology

## 2021-12-15 NOTE — Progress Notes (Unsigned)
Patient called LMVM stating call her back after 115pm- she is at work until that time.

## 2021-12-15 NOTE — Telephone Encounter (Signed)
She left message earlier wanting a call back this afternoon, I have called her back and left another message.

## 2021-12-16 ENCOUNTER — Other Ambulatory Visit: Payer: Self-pay | Admitting: Gastroenterology

## 2021-12-16 DIAGNOSIS — K5900 Constipation, unspecified: Secondary | ICD-10-CM

## 2021-12-18 NOTE — Telephone Encounter (Signed)
Unable to reach multiple attempts

## 2021-12-24 ENCOUNTER — Ambulatory Visit: Payer: Medicare PPO | Admitting: Neurology

## 2021-12-24 ENCOUNTER — Encounter: Payer: Self-pay | Admitting: Neurology

## 2021-12-24 VITALS — BP 125/75 | HR 68 | Ht 66.0 in | Wt 208.6 lb

## 2021-12-24 DIAGNOSIS — M792 Neuralgia and neuritis, unspecified: Secondary | ICD-10-CM

## 2021-12-24 DIAGNOSIS — G629 Polyneuropathy, unspecified: Secondary | ICD-10-CM | POA: Diagnosis not present

## 2021-12-24 MED ORDER — GABAPENTIN 100 MG PO CAPS: 100.0000 mg | ORAL_CAPSULE | Freq: Three times a day (TID) | ORAL | 11 refills | Status: DC | PRN
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
## 2021-12-31 ENCOUNTER — Ambulatory Visit: Payer: Medicare PPO | Admitting: Neurology

## 2022-01-11 ENCOUNTER — Ambulatory Visit: Payer: Medicare PPO | Admitting: Orthopedic Surgery

## 2022-01-11 DIAGNOSIS — M7051 Other bursitis of knee, right knee: Secondary | ICD-10-CM | POA: Diagnosis not present

## 2022-01-11 DIAGNOSIS — K5730 Diverticulosis of large intestine without perforation or abscess without bleeding: Secondary | ICD-10-CM | POA: Insufficient documentation

## 2022-01-11 DIAGNOSIS — M1711 Unilateral primary osteoarthritis, right knee: Secondary | ICD-10-CM | POA: Diagnosis not present

## 2022-01-11 DIAGNOSIS — R109 Unspecified abdominal pain: Secondary | ICD-10-CM | POA: Insufficient documentation

## 2022-01-11 DIAGNOSIS — R933 Abnormal findings on diagnostic imaging of other parts of digestive tract: Secondary | ICD-10-CM | POA: Insufficient documentation

## 2022-01-11 DIAGNOSIS — E669 Obesity, unspecified: Secondary | ICD-10-CM | POA: Insufficient documentation

## 2022-01-11 DIAGNOSIS — R141 Gas pain: Secondary | ICD-10-CM | POA: Insufficient documentation

## 2022-01-11 DIAGNOSIS — R7402 Elevation of levels of lactic acid dehydrogenase (LDH): Secondary | ICD-10-CM | POA: Insufficient documentation

## 2022-01-11 MED ORDER — METHYLPREDNISOLONE ACETATE 40 MG/ML IJ SUSP
40.0000 mg | Freq: Once | INTRAMUSCULAR | Status: AC
  Administered 2022-01-11: 40 mg via INTRA_ARTICULAR

## 2022-01-11 NOTE — Progress Notes (Signed)
Chief Complaint  Patient presents with   Follow-up    Recheck on right knee    71 year old female with rheumatoid arthritis has arthritis of her right knee she is contemplating surgery.  She is on multiple rheumatologic drugs  When she does decide to have knee replacement surgery her sister has to come from [place] to help her care for her ailing husband  However today, she comes in with worsening right knee pain on the medial side of the knee and the medial hamstrings and pes bursa complaining of a burning sensation that keeps her up at night she has tried multiple creams rubs lidocaine gel ice heat meloxicam with no relief.  The meloxicam gave very minimal relief  The pain is located over the pes bursa and medial hamstrings  Examination  Constitutional:    General appearance   moderate BMI normal grooming  Cardiovascular:  swelling none varicosities none palpation of pulses    deferred temperature normal edema none tenderness none  Lymph nodes deferred  Skin right knee normal  Neuro : coordination, GERD deep tendon reflexes deferred sensation normal  Psych: alert and oriented x 3     depression none anxiety none agitation none  Musculoskeletal gait antalgic favoring the right side  She has swelling along the medial hamstrings and that is the area of tenderness and burning.  The actual knee joint itself does not hurt when you palpate it.  She has normal flexion extension arc in terms of not having any pain there.  The joint is stable.  I recommend an injection I used a spinal needle to inject the Pez anserine insertion as well as the bursa  Procedure note right knee injection for bursitis   verbal consent was obtained to inject right knee PES BURSA  Timeout was completed to confirm the site of injection  The medications used were 40 mg of Depo-Medrol and 1% lidocaine 3 cc  Anesthesia was provided by ethyl chloride and the skin was prepped with alcohol.  After cleaning  the skin with alcohol a 25-gauge needle was used to inject the right knee bursa.  There were no complications and a sterile bandage was applied    I also ordered a compound in the following mixture  Compounding topical diclofenac 3% baclofen 2% lidocaine 5% menthol 1% 1 to 2 g 3-4 times a day  Return 4 to 6 weeks

## 2022-01-25 ENCOUNTER — Ambulatory Visit (INDEPENDENT_AMBULATORY_CARE_PROVIDER_SITE_OTHER): Payer: Medicare PPO | Admitting: Obstetrics & Gynecology

## 2022-01-25 ENCOUNTER — Encounter (HOSPITAL_BASED_OUTPATIENT_CLINIC_OR_DEPARTMENT_OTHER): Payer: Self-pay | Admitting: Obstetrics & Gynecology

## 2022-01-25 ENCOUNTER — Other Ambulatory Visit (HOSPITAL_COMMUNITY)
Admission: RE | Admit: 2022-01-25 | Discharge: 2022-01-25 | Disposition: A | Discharge status: Home / Self Care | Payer: Medicare PPO | Source: Ambulatory Visit | Attending: Obstetrics & Gynecology | Admitting: Obstetrics & Gynecology

## 2022-01-25 VITALS — BP 123/84 | HR 81 | Ht 66.0 in | Wt 208.2 lb

## 2022-01-25 DIAGNOSIS — Z124 Encounter for screening for malignant neoplasm of cervix: Secondary | ICD-10-CM | POA: Insufficient documentation

## 2022-01-25 DIAGNOSIS — Z9081 Acquired absence of spleen: Secondary | ICD-10-CM

## 2022-01-25 DIAGNOSIS — M05711 Rheumatoid arthritis with rheumatoid factor of right shoulder without organ or systems involvement: Secondary | ICD-10-CM

## 2022-01-25 DIAGNOSIS — Z8489 Family history of other specified conditions: Secondary | ICD-10-CM

## 2022-01-25 DIAGNOSIS — Z01419 Encounter for gynecological examination (general) (routine) without abnormal findings: Secondary | ICD-10-CM
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
## 2022-01-28 DIAGNOSIS — M0609 Rheumatoid arthritis without rheumatoid factor, multiple sites: Secondary | ICD-10-CM | POA: Diagnosis not present

## 2022-01-28 DIAGNOSIS — Z79899 Other long term (current) drug therapy: Secondary | ICD-10-CM | POA: Diagnosis not present

## 2022-02-02 LAB — CYTOLOGY - PAP: Diagnosis: NEGATIVE

## 2022-02-18 ENCOUNTER — Encounter: Payer: Self-pay | Admitting: *Deleted

## 2022-02-22 ENCOUNTER — Ambulatory Visit: Payer: Medicare PPO | Admitting: Orthopedic Surgery

## 2022-02-22 DIAGNOSIS — Z01818 Encounter for other preprocedural examination: Secondary | ICD-10-CM

## 2022-02-22 DIAGNOSIS — M1711 Unilateral primary osteoarthritis, right knee: Secondary | ICD-10-CM | POA: Diagnosis not present

## 2022-02-22 MED ORDER — BUPIVACAINE-MELOXICAM ER 200-6 MG/7ML IJ SOLN: 400.0000 mg | Freq: Once | INTRAMUSCULAR | Status: DC

## 2022-02-22 NOTE — Patient Instructions (Addendum)
Your surgery will be at [facility] by Dr Harrison  plan to be in hospital overnight. The hospital will contact you with a preoperative appointment to discuss Anesthesia.  Please arrive on time or 15 minutes early for the preoperative appointment, they have a very tight schedule if you are late or do not come in your surgery will be cancelled.  The phone number for the preop area is [phone]. Please bring your medications with you for the appointment. They will tell you the arrival time for surgery and medication instructions when you have your preoperative evaluation. Do not wear nail polish the day of your surgery and if you take Phentermine you need to stop this medication ONE WEEK prior to your surgery. f you take Invokana, Farxiga, Jardiance, or Steglatro) - Hold 72 hours before the procedure.  If you take Ozempic,  Bydureon or Trulicity do not take for 8 days before your surgery. If you take Victoza, Rybelsis, Saxenda or Adlyxi stop 24 hours before the procedure. Please arrive at the hospital 2 hours before procedure if scheduled at 9:30 or later in the day or at the time the nurse tells you at your preoperative visit.   If you have my chart do not use the time given in my chart use the time given to you by the nurse during your preoperative visit.   Your surgery  time may change. Please be available for phone calls the day of your surgery and the day before. The Short Stay department may need to discuss changes about your surgery time. Not reaching the you could lead to procedure delays and possible cancellation.  You must have a ride home and someone to stay with you for 24 to 48 hours. The person taking you home will receive and sign for the your discharge instructions.  Please be prepared to give your support person's name and telephone number to [facility] [employer]. Dr Harrison will need that name and phone number post procedure.   You will also get a call from a representative of [employer], they have a machine that you will use in the first few weeks after surgery. It is called a CPM.   You will have home physical therapy for 2 weeks after surgery, the home health agency will call you before or just following the surgery to set up visits. [facility] is the agency we normally use, unless you request another agency.   You will get a call also from outpatient therapy for therapy starting when the home therapy is done.  If you have questions or need to Reschedule the surgery, call the office ask for Chanya Chrisley.    You have decided to proceed with knee replacement surgery. You have decided not to continue with nonoperative measures such as but not limited to oral medication, weight loss, activity modification, physical therapy, bracing, or injection.  We will perform the procedure commonly known as total knee replacement. Some of the risks associated with knee replacement surgery include but are not limited to Bleeding Infection Swelling Stiffness Blood clot Pulmonary embolism  Loosening of the implant Pain that persists even after surgery  Infection is especially devastating complication of knee surgery although rare. If infection does occur your implant will usually have to be removed and several surgeries and antibiotics will be needed to eradicate the infection prior to performing a repeat replacement.   In some cases amputation is required to eradicate the infection. In other rare cases a knee fusion is   needed   In compliance with recent [place] law in federal regulation regarding opioid use and abuse and addiction, we will taper (stop) opioid medication after 2 weeks.  If you're not comfortable with these risks and would like to continue with nonoperative treatment please let Dr. Harrison know prior to your surgery.  
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
## 2022-03-01 ENCOUNTER — Other Ambulatory Visit: Payer: Self-pay | Admitting: Orthopedic Surgery

## 2022-03-01 MED ORDER — MELOXICAM 7.5 MG PO TABS: 7.5000 mg | ORAL_TABLET | Freq: Every day | ORAL | 1 refills | Status: DC

## 2022-03-01 NOTE — Telephone Encounter (Signed)
Patient came in to pick up FMLA paperwork.  I verified that Rx refill sent.

## 2022-03-01 NOTE — Telephone Encounter (Signed)
  Prescription Request  03/01/2022  Is this a "Controlled Substance" medicine? No  LOV: 02/22/2022   What is the name of the medication or equipment? meloxicam (MOBIC) 7.5 MG tablet   Have you contacted your pharmacy to request a refill? Yes   Which pharmacy would you like this sent to?  [facility], [place] [address] [place] [address] Phone: [phone] Fax: [phone]   Patient notified that their request is being sent to the clinical staff for review and that they should receive a response within 2 business days.   Please advise at [phone] (mobile)

## 2022-03-03 ENCOUNTER — Other Ambulatory Visit: Payer: Self-pay | Admitting: Orthopedic Surgery

## 2022-03-03 DIAGNOSIS — Z0289 Encounter for other administrative examinations: Secondary | ICD-10-CM

## 2022-03-03 DIAGNOSIS — M1711 Unilateral primary osteoarthritis, right knee: Secondary | ICD-10-CM

## 2022-03-18 ENCOUNTER — Other Ambulatory Visit: Payer: Self-pay | Admitting: Orthopedic Surgery

## 2022-03-18 DIAGNOSIS — M1711 Unilateral primary osteoarthritis, right knee: Secondary | ICD-10-CM

## 2022-03-23 NOTE — Addendum Note (Signed)
Addended byCandice Camp on: 03/23/2022 01:52 PM   Modules accepted: Orders

## 2022-03-24 NOTE — Patient Instructions (Signed)
Whitney Moreno  03/24/2022     '@[email or web address]'$ @   Your procedure is scheduled on  03/30/2022.   Report to [facility] at  0600 A.M.   Call this number if you have problems the morning of surgery:  [phone]  If you experience any cold or flu symptoms such as cough, fever, chills, shortness of breath, etc. between now and your scheduled surgery, please notify us at the above number.   Remember:  Do not eat or drink after midnight.      Take these medicines the morning of surgery with A SIP OF WATER           cymbalta, gabapentin, meloxicam(if needed), pantoprazole.     Do not wear jewelry, make-up or nail polish.  Do not wear lotions, powders, or perfumes, or deodorant.  Do not shave 48 hours prior to surgery.  Men may shave face and neck.  Do not bring valuables to the hospital.  [facility] is not responsible for any belongings or valuables.  Contacts, dentures or bridgework may not be worn into surgery.  Leave your suitcase in the car.  After surgery it may be brought to your room.  For patients admitted to the hospital, discharge time will be determined by your treatment team.  Patients discharged the day of surgery will not be allowed to drive home.    Special instructions:   DO NOT smoke tobacco or vape for 24 hours before your procedure.  Please read over the following fact sheets that you were given. Pain Booklet, Coughing and Deep Breathing, Blood Transfusion Information, Total Joint Packet, MRSA Information, Surgical Site Infection Prevention, Anesthesia Post-op Instructions, and Care and Recovery After Surgery      Total Knee Replacement, Care After This sheet gives you information about how to care for yourself after your procedure. Your health care provider may also give you more specific instructions. If you have problems or questions, contact your health care provider. What can I expect after the procedure? After the procedure, it is  common to have: Redness, pain, and swelling at the incision area. Stiffness. Discomfort. A small amount of blood or clear fluid coming from your incision. Follow these instructions at home: Medicines Take over-the-counter and prescription medicines only as told by your health care provider. If you were prescribed a blood thinner (anticoagulant), take it as told by your health care provider. Ask your health care provider if the medicine prescribed to you: Requires you to avoid driving or using machinery. Can cause constipation. You may need to take these actions to prevent or treat constipation: Drink enough fluid to keep your urine pale yellow. Take over-the-counter or prescription medicines. Eat foods that are high in fiber, such as beans, whole grains, and fresh fruits and vegetables. Limit foods that are high in fat and processed sugars, such as fried or sweet foods. Incision care  Follow instructions from your health care provider about how to take care of your incision. Make sure you: Wash your hands with soap and water for at least 20 seconds before and after you change your bandage (dressing). If soap and water are not available, use hand sanitizer. Change your dressing as told by your health care provider. Leave stitches (sutures), staples, skin glue, or adhesive strips in place. These skin closures may need to stay in place for 2 weeks or longer. If adhesive strip edges start to loosen and curl up, you may trim the  loose edges. Do not remove adhesive strips completely unless your health care provider tells you to do that. Do not take baths, swim, or use a hot tub until your health care provider approves. Check your incision area every day for signs of infection. Check for: More redness, swelling, or pain. More fluid or blood. Warmth. Pus or a bad smell. Activity Rest as told by your health care provider. Avoid sitting for a long time without moving. Get up to take short walks  every 1-2 hours. This is important to improve blood flow and breathing. Ask for help if you feel weak or unsteady. Follow instructions from your health care provider about using a walker, crutches, or a cane. You may use your legs to support (bear) your body weight as told by your health care provider. Follow instructions about how much weight you may safely support on your affected leg (weight-bearing restrictions). A physical therapist may show you how to get out of a bed and chair and how to go up and down stairs. You will first do this with a walker, crutches, or a cane and then without any of these devices. Once you are able to walk without a limp, you may stop using a walker, crutches, or a cane. Do exercises as told by your health care provider or physical therapist. Avoid high-impact activities, including running, jumping rope, and doing jumping jacks. Do not play contact sports until your health care provider approves. Return to your normal activities as told by your health care provider. Ask your health care provider what activities are safe for you. Managing pain, stiffness, and swelling  If directed, put ice on your knee. To do this: Put ice in a plastic bag or use the icing device (cold flow pad) that you were given. Follow instructions from your health care provider about how to use the icing device. Place a towel between your skin and the bag or between your skin and the icing device. Leave the ice on for 20 minutes, 2-3 times a day. Remove the ice if your skin turns bright red. This is very important. If you cannot feel pain, heat, or cold, you have a greater risk of damage to the area. Move your toes often to reduce stiffness and swelling. Raise (elevate) your leg above the level of your heart while you are sitting or lying down. Use several pillows to keep your leg straight. Do not put a pillow just under the knee. If the knee is bent for a long time, this may lead to  stiffness. Wear elastic knee support as told by your health care provider. Safety  To help prevent falls, keep floors clear of objects you may trip over. Place items that you may need within easy reach. Wear an apron or tool belt with pockets for carrying objects. This leaves your hands free to help with your balance. Ask your health care provider when it is safe to drive. General instructions Wear compression stockings as told by your health care provider. These stockings help to prevent blood clots and reduce swelling in your legs. Continue with breathing exercises. This helps prevent lung infection. Do not use any products that contain nicotine or tobacco. These products include cigarettes, chewing tobacco, and vaping devices, such as e-cigarettes. These can delay healing after surgery. If you need help quitting, ask your health care provider. Tell your health care provider if you plan to have dental work. Also: Tell your dentist about your joint replacement. Ask your health  care provider if there are any special instructions you need to follow before having dental care and routine cleanings. Keep all follow-up visits. This is important. Contact a health care provider if: You have a fever or chills. You have a cough or feel short of breath. Your medicine is not controlling your pain. You have any of these signs of infection: More redness, swelling, or pain around your incision. More fluid or blood coming from your incision. Warmth coming from your incision. Pus or a bad smell coming from your incision. You fall. Get help right away if: You have severe pain. You have trouble breathing. You have chest pain. You have redness, swelling, pain, or warmth in your calf or leg. Your incision breaks open after sutures or staples are removed. These symptoms may represent a serious problem that is an emergency. Do not wait to see if the symptoms will go away. Get medical help right away. Call  your local emergency services (911 in the [place] Do not drive yourself to the hospital. Summary After the procedure, it is common to have pain and swelling at the incision area, a small amount of blood or fluid coming from your incision, and stiffness. Follow instructions from your health care provider about how to take care of your incision. Use crutches, a walker, or a cane as told by your health care provider. This information is not intended to replace advice given to you by your health care provider. Make sure you discuss any questions you have with your health care provider. Document Revised: 09/25/2019 Document Reviewed: 09/25/2019 Elsevier Patient Education  [place] Anesthesia, Adult, Care After The following information offers guidance on how to care for yourself after your procedure. Your health care provider may also give you more specific instructions. If you have problems or questions, contact your health care provider. What can I expect after the procedure? After the procedure, it is common for people to: Have pain or discomfort at the IV site. Have nausea or vomiting. Have a sore throat or hoarseness. Have trouble concentrating. Feel cold or chills. Feel weak, sleepy, or tired (fatigue). Have soreness and body aches. These can affect parts of the body that were not involved in surgery. Follow these instructions at home: For the time period you were told by your health care provider:  Rest. Do not participate in activities where you could fall or become injured. Do not drive or use machinery. Do not drink alcohol. Do not take sleeping pills or medicines that cause drowsiness. Do not make important decisions or sign legal documents. Do not take care of children on your own. General instructions Drink enough fluid to keep your urine pale yellow. If you have sleep apnea, surgery and certain medicines can increase your risk for breathing problems. Follow  instructions from your health care provider about wearing your sleep device: Anytime you are sleeping, including during daytime naps. While taking prescription pain medicines, sleeping medicines, or medicines that make you drowsy. Return to your normal activities as told by your health care provider. Ask your health care provider what activities are safe for you. Take over-the-counter and prescription medicines only as told by your health care provider. Do not use any products that contain nicotine or tobacco. These products include cigarettes, chewing tobacco, and vaping devices, such as e-cigarettes. These can delay incision healing after surgery. If you need help quitting, ask your health care provider. Contact a health care provider if: You have nausea or vomiting that  does not get better with medicine. You vomit every time you eat or drink. You have pain that does not get better with medicine. You cannot urinate or have bloody urine. You develop a skin rash. You have a fever. Get help right away if: You have trouble breathing. You have chest pain. You vomit blood. These symptoms may be an emergency. Get help right away. Call 911. Do not wait to see if the symptoms will go away. Do not drive yourself to the hospital. Summary After the procedure, it is common to have a sore throat, hoarseness, nausea, vomiting, or to feel weak, sleepy, or fatigue. For the time period you were told by your health care provider, do not drive or use machinery. Get help right away if you have difficulty breathing, have chest pain, or vomit blood. These symptoms may be an emergency. This information is not intended to replace advice given to you by your health care provider. Make sure you discuss any questions you have with your health care provider. Document Revised: 07/03/2021 Document Reviewed: 07/03/2021 Elsevier Patient Education  [place]. How to Use Chlorhexidine Before  Surgery Chlorhexidine gluconate (CHG) is a germ-killing (antiseptic) solution that is used to clean the skin. It can get rid of the bacteria that normally live on the skin and can keep them away for about 24 hours. To clean your skin with CHG, you may be given: A CHG solution to use in the shower or as part of a sponge bath. A prepackaged cloth that contains CHG. Cleaning your skin with CHG may help lower the risk for infection: While you are staying in the intensive care unit of the hospital. If you have a vascular access, such as a central line, to provide short-term or long-term access to your veins. If you have a catheter to drain urine from your bladder. If you are on a ventilator. A ventilator is a machine that helps you breathe by moving air in and out of your lungs. After surgery. What are the risks? Risks of using CHG include: A skin reaction. Hearing loss, if CHG gets in your ears and you have a perforated eardrum. Eye injury, if CHG gets in your eyes and is not rinsed out. The CHG product catching fire. Make sure that you avoid smoking and flames after applying CHG to your skin. Do not use CHG: If you have a chlorhexidine allergy or have previously reacted to chlorhexidine. On babies younger than 20 months of age. How to use CHG solution Use CHG only as told by your health care provider, and follow the instructions on the label. Use the full amount of CHG as directed. Usually, this is one bottle. During a shower Follow these steps when using CHG solution during a shower (unless your health care provider gives you different instructions): Start the shower. Use your normal soap and shampoo to wash your face and hair. Turn off the shower or move out of the shower stream. Pour the CHG onto a clean washcloth. Do not use any type of brush or rough-edged sponge. Starting at your neck, lather your body down to your toes. Make sure you follow these instructions: If you will be having  surgery, pay special attention to the part of your body where you will be having surgery. Scrub this area for at least 1 minute. Do not use CHG on your head or face. If the solution gets into your ears or eyes, rinse them well with water. Avoid your genital  area. Avoid any areas of skin that have broken skin, cuts, or scrapes. Scrub your back and under your arms. Make sure to wash skin folds. Let the lather sit on your skin for 1-2 minutes or as long as told by your health care provider. Thoroughly rinse your entire body in the shower. Make sure that all body creases and crevices are rinsed well. Dry off with a clean towel. Do not put any substances on your body afterward--such as powder, lotion, or perfume--unless you are told to do so by your health care provider. Only use lotions that are recommended by the manufacturer. Put on clean clothes or pajamas. If it is the night before your surgery, sleep in clean sheets.  During a sponge bath Follow these steps when using CHG solution during a sponge bath (unless your health care provider gives you different instructions): Use your normal soap and shampoo to wash your face and hair. Pour the CHG onto a clean washcloth. Starting at your neck, lather your body down to your toes. Make sure you follow these instructions: If you will be having surgery, pay special attention to the part of your body where you will be having surgery. Scrub this area for at least 1 minute. Do not use CHG on your head or face. If the solution gets into your ears or eyes, rinse them well with water. Avoid your genital area. Avoid any areas of skin that have broken skin, cuts, or scrapes. Scrub your back and under your arms. Make sure to wash skin folds. Let the lather sit on your skin for 1-2 minutes or as long as told by your health care provider. Using a different clean, wet washcloth, thoroughly rinse your entire body. Make sure that all body creases and crevices are  rinsed well. Dry off with a clean towel. Do not put any substances on your body afterward--such as powder, lotion, or perfume--unless you are told to do so by your health care provider. Only use lotions that are recommended by the manufacturer. Put on clean clothes or pajamas. If it is the night before your surgery, sleep in clean sheets. How to use CHG prepackaged cloths Only use CHG cloths as told by your health care provider, and follow the instructions on the label. Use the CHG cloth on clean, dry skin. Do not use the CHG cloth on your head or face unless your health care provider tells you to. When washing with the CHG cloth: Avoid your genital area. Avoid any areas of skin that have broken skin, cuts, or scrapes. Before surgery Follow these steps when using a CHG cloth to clean before surgery (unless your health care provider gives you different instructions): Using the CHG cloth, vigorously scrub the part of your body where you will be having surgery. Scrub using a back-and-forth motion for 3 minutes. The area on your body should be completely wet with CHG when you are done scrubbing. Do not rinse. Discard the cloth and let the area air-dry. Do not put any substances on the area afterward, such as powder, lotion, or perfume. Put on clean clothes or pajamas. If it is the night before your surgery, sleep in clean sheets.  For general bathing Follow these steps when using CHG cloths for general bathing (unless your health care provider gives you different instructions). Use a separate CHG cloth for each area of your body. Make sure you wash between any folds of skin and between your fingers and toes. Wash your body in  the following order, switching to a new cloth after each step: The front of your neck, shoulders, and chest. Both of your arms, under your arms, and your hands. Your stomach and groin area, avoiding the genitals. Your right leg and foot. Your left leg and foot. The back of  your neck, your back, and your buttocks. Do not rinse. Discard the cloth and let the area air-dry. Do not put any substances on your body afterward--such as powder, lotion, or perfume--unless you are told to do so by your health care provider. Only use lotions that are recommended by the manufacturer. Put on clean clothes or pajamas. Contact a health care provider if: Your skin gets irritated after scrubbing. You have questions about using your solution or cloth. You swallow any chlorhexidine. Call your local poison control center (1-671-381-6845 in the [place] Get help right away if: Your eyes itch badly, or they become very red or swollen. Your skin itches badly and is red or swollen. Your hearing changes. You have trouble seeing. You have swelling or tingling in your mouth or throat. You have trouble breathing. These symptoms may represent a serious problem that is an emergency. Do not wait to see if the symptoms will go away. Get medical help right away. Call your local emergency services (911 in the [place] Do not drive yourself to the hospital. Summary Chlorhexidine gluconate (CHG) is a germ-killing (antiseptic) solution that is used to clean the skin. Cleaning your skin with CHG may help to lower your risk for infection. You may be given CHG to use for bathing. It may be in a bottle or in a prepackaged cloth to use on your skin. Carefully follow your health care provider's instructions and the instructions on the product label. Do not use CHG if you have a chlorhexidine allergy. Contact your health care provider if your skin gets irritated after scrubbing. This information is not intended to replace advice given to you by your health care provider. Make sure you discuss any questions you have with your health care provider. Document Revised: 08/03/2021 Document Reviewed: 06/16/2020 Elsevier Patient Education  [place].

## 2022-03-25 ENCOUNTER — Encounter (HOSPITAL_COMMUNITY)
Admission: RE | Admit: 2022-03-25 | Discharge: 2022-03-25 | Disposition: A | Discharge status: Home / Self Care | Payer: Medicare PPO | Source: Ambulatory Visit | Attending: Orthopedic Surgery | Admitting: Orthopedic Surgery

## 2022-03-25 ENCOUNTER — Telehealth: Payer: Self-pay | Admitting: Radiology

## 2022-03-25 VITALS — BP 126/67 | HR 82 | Temp 97.8°F | Resp 18 | Ht 66.0 in | Wt 208.1 lb

## 2022-03-25 DIAGNOSIS — Z01818 Encounter for other preprocedural examination: Secondary | ICD-10-CM

## 2022-03-25 NOTE — Pre-Procedure Instructions (Signed)
Patient in for pre-op. She is complaining of a sore throat, malaise, fever and chills. Dr Charna Elizabeth in to evaluate patient and wants her surgery postponed until she is feeling better. Dr Aline Brochure messaged to let him know.

## 2022-03-25 NOTE — Telephone Encounter (Signed)
I will call her to RS / the soonest we can get her RS is Jan 23rd I called her to advise told her if anything sooner I will let her know

## 2022-03-25 NOTE — Telephone Encounter (Signed)
-----   Message from Encarnacion Chu, RN sent at 03/25/2022  3:11 PM EST ----- Regarding: reschedule Good afternoon! Whitney Moreno just came in for her pre-op. She has a fever, chills, sore throat and malaise. Dr Charna Elizabeth cam and spoke with her and he wants her rescheduled for at least a week from now.

## 2022-03-30 ENCOUNTER — Encounter: Payer: Self-pay | Admitting: Orthopedic Surgery

## 2022-04-09 ENCOUNTER — Telehealth: Payer: Self-pay | Admitting: Orthopedic Surgery

## 2022-04-09 NOTE — Telephone Encounter (Signed)
Patient called, lvm asking if she needs to keep the appt she has for 04/14/22.  Pt's # [phone]

## 2022-04-14 ENCOUNTER — Encounter: Payer: Medicare PPO | Admitting: Orthopedic Surgery

## 2022-04-21 ENCOUNTER — Ambulatory Visit (HOSPITAL_COMMUNITY): Payer: Medicare PPO

## 2022-04-23 ENCOUNTER — Encounter (HOSPITAL_COMMUNITY): Payer: Medicare PPO

## 2022-04-26 ENCOUNTER — Encounter (HOSPITAL_COMMUNITY): Payer: Medicare PPO

## 2022-04-27 ENCOUNTER — Other Ambulatory Visit: Payer: Self-pay | Admitting: Orthopedic Surgery

## 2022-04-27 DIAGNOSIS — M1711 Unilateral primary osteoarthritis, right knee: Secondary | ICD-10-CM

## 2022-04-28 ENCOUNTER — Encounter (HOSPITAL_COMMUNITY): Payer: Medicare PPO

## 2022-04-30 ENCOUNTER — Encounter (HOSPITAL_COMMUNITY): Payer: Medicare PPO

## 2022-04-30 DIAGNOSIS — M25511 Pain in right shoulder: Secondary | ICD-10-CM | POA: Diagnosis not present

## 2022-04-30 DIAGNOSIS — M9903 Segmental and somatic dysfunction of lumbar region: Secondary | ICD-10-CM | POA: Diagnosis not present

## 2022-04-30 DIAGNOSIS — M62830 Muscle spasm of back: Secondary | ICD-10-CM | POA: Diagnosis not present

## 2022-04-30 DIAGNOSIS — M9901 Segmental and somatic dysfunction of cervical region: Secondary | ICD-10-CM | POA: Diagnosis not present

## 2022-04-30 DIAGNOSIS — M542 Cervicalgia: Secondary | ICD-10-CM | POA: Diagnosis not present

## 2022-04-30 DIAGNOSIS — M25561 Pain in right knee: Secondary | ICD-10-CM | POA: Diagnosis not present

## 2022-04-30 DIAGNOSIS — M9902 Segmental and somatic dysfunction of thoracic region: Secondary | ICD-10-CM | POA: Diagnosis not present

## 2022-05-03 ENCOUNTER — Other Ambulatory Visit: Payer: Self-pay | Admitting: Gastroenterology

## 2022-05-03 ENCOUNTER — Encounter (HOSPITAL_COMMUNITY): Payer: Medicare PPO

## 2022-05-03 DIAGNOSIS — K5900 Constipation, unspecified: Secondary | ICD-10-CM

## 2022-05-05 ENCOUNTER — Encounter (HOSPITAL_COMMUNITY): Payer: Medicare PPO

## 2022-05-06 NOTE — Patient Instructions (Signed)
Whitney Moreno  05/06/2022     '@[email or web address]'$ @   Your procedure is scheduled on 05/11/2022.   Report to [facility] at  0600  A.M.   Call this number if you have problems the morning of surgery:  [phone]  If you experience any cold or flu symptoms such as cough, fever, chills, shortness of breath, etc. between now and your scheduled surgery, please notify us at the above number.   Remember:  Do not eat or drink after midnight.      Take these medicines the morning of surgery with A SIP OF WATER         cymbalta, gabapentin, mobic, pantoprazole.     Do not wear jewelry, make-up or nail polish.  Do not wear lotions, powders, or perfumes, or deodorant.  Do not shave 48 hours prior to surgery.  Men may shave face and neck.  Do not bring valuables to the hospital.  [facility] is not responsible for any belongings or valuables.  Contacts, dentures or bridgework may not be worn into surgery.  Leave your suitcase in the car.  After surgery it may be brought to your room.  For patients admitted to the hospital, discharge time will be determined by your treatment team.  Patients discharged the day of surgery will not be allowed to drive home and must have someone with them for 24 hours.    Special instructions:   DO NOT smoke tobacco or vape for 24 hours before your procedure.  Please read over the following fact sheets that you were given. Pain Booklet, Coughing and Deep Breathing, Blood Transfusion Information, Total Joint Packet, MRSA Information, Surgical Site Infection Prevention, Anesthesia Post-op Instructions, and Care and Recovery After Surgery      Total Knee Replacement, Care After This sheet gives you information about how to care for yourself after your procedure. Your health care provider may also give you more specific instructions. If you have problems or questions, contact your health care provider. What can I expect after the  procedure? After the procedure, it is common to have: Redness, pain, and swelling at the incision area. Stiffness. Discomfort. A small amount of blood or clear fluid coming from your incision. Follow these instructions at home: Medicines Take over-the-counter and prescription medicines only as told by your health care provider. If you were prescribed a blood thinner (anticoagulant), take it as told by your health care provider. Ask your health care provider if the medicine prescribed to you: Requires you to avoid driving or using machinery. Can cause constipation. You may need to take these actions to prevent or treat constipation: Drink enough fluid to keep your urine pale yellow. Take over-the-counter or prescription medicines. Eat foods that are high in fiber, such as beans, whole grains, and fresh fruits and vegetables. Limit foods that are high in fat and processed sugars, such as fried or sweet foods. Incision care  Follow instructions from your health care provider about how to take care of your incision. Make sure you: Wash your hands with soap and water for at least 20 seconds before and after you change your bandage (dressing). If soap and water are not available, use hand sanitizer. Change your dressing as told by your health care provider. Leave stitches (sutures), staples, skin glue, or adhesive strips in place. These skin closures may need to stay in place for 2 weeks or longer. If adhesive strip edges start to  loosen and curl up, you may trim the loose edges. Do not remove adhesive strips completely unless your health care provider tells you to do that. Do not take baths, swim, or use a hot tub until your health care provider approves. Check your incision area every day for signs of infection. Check for: More redness, swelling, or pain. More fluid or blood. Warmth. Pus or a bad smell. Activity Rest as told by your health care provider. Avoid sitting for a long time  without moving. Get up to take short walks every 1-2 hours. This is important to improve blood flow and breathing. Ask for help if you feel weak or unsteady. Follow instructions from your health care provider about using a walker, crutches, or a cane. You may use your legs to support (bear) your body weight as told by your health care provider. Follow instructions about how much weight you may safely support on your affected leg (weight-bearing restrictions). A physical therapist may show you how to get out of a bed and chair and how to go up and down stairs. You will first do this with a walker, crutches, or a cane and then without any of these devices. Once you are able to walk without a limp, you may stop using a walker, crutches, or a cane. Do exercises as told by your health care provider or physical therapist. Avoid high-impact activities, including running, jumping rope, and doing jumping jacks. Do not play contact sports until your health care provider approves. Return to your normal activities as told by your health care provider. Ask your health care provider what activities are safe for you. Managing pain, stiffness, and swelling  If directed, put ice on your knee. To do this: Put ice in a plastic bag or use the icing device (cold flow pad) that you were given. Follow instructions from your health care provider about how to use the icing device. Place a towel between your skin and the bag or between your skin and the icing device. Leave the ice on for 20 minutes, 2-3 times a day. Remove the ice if your skin turns bright red. This is very important. If you cannot feel pain, heat, or cold, you have a greater risk of damage to the area. Move your toes often to reduce stiffness and swelling. Raise (elevate) your leg above the level of your heart while you are sitting or lying down. Use several pillows to keep your leg straight. Do not put a pillow just under the knee. If the knee is bent  for a long time, this may lead to stiffness. Wear elastic knee support as told by your health care provider. Safety  To help prevent falls, keep floors clear of objects you may trip over. Place items that you may need within easy reach. Wear an apron or tool belt with pockets for carrying objects. This leaves your hands free to help with your balance. Ask your health care provider when it is safe to drive. General instructions Wear compression stockings as told by your health care provider. These stockings help to prevent blood clots and reduce swelling in your legs. Continue with breathing exercises. This helps prevent lung infection. Do not use any products that contain nicotine or tobacco. These products include cigarettes, chewing tobacco, and vaping devices, such as e-cigarettes. These can delay healing after surgery. If you need help quitting, ask your health care provider. Tell your health care provider if you plan to have dental work. Also: Tell your  dentist about your joint replacement. Ask your health care provider if there are any special instructions you need to follow before having dental care and routine cleanings. Keep all follow-up visits. This is important. Contact a health care provider if: You have a fever or chills. You have a cough or feel short of breath. Your medicine is not controlling your pain. You have any of these signs of infection: More redness, swelling, or pain around your incision. More fluid or blood coming from your incision. Warmth coming from your incision. Pus or a bad smell coming from your incision. You fall. Get help right away if: You have severe pain. You have trouble breathing. You have chest pain. You have redness, swelling, pain, or warmth in your calf or leg. Your incision breaks open after sutures or staples are removed. These symptoms may represent a serious problem that is an emergency. Do not wait to see if the symptoms will go away.  Get medical help right away. Call your local emergency services (911 in the [place] Do not drive yourself to the hospital. Summary After the procedure, it is common to have pain and swelling at the incision area, a small amount of blood or fluid coming from your incision, and stiffness. Follow instructions from your health care provider about how to take care of your incision. Use crutches, a walker, or a cane as told by your health care provider. This information is not intended to replace advice given to you by your health care provider. Make sure you discuss any questions you have with your health care provider. Document Revised: 09/25/2019 Document Reviewed: 09/25/2019 Elsevier Patient Education  [place] Anesthesia, Adult, Care After The following information offers guidance on how to care for yourself after your procedure. Your health care provider may also give you more specific instructions. If you have problems or questions, contact your health care provider. What can I expect after the procedure? After the procedure, it is common for people to: Have pain or discomfort at the IV site. Have nausea or vomiting. Have a sore throat or hoarseness. Have trouble concentrating. Feel cold or chills. Feel weak, sleepy, or tired (fatigue). Have soreness and body aches. These can affect parts of the body that were not involved in surgery. Follow these instructions at home: For the time period you were told by your health care provider:  Rest. Do not participate in activities where you could fall or become injured. Do not drive or use machinery. Do not drink alcohol. Do not take sleeping pills or medicines that cause drowsiness. Do not make important decisions or sign legal documents. Do not take care of children on your own. General instructions Drink enough fluid to keep your urine pale yellow. If you have sleep apnea, surgery and certain medicines can increase your  risk for breathing problems. Follow instructions from your health care provider about wearing your sleep device: Anytime you are sleeping, including during daytime naps. While taking prescription pain medicines, sleeping medicines, or medicines that make you drowsy. Return to your normal activities as told by your health care provider. Ask your health care provider what activities are safe for you. Take over-the-counter and prescription medicines only as told by your health care provider. Do not use any products that contain nicotine or tobacco. These products include cigarettes, chewing tobacco, and vaping devices, such as e-cigarettes. These can delay incision healing after surgery. If you need help quitting, ask your health care provider. Contact a health care  provider if: You have nausea or vomiting that does not get better with medicine. You vomit every time you eat or drink. You have pain that does not get better with medicine. You cannot urinate or have bloody urine. You develop a skin rash. You have a fever. Get help right away if: You have trouble breathing. You have chest pain. You vomit blood. These symptoms may be an emergency. Get help right away. Call 911. Do not wait to see if the symptoms will go away. Do not drive yourself to the hospital. Summary After the procedure, it is common to have a sore throat, hoarseness, nausea, vomiting, or to feel weak, sleepy, or fatigue. For the time period you were told by your health care provider, do not drive or use machinery. Get help right away if you have difficulty breathing, have chest pain, or vomit blood. These symptoms may be an emergency. This information is not intended to replace advice given to you by your health care provider. Make sure you discuss any questions you have with your health care provider. Document Revised: 07/03/2021 Document Reviewed: 07/03/2021 Elsevier Patient Education  [place]. Spinal Anesthesia  and Epidural Anesthesia, Care After This sheet gives you information about how to care for yourself after your procedure. Your doctor may also give you more specific instructions. If you have problems or questions, contact your doctor. What can I expect after the procedure? While the medicines you were given are still having effects, it is common to: Feel like you may vomit (nauseous). Vomit. Have a numb feeling or tingling in your legs. Have problems when you pee (urinate). Feel itchy. Follow these instructions at home: For the time period you were told by your doctor:  Rest. Do not do activities where you could fall or get hurt. Do not drive or use machines. Do not drink alcohol. Do not take sleeping pills or medicines that make you drowsy. Do not make big decisions or sign legal documents. Do not take care of children on your own. Eating and drinking If you vomit, wait a short time. When you can drink without vomiting, try to drink some water, juice, or clear soup. Drink enough fluid to keep your pee (urine) pale yellow. Make sure you do not feel like vomiting before you eat solid foods. Follow the diet that your doctor recommends. General instructions If you have sleep apnea, surgery and certain medicines can raise your risk for breathing problems. Follow instructions from your doctor about when to wear your sleep device. Have a responsible adult stay with you for the time you are told. It is important to have someone help care for you until you are awake and alert. Return to your normal activities as told by your doctor. Ask your doctor what activities are safe for you. Take over-the-counter and prescription medicines only as told by your doctor. Do not use any products that contain nicotine or tobacco, such as cigarettes, e-cigarettes, and chewing tobacco. If you need help quitting, ask your doctor. Keep all follow-up visits as told by your doctor. This is important. Contact a  doctor if: It has been more than one day since your procedure and you feel like you may vomit or you are vomiting. You have a rash. Get help right away if: You have a fever. You have a headache that lasts a long time. You have a very bad headache. Your vision is blurry or you see two of a single object (double vision). You are  dizzy or light-headed. You faint. Your arms or legs tingle, feel weak, or get numb. You have trouble breathing. You cannot pee. These symptoms may be an emergency. Get help right away. Call your local emergency services (911 in the [place] Do not wait to see if the symptoms will go away. Do not drive yourself to the hospital. Summary After the procedure, have a responsible adult stay with you at home. Do not do activities that might cause you to get hurt. Do not drive, use machinery, drink alcohol, or make important decisions as told by your doctor. Do not use products that contain nicotine or tobacco. Get help right away if you have a very bad headache, trouble breathing, or you cannot pee. This information is not intended to replace advice given to you by your health care provider. Make sure you discuss any questions you have with your health care provider. Document Revised: 12/20/2019 Document Reviewed: 05/24/2019 Elsevier Patient Education  [place]. How to Use Chlorhexidine Before Surgery Chlorhexidine gluconate (CHG) is a germ-killing (antiseptic) solution that is used to clean the skin. It can get rid of the bacteria that normally live on the skin and can keep them away for about 24 hours. To clean your skin with CHG, you may be given: A CHG solution to use in the shower or as part of a sponge bath. A prepackaged cloth that contains CHG. Cleaning your skin with CHG may help lower the risk for infection: While you are staying in the intensive care unit of the hospital. If you have a vascular access, such as a central line, to provide short-term or  long-term access to your veins. If you have a catheter to drain urine from your bladder. If you are on a ventilator. A ventilator is a machine that helps you breathe by moving air in and out of your lungs. After surgery. What are the risks? Risks of using CHG include: A skin reaction. Hearing loss, if CHG gets in your ears and you have a perforated eardrum. Eye injury, if CHG gets in your eyes and is not rinsed out. The CHG product catching fire. Make sure that you avoid smoking and flames after applying CHG to your skin. Do not use CHG: If you have a chlorhexidine allergy or have previously reacted to chlorhexidine. On babies younger than 41 months of age. How to use CHG solution Use CHG only as told by your health care provider, and follow the instructions on the label. Use the full amount of CHG as directed. Usually, this is one bottle. During a shower Follow these steps when using CHG solution during a shower (unless your health care provider gives you different instructions): Start the shower. Use your normal soap and shampoo to wash your face and hair. Turn off the shower or move out of the shower stream. Pour the CHG onto a clean washcloth. Do not use any type of brush or rough-edged sponge. Starting at your neck, lather your body down to your toes. Make sure you follow these instructions: If you will be having surgery, pay special attention to the part of your body where you will be having surgery. Scrub this area for at least 1 minute. Do not use CHG on your head or face. If the solution gets into your ears or eyes, rinse them well with water. Avoid your genital area. Avoid any areas of skin that have broken skin, cuts, or scrapes. Scrub your back and under your arms. Make sure to  wash skin folds. Let the lather sit on your skin for 1-2 minutes or as long as told by your health care provider. Thoroughly rinse your entire body in the shower. Make sure that all body creases and  crevices are rinsed well. Dry off with a clean towel. Do not put any substances on your body afterward--such as powder, lotion, or perfume--unless you are told to do so by your health care provider. Only use lotions that are recommended by the manufacturer. Put on clean clothes or pajamas. If it is the night before your surgery, sleep in clean sheets.  During a sponge bath Follow these steps when using CHG solution during a sponge bath (unless your health care provider gives you different instructions): Use your normal soap and shampoo to wash your face and hair. Pour the CHG onto a clean washcloth. Starting at your neck, lather your body down to your toes. Make sure you follow these instructions: If you will be having surgery, pay special attention to the part of your body where you will be having surgery. Scrub this area for at least 1 minute. Do not use CHG on your head or face. If the solution gets into your ears or eyes, rinse them well with water. Avoid your genital area. Avoid any areas of skin that have broken skin, cuts, or scrapes. Scrub your back and under your arms. Make sure to wash skin folds. Let the lather sit on your skin for 1-2 minutes or as long as told by your health care provider. Using a different clean, wet washcloth, thoroughly rinse your entire body. Make sure that all body creases and crevices are rinsed well. Dry off with a clean towel. Do not put any substances on your body afterward--such as powder, lotion, or perfume--unless you are told to do so by your health care provider. Only use lotions that are recommended by the manufacturer. Put on clean clothes or pajamas. If it is the night before your surgery, sleep in clean sheets. How to use CHG prepackaged cloths Only use CHG cloths as told by your health care provider, and follow the instructions on the label. Use the CHG cloth on clean, dry skin. Do not use the CHG cloth on your head or face unless your health  care provider tells you to. When washing with the CHG cloth: Avoid your genital area. Avoid any areas of skin that have broken skin, cuts, or scrapes. Before surgery Follow these steps when using a CHG cloth to clean before surgery (unless your health care provider gives you different instructions): Using the CHG cloth, vigorously scrub the part of your body where you will be having surgery. Scrub using a back-and-forth motion for 3 minutes. The area on your body should be completely wet with CHG when you are done scrubbing. Do not rinse. Discard the cloth and let the area air-dry. Do not put any substances on the area afterward, such as powder, lotion, or perfume. Put on clean clothes or pajamas. If it is the night before your surgery, sleep in clean sheets.  For general bathing Follow these steps when using CHG cloths for general bathing (unless your health care provider gives you different instructions). Use a separate CHG cloth for each area of your body. Make sure you wash between any folds of skin and between your fingers and toes. Wash your body in the following order, switching to a new cloth after each step: The front of your neck, shoulders, and chest. Both of your  arms, under your arms, and your hands. Your stomach and groin area, avoiding the genitals. Your right leg and foot. Your left leg and foot. The back of your neck, your back, and your buttocks. Do not rinse. Discard the cloth and let the area air-dry. Do not put any substances on your body afterward--such as powder, lotion, or perfume--unless you are told to do so by your health care provider. Only use lotions that are recommended by the manufacturer. Put on clean clothes or pajamas. Contact a health care provider if: Your skin gets irritated after scrubbing. You have questions about using your solution or cloth. You swallow any chlorhexidine. Call your local poison control center (1-(905)084-2794 in the [place] Get help  right away if: Your eyes itch badly, or they become very red or swollen. Your skin itches badly and is red or swollen. Your hearing changes. You have trouble seeing. You have swelling or tingling in your mouth or throat. You have trouble breathing. These symptoms may represent a serious problem that is an emergency. Do not wait to see if the symptoms will go away. Get medical help right away. Call your local emergency services (911 in the [place] Do not drive yourself to the hospital. Summary Chlorhexidine gluconate (CHG) is a germ-killing (antiseptic) solution that is used to clean the skin. Cleaning your skin with CHG may help to lower your risk for infection. You may be given CHG to use for bathing. It may be in a bottle or in a prepackaged cloth to use on your skin. Carefully follow your health care provider's instructions and the instructions on the product label. Do not use CHG if you have a chlorhexidine allergy. Contact your health care provider if your skin gets irritated after scrubbing. This information is not intended to replace advice given to you by your health care provider. Make sure you discuss any questions you have with your health care provider. Document Revised: 08/03/2021 Document Reviewed: 06/16/2020 Elsevier Patient Education  [place].

## 2022-05-07 ENCOUNTER — Encounter (HOSPITAL_COMMUNITY): Payer: Self-pay

## 2022-05-07 ENCOUNTER — Encounter (HOSPITAL_COMMUNITY)
Admission: RE | Admit: 2022-05-07 | Discharge: 2022-05-07 | Disposition: A | Discharge status: Home / Self Care | Payer: Medicare PPO | Source: Ambulatory Visit | Attending: Orthopedic Surgery | Admitting: Orthopedic Surgery

## 2022-05-07 ENCOUNTER — Encounter (HOSPITAL_COMMUNITY): Payer: Medicare PPO

## 2022-05-07 DIAGNOSIS — Z79899 Other long term (current) drug therapy: Secondary | ICD-10-CM | POA: Diagnosis not present

## 2022-05-07 DIAGNOSIS — Z01812 Encounter for preprocedural laboratory examination: Secondary | ICD-10-CM | POA: Diagnosis not present

## 2022-05-07 DIAGNOSIS — M1711 Unilateral primary osteoarthritis, right knee: Secondary | ICD-10-CM

## 2022-05-07 DIAGNOSIS — Z01818 Encounter for other preprocedural examination: Secondary | ICD-10-CM

## 2022-05-07 HISTORY — DX: Other specified postprocedural states: Z98.890

## 2022-05-07 HISTORY — DX: Nausea with vomiting, unspecified: R11.2

## 2022-05-07 LAB — PREPARE RBC (CROSSMATCH)

## 2022-05-07 LAB — CBC WITH DIFFERENTIAL/PLATELET
Abs Immature Granulocytes: 0.02 10*3/uL (ref 0.00–0.07)
Basophils Absolute: 0.1 10*3/uL (ref 0.0–0.1)
Basophils Relative: 1 %
Eosinophils Absolute: 0.2 10*3/uL (ref 0.0–0.5)
Eosinophils Relative: 3 %
HCT: 42.5 % (ref 36.0–46.0)
Hemoglobin: 13.8 g/dL (ref 12.0–15.0)
Immature Granulocytes: 0 %
Lymphocytes Relative: 30 %
Lymphs Abs: 2.4 10*3/uL (ref 0.7–4.0)
MCH: 30.4 pg (ref 26.0–34.0)
MCHC: 32.5 g/dL (ref 30.0–36.0)
MCV: 93.6 fL (ref 80.0–100.0)
Monocytes Absolute: 0.6 10*3/uL (ref 0.1–1.0)
Monocytes Relative: 8 %
Neutro Abs: 4.7 10*3/uL (ref 1.7–7.7)
Neutrophils Relative %: 58 %
Platelets: 306 10*3/uL (ref 150–400)
RBC: 4.54 MIL/uL (ref 3.87–5.11)
RDW: 14 % (ref 11.5–15.5)
WBC: 8.1 10*3/uL (ref 4.0–10.5)
nRBC: 0 % (ref 0.0–0.2)

## 2022-05-07 LAB — BASIC METABOLIC PANEL
Anion gap: 9 (ref 5–15)
BUN: 19 mg/dL (ref 8–23)
CO2: 26 mmol/L (ref 22–32)
Calcium: 9.9 mg/dL (ref 8.9–10.3)
Chloride: 102 mmol/L (ref 98–111)
Creatinine, Ser: 0.87 mg/dL (ref 0.44–1.00)
GFR, Estimated: >60 mL/min (ref >60)
Glucose, Bld: 117 mg/dL — ABNORMAL HIGH (ref 70–99)
Potassium: 4.1 mmol/L (ref 3.5–5.1)
Sodium: 137 mmol/L (ref 135–145)

## 2022-05-07 LAB — SURGICAL PCR SCREEN
MRSA, PCR: NEGATIVE
Staphylococcus aureus: NEGATIVE
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
## 2022-05-10 ENCOUNTER — Encounter (HOSPITAL_COMMUNITY): Payer: Medicare PPO

## 2022-05-11 ENCOUNTER — Other Ambulatory Visit: Payer: Self-pay

## 2022-05-11 ENCOUNTER — Encounter (HOSPITAL_COMMUNITY): Payer: Self-pay | Admitting: Orthopedic Surgery

## 2022-05-11 ENCOUNTER — Ambulatory Visit (HOSPITAL_COMMUNITY): Payer: Medicare PPO | Admitting: Anesthesiology

## 2022-05-11 ENCOUNTER — Encounter (HOSPITAL_COMMUNITY)
Admission: RE | Disposition: A | Discharge status: Skilled Nursing Facility | Payer: Self-pay | Source: Home / Self Care | Attending: Internal Medicine

## 2022-05-11 ENCOUNTER — Observation Stay (HOSPITAL_COMMUNITY): Payer: Medicare PPO

## 2022-05-11 ENCOUNTER — Inpatient Hospital Stay (HOSPITAL_COMMUNITY)
Admission: RE | Admit: 2022-05-11 | Discharge: 2022-05-16 | DRG: 469 | Disposition: A | Discharge status: Skilled Nursing Facility | Payer: Medicare PPO | Attending: Internal Medicine | Admitting: Internal Medicine

## 2022-05-11 DIAGNOSIS — I82451 Acute embolism and thrombosis of right peroneal vein: Secondary | ICD-10-CM | POA: Diagnosis not present

## 2022-05-11 DIAGNOSIS — M179 Osteoarthritis of knee, unspecified: Principal | ICD-10-CM

## 2022-05-11 DIAGNOSIS — M797 Fibromyalgia: Secondary | ICD-10-CM | POA: Diagnosis present

## 2022-05-11 DIAGNOSIS — Z6835 Body mass index (BMI) 35.0-35.9, adult: Secondary | ICD-10-CM

## 2022-05-11 DIAGNOSIS — M1711 Unilateral primary osteoarthritis, right knee: Principal | ICD-10-CM | POA: Diagnosis present

## 2022-05-11 DIAGNOSIS — E669 Obesity, unspecified: Secondary | ICD-10-CM | POA: Diagnosis present

## 2022-05-11 DIAGNOSIS — I2699 Other pulmonary embolism without acute cor pulmonale: Secondary | ICD-10-CM

## 2022-05-11 DIAGNOSIS — I468 Cardiac arrest due to other underlying condition: Secondary | ICD-10-CM | POA: Diagnosis not present

## 2022-05-11 DIAGNOSIS — Z1152 Encounter for screening for COVID-19: Secondary | ICD-10-CM

## 2022-05-11 DIAGNOSIS — D62 Acute posthemorrhagic anemia: Secondary | ICD-10-CM | POA: Diagnosis not present

## 2022-05-11 DIAGNOSIS — Z803 Family history of malignant neoplasm of breast: Secondary | ICD-10-CM

## 2022-05-11 DIAGNOSIS — Z96651 Presence of right artificial knee joint: Secondary | ICD-10-CM | POA: Diagnosis not present

## 2022-05-11 DIAGNOSIS — I824Z1 Acute embolism and thrombosis of unspecified deep veins of right distal lower extremity: Secondary | ICD-10-CM | POA: Diagnosis not present

## 2022-05-11 DIAGNOSIS — Z87891 Personal history of nicotine dependence: Secondary | ICD-10-CM

## 2022-05-11 DIAGNOSIS — J9811 Atelectasis: Secondary | ICD-10-CM | POA: Diagnosis not present

## 2022-05-11 DIAGNOSIS — I2609 Other pulmonary embolism with acute cor pulmonale: Secondary | ICD-10-CM | POA: Diagnosis not present

## 2022-05-11 DIAGNOSIS — G8929 Other chronic pain: Secondary | ICD-10-CM | POA: Diagnosis present

## 2022-05-11 DIAGNOSIS — Z96652 Presence of left artificial knee joint: Secondary | ICD-10-CM | POA: Diagnosis present

## 2022-05-11 DIAGNOSIS — G629 Polyneuropathy, unspecified: Secondary | ICD-10-CM | POA: Diagnosis present

## 2022-05-11 DIAGNOSIS — D649 Anemia, unspecified: Secondary | ICD-10-CM | POA: Diagnosis present

## 2022-05-11 DIAGNOSIS — Z8249 Family history of ischemic heart disease and other diseases of the circulatory system: Secondary | ICD-10-CM

## 2022-05-11 DIAGNOSIS — M069 Rheumatoid arthritis, unspecified: Secondary | ICD-10-CM | POA: Diagnosis present

## 2022-05-11 DIAGNOSIS — I469 Cardiac arrest, cause unspecified: Secondary | ICD-10-CM | POA: Diagnosis not present

## 2022-05-11 DIAGNOSIS — Z471 Aftercare following joint replacement surgery: Secondary | ICD-10-CM | POA: Diagnosis not present

## 2022-05-11 DIAGNOSIS — Z01818 Encounter for other preprocedural examination: Secondary | ICD-10-CM

## 2022-05-11 DIAGNOSIS — I4510 Unspecified right bundle-branch block: Secondary | ICD-10-CM | POA: Diagnosis present

## 2022-05-11 DIAGNOSIS — R262 Difficulty in walking, not elsewhere classified: Secondary | ICD-10-CM | POA: Diagnosis not present

## 2022-05-11 DIAGNOSIS — G8918 Other acute postprocedural pain: Secondary | ICD-10-CM | POA: Diagnosis not present

## 2022-05-11 DIAGNOSIS — I82441 Acute embolism and thrombosis of right tibial vein: Secondary | ICD-10-CM | POA: Diagnosis not present

## 2022-05-11 DIAGNOSIS — K219 Gastro-esophageal reflux disease without esophagitis: Secondary | ICD-10-CM | POA: Diagnosis present

## 2022-05-11 DIAGNOSIS — I2692 Saddle embolus of pulmonary artery without acute cor pulmonale: Secondary | ICD-10-CM | POA: Diagnosis not present

## 2022-05-11 DIAGNOSIS — Z4682 Encounter for fitting and adjustment of non-vascular catheter: Secondary | ICD-10-CM | POA: Diagnosis not present

## 2022-05-11 DIAGNOSIS — M6281 Muscle weakness (generalized): Secondary | ICD-10-CM | POA: Diagnosis not present

## 2022-05-11 DIAGNOSIS — I771 Stricture of artery: Secondary | ICD-10-CM | POA: Diagnosis not present

## 2022-05-11 DIAGNOSIS — I2694 Multiple subsegmental pulmonary emboli without acute cor pulmonale: Secondary | ICD-10-CM | POA: Diagnosis not present

## 2022-05-11 HISTORY — PX: TOTAL KNEE ARTHROPLASTY: SHX125

## 2022-05-11 LAB — ABO/RH: ABO/RH(D): O POS

## 2022-05-11 SURGERY — ARTHROPLASTY, KNEE, TOTAL
Anesthesia: General | Site: Knee | Laterality: Right

## 2022-05-11 MED ORDER — HYDROMORPHONE HCL 1 MG/ML IJ SOLN
INTRAMUSCULAR | Status: AC
  Filled 2022-05-11: qty 1

## 2022-05-11 MED ORDER — MORPHINE SULFATE (PF) 2 MG/ML IV SOLN
0.5000 mg | INTRAVENOUS | Status: DC | PRN
  Administered 2022-05-15 – 2022-05-16 (×2): 1 mg via INTRAVENOUS
  Filled 2022-05-11 (×3): qty 1

## 2022-05-11 MED ORDER — LACTULOSE 10 GM/15ML PO SOLN
10.0000 g | Freq: Every day | ORAL | Status: DC
  Administered 2022-05-11 – 2022-05-12 (×2): 10 g via ORAL
  Filled 2022-05-11 (×2): qty 30

## 2022-05-11 MED ORDER — SUGAMMADEX SODIUM 200 MG/2ML IV SOLN
INTRAVENOUS | Status: DC | PRN
  Administered 2022-05-11: 200 mg via INTRAVENOUS

## 2022-05-11 MED ORDER — LACTATED RINGERS IV SOLN: INTRAVENOUS | Status: DC

## 2022-05-11 MED ORDER — STERILE WATER FOR IRRIGATION IR SOLN
Status: DC | PRN
  Administered 2022-05-11: 2000 mL

## 2022-05-11 MED ORDER — HYDROMORPHONE HCL 1 MG/ML IJ SOLN
0.2500 mg | INTRAMUSCULAR | Status: DC | PRN
  Administered 2022-05-11 (×3): 0.5 mg via INTRAVENOUS
  Filled 2022-05-11 (×3): qty 0.5

## 2022-05-11 MED ORDER — OXYCODONE HCL 5 MG PO TABS
5.0000 mg | ORAL_TABLET | Freq: Once | ORAL | Status: AC
  Administered 2022-05-11: 5 mg via ORAL
  Filled 2022-05-11: qty 1

## 2022-05-11 MED ORDER — ROPIVACAINE HCL 5 MG/ML IJ SOLN
INTRAMUSCULAR | Status: AC
  Filled 2022-05-11: qty 30

## 2022-05-11 MED ORDER — TRANEXAMIC ACID-NACL 1000-0.7 MG/100ML-% IV SOLN
INTRAVENOUS | Status: DC | PRN
  Administered 2022-05-11: 1000 mg via INTRAVENOUS

## 2022-05-11 MED ORDER — MENTHOL 3 MG MT LOZG: 1.0000 | LOZENGE | OROMUCOSAL | Status: DC | PRN

## 2022-05-11 MED ORDER — DEXAMETHASONE SODIUM PHOSPHATE 4 MG/ML IJ SOLN
INTRAMUSCULAR | Status: DC | PRN
  Administered 2022-05-11: 8 mg via PERINEURAL

## 2022-05-11 MED ORDER — DEXMEDETOMIDINE HCL IN NACL 80 MCG/20ML IV SOLN
INTRAVENOUS | Status: AC
  Filled 2022-05-11: qty 20

## 2022-05-11 MED ORDER — BUPIVACAINE-MELOXICAM ER 200-6 MG/7ML IJ SOLN
INTRAMUSCULAR | Status: AC
  Filled 2022-05-11: qty 2

## 2022-05-11 MED ORDER — PANTOPRAZOLE SODIUM 40 MG PO TBEC
40.0000 mg | DELAYED_RELEASE_TABLET | Freq: Every day | ORAL | Status: DC
  Administered 2022-05-12: 40 mg via ORAL
  Filled 2022-05-11: qty 1

## 2022-05-11 MED ORDER — METOCLOPRAMIDE HCL 5 MG/ML IJ SOLN: 5.0000 mg | Freq: Three times a day (TID) | INTRAMUSCULAR | Status: DC | PRN

## 2022-05-11 MED ORDER — BISACODYL 5 MG PO TBEC: 5.0000 mg | DELAYED_RELEASE_TABLET | Freq: Every day | ORAL | Status: DC | PRN

## 2022-05-11 MED ORDER — LIDOCAINE HCL (PF) 2 % IJ SOLN
INTRAMUSCULAR | Status: AC
  Filled 2022-05-11: qty 5

## 2022-05-11 MED ORDER — PROPOFOL 10 MG/ML IV BOLUS
INTRAVENOUS | Status: AC
  Filled 2022-05-11: qty 20

## 2022-05-11 MED ORDER — METOCLOPRAMIDE HCL 5 MG PO TABS: 5.0000 mg | ORAL_TABLET | Freq: Three times a day (TID) | ORAL | Status: DC | PRN

## 2022-05-11 MED ORDER — BUPIVACAINE-MELOXICAM ER 200-6 MG/7ML IJ SOLN
INTRAMUSCULAR | Status: DC | PRN
  Administered 2022-05-11: 400 mg

## 2022-05-11 MED ORDER — PROPOFOL 500 MG/50ML IV EMUL
INTRAVENOUS | Status: AC
  Filled 2022-05-11: qty 50

## 2022-05-11 MED ORDER — ESMOLOL HCL 100 MG/10ML IV SOLN
INTRAVENOUS | Status: AC
  Filled 2022-05-11: qty 10

## 2022-05-11 MED ORDER — ONDANSETRON HCL 4 MG PO TABS: 4.0000 mg | ORAL_TABLET | Freq: Four times a day (QID) | ORAL | Status: DC | PRN

## 2022-05-11 MED ORDER — LIDOCAINE HCL (PF) 1 % IJ SOLN
INTRAMUSCULAR | Status: AC
  Filled 2022-05-11: qty 30

## 2022-05-11 MED ORDER — PROPOFOL 500 MG/50ML IV EMUL
INTRAVENOUS | Status: DC | PRN
  Administered 2022-05-11: 25 ug/kg/min via INTRAVENOUS

## 2022-05-11 MED ORDER — SODIUM CHLORIDE 0.9 % IV SOLN: INTRAVENOUS | Status: DC

## 2022-05-11 MED ORDER — PHENYLEPHRINE 80 MCG/ML (10ML) SYRINGE FOR IV PUSH (FOR BLOOD PRESSURE SUPPORT)
PREFILLED_SYRINGE | INTRAVENOUS | Status: AC
  Filled 2022-05-11: qty 10

## 2022-05-11 MED ORDER — LIDOCAINE HCL (PF) 1 % IJ SOLN
INTRAMUSCULAR | Status: DC | PRN
  Administered 2022-05-11: 4 mL

## 2022-05-11 MED ORDER — HYDROCODONE-ACETAMINOPHEN 5-325 MG PO TABS: 1.0000 | ORAL_TABLET | ORAL | Status: DC | PRN

## 2022-05-11 MED ORDER — DIPHENHYDRAMINE HCL 12.5 MG/5ML PO ELIX: 12.5000 mg | ORAL_SOLUTION | ORAL | Status: DC | PRN

## 2022-05-11 MED ORDER — ROCURONIUM BROMIDE 100 MG/10ML IV SOLN
INTRAVENOUS | Status: DC | PRN
  Administered 2022-05-11: 70 mg via INTRAVENOUS
  Administered 2022-05-11: 10 mg via INTRAVENOUS

## 2022-05-11 MED ORDER — HYDROCODONE-ACETAMINOPHEN 7.5-325 MG PO TABS: 1.0000 | ORAL_TABLET | ORAL | Status: DC | PRN

## 2022-05-11 MED ORDER — ONDANSETRON HCL 4 MG/2ML IJ SOLN
INTRAMUSCULAR | Status: DC | PRN
  Administered 2022-05-11: 4 mg via INTRAVENOUS

## 2022-05-11 MED ORDER — FOLIC ACID 1 MG PO TABS
1.0000 mg | ORAL_TABLET | Freq: Every day | ORAL | Status: DC
  Administered 2022-05-11 – 2022-05-12 (×2): 1 mg via ORAL
  Filled 2022-05-11 (×2): qty 1

## 2022-05-11 MED ORDER — FENTANYL CITRATE (PF) 250 MCG/5ML IJ SOLN
INTRAMUSCULAR | Status: AC
  Filled 2022-05-11: qty 5

## 2022-05-11 MED ORDER — ACETAMINOPHEN 10 MG/ML IV SOLN
INTRAVENOUS | Status: AC
  Filled 2022-05-11: qty 100

## 2022-05-11 MED ORDER — CEFAZOLIN SODIUM-DEXTROSE 2-4 GM/100ML-% IV SOLN
INTRAVENOUS | Status: AC
  Administered 2022-05-11: 2 g via INTRAVENOUS
  Filled 2022-05-11: qty 100

## 2022-05-11 MED ORDER — SCOPOLAMINE 1 MG/3DAYS TD PT72: 1.0000 | MEDICATED_PATCH | Freq: Once | TRANSDERMAL | Status: DC

## 2022-05-11 MED ORDER — POLYVINYL ALCOHOL 1.4 % OP SOLN: 1.0000 [drp] | OPHTHALMIC | Status: DC | PRN

## 2022-05-11 MED ORDER — ONDANSETRON HCL 4 MG/2ML IJ SOLN: 4.0000 mg | Freq: Once | INTRAMUSCULAR | Status: DC | PRN

## 2022-05-11 MED ORDER — ROPIVACAINE HCL 5 MG/ML IJ SOLN
INTRAMUSCULAR | Status: DC | PRN
  Administered 2022-05-11: 28 mL via PERINEURAL

## 2022-05-11 MED ORDER — PREGABALIN 50 MG PO CAPS
50.0000 mg | ORAL_CAPSULE | Freq: Once | ORAL | Status: AC
  Administered 2022-05-11: 50 mg via ORAL
  Filled 2022-05-11: qty 1

## 2022-05-11 MED ORDER — ONDANSETRON HCL 4 MG/2ML IJ SOLN: 4.0000 mg | Freq: Four times a day (QID) | INTRAMUSCULAR | Status: DC | PRN

## 2022-05-11 MED ORDER — KETAMINE HCL 50 MG/5ML IJ SOSY
PREFILLED_SYRINGE | INTRAMUSCULAR | Status: AC
  Filled 2022-05-11: qty 5

## 2022-05-11 MED ORDER — ACETAMINOPHEN 500 MG PO TABS
500.0000 mg | ORAL_TABLET | Freq: Four times a day (QID) | ORAL | Status: DC
  Administered 2022-05-11 – 2022-05-12 (×3): 500 mg via ORAL
  Filled 2022-05-11 (×3): qty 1

## 2022-05-11 MED ORDER — SCOPOLAMINE 1 MG/3DAYS TD PT72
MEDICATED_PATCH | TRANSDERMAL | Status: AC
  Administered 2022-05-11: 1.5 mg via TRANSDERMAL
  Filled 2022-05-11: qty 1

## 2022-05-11 MED ORDER — DULOXETINE HCL 60 MG PO CPEP
60.0000 mg | ORAL_CAPSULE | Freq: Every day | ORAL | Status: DC
  Administered 2022-05-12: 60 mg via ORAL
  Filled 2022-05-11: qty 1

## 2022-05-11 MED ORDER — DEXAMETHASONE SODIUM PHOSPHATE 10 MG/ML IJ SOLN
INTRAMUSCULAR | Status: DC | PRN
  Administered 2022-05-11: 10 mg via INTRAVENOUS

## 2022-05-11 MED ORDER — TRAMADOL HCL 50 MG PO TABS
50.0000 mg | ORAL_TABLET | Freq: Four times a day (QID) | ORAL | Status: DC
  Administered 2022-05-11 – 2022-05-12 (×4): 50 mg via ORAL
  Filled 2022-05-11 (×4): qty 1

## 2022-05-11 MED ORDER — TRANEXAMIC ACID-NACL 1000-0.7 MG/100ML-% IV SOLN
1000.0000 mg | Freq: Once | INTRAVENOUS | Status: AC
  Administered 2022-05-11: 1000 mg via INTRAVENOUS
  Filled 2022-05-11: qty 100

## 2022-05-11 MED ORDER — SCOPOLAMINE 1 MG/3DAYS TD PT72
MEDICATED_PATCH | TRANSDERMAL | Status: AC
  Filled 2022-05-11: qty 1

## 2022-05-11 MED ORDER — ACETAMINOPHEN 10 MG/ML IV SOLN
INTRAVENOUS | Status: DC | PRN
  Administered 2022-05-11: 1000 mg via INTRAVENOUS

## 2022-05-11 MED ORDER — 0.9 % SODIUM CHLORIDE (POUR BTL) OPTIME
TOPICAL | Status: DC | PRN
  Administered 2022-05-11: 1000 mL

## 2022-05-11 MED ORDER — POVIDONE-IODINE 10 % EX SWAB
2.0000 | Freq: Once | CUTANEOUS | Status: AC
  Administered 2022-05-11: 2 via TOPICAL

## 2022-05-11 MED ORDER — HYDROMORPHONE HCL 1 MG/ML IJ SOLN
INTRAMUSCULAR | Status: DC | PRN
  Administered 2022-05-11 (×2): .5 mg via INTRAVENOUS

## 2022-05-11 MED ORDER — ASPIRIN 325 MG PO TBEC
325.0000 mg | DELAYED_RELEASE_TABLET | Freq: Every day | ORAL | Status: DC
  Administered 2022-05-12 – 2022-05-15 (×3): 325 mg via ORAL
  Filled 2022-05-11 (×3): qty 1

## 2022-05-11 MED ORDER — TRANEXAMIC ACID-NACL 1000-0.7 MG/100ML-% IV SOLN
INTRAVENOUS | Status: AC
  Filled 2022-05-11: qty 100

## 2022-05-11 MED ORDER — DOCUSATE SODIUM 100 MG PO CAPS
100.0000 mg | ORAL_CAPSULE | Freq: Two times a day (BID) | ORAL | Status: DC
  Administered 2022-05-11 – 2022-05-12 (×2): 100 mg via ORAL
  Filled 2022-05-11 (×2): qty 1

## 2022-05-11 MED ORDER — ROCURONIUM BROMIDE 10 MG/ML (PF) SYRINGE
PREFILLED_SYRINGE | INTRAVENOUS | Status: AC
  Filled 2022-05-11: qty 10

## 2022-05-11 MED ORDER — METHOCARBAMOL 1000 MG/10ML IJ SOLN: 500.0000 mg | Freq: Four times a day (QID) | INTRAVENOUS | Status: DC | PRN

## 2022-05-11 MED ORDER — CEFAZOLIN SODIUM-DEXTROSE 2-4 GM/100ML-% IV SOLN
2.0000 g | Freq: Four times a day (QID) | INTRAVENOUS | Status: AC
  Administered 2022-05-11: 2 g via INTRAVENOUS
  Filled 2022-05-11 (×2): qty 100

## 2022-05-11 MED ORDER — CHLORHEXIDINE GLUCONATE 0.12 % MT SOLN
15.0000 mL | Freq: Once | OROMUCOSAL | Status: AC
  Administered 2022-05-11: 15 mL via OROMUCOSAL

## 2022-05-11 MED ORDER — LIDOCAINE 2% (20 MG/ML) 5 ML SYRINGE
INTRAMUSCULAR | Status: DC | PRN
  Administered 2022-05-11: 20 mg via INTRAVENOUS

## 2022-05-11 MED ORDER — METHOCARBAMOL 1000 MG/10ML IJ SOLN
500.0000 mg | Freq: Once | INTRAVENOUS | Status: AC
  Administered 2022-05-11: 500 mg via INTRAVENOUS
  Filled 2022-05-11: qty 500

## 2022-05-11 MED ORDER — MIDAZOLAM HCL 2 MG/2ML IJ SOLN
INTRAMUSCULAR | Status: AC
  Filled 2022-05-11: qty 2

## 2022-05-11 MED ORDER — METHOTREXATE 2.5 MG PO TABS: 15.0000 mg | ORAL_TABLET | ORAL | Status: DC

## 2022-05-11 MED ORDER — ACETAMINOPHEN 325 MG PO TABS
325.0000 mg | ORAL_TABLET | Freq: Four times a day (QID) | ORAL | Status: DC | PRN
  Administered 2022-05-13 – 2022-05-14 (×4): 650 mg via ORAL
  Filled 2022-05-11 (×4): qty 2

## 2022-05-11 MED ORDER — MIDAZOLAM HCL 5 MG/5ML IJ SOLN
INTRAMUSCULAR | Status: DC | PRN
  Administered 2022-05-11: 2 mg via INTRAVENOUS

## 2022-05-11 MED ORDER — ALUM & MAG HYDROXIDE-SIMETH 200-200-20 MG/5ML PO SUSP: 30.0000 mL | ORAL | Status: DC | PRN

## 2022-05-11 MED ORDER — POLYETHYLENE GLYCOL 3350 17 G PO PACK: 17.0000 g | PACK | Freq: Every day | ORAL | Status: DC | PRN

## 2022-05-11 MED ORDER — GABAPENTIN 100 MG PO CAPS: 100.0000 mg | ORAL_CAPSULE | Freq: Three times a day (TID) | ORAL | Status: DC | PRN

## 2022-05-11 MED ORDER — CELECOXIB 400 MG PO CAPS
400.0000 mg | ORAL_CAPSULE | Freq: Once | ORAL | Status: AC
  Administered 2022-05-11: 400 mg via ORAL
  Filled 2022-05-11: qty 1

## 2022-05-11 MED ORDER — DEXAMETHASONE SODIUM PHOSPHATE 10 MG/ML IJ SOLN
INTRAMUSCULAR | Status: AC
  Filled 2022-05-11: qty 1

## 2022-05-11 MED ORDER — METHOCARBAMOL 500 MG PO TABS
500.0000 mg | ORAL_TABLET | Freq: Four times a day (QID) | ORAL | Status: DC | PRN
  Administered 2022-05-12: 500 mg via ORAL
  Filled 2022-05-11: qty 1

## 2022-05-11 MED ORDER — ONDANSETRON HCL 4 MG/2ML IJ SOLN
4.0000 mg | Freq: Once | INTRAMUSCULAR | Status: AC
  Administered 2022-05-11: 4 mg via INTRAVENOUS
  Filled 2022-05-11: qty 2

## 2022-05-11 MED ORDER — DIPHENHYDRAMINE HCL 50 MG/ML IJ SOLN
INTRAMUSCULAR | Status: AC
  Filled 2022-05-11: qty 1

## 2022-05-11 MED ORDER — SODIUM CHLORIDE 0.9 % IR SOLN
Status: DC | PRN
  Administered 2022-05-11: 3000 mL

## 2022-05-11 MED ORDER — DEXAMETHASONE SODIUM PHOSPHATE 4 MG/ML IJ SOLN
INTRAMUSCULAR | Status: AC
  Filled 2022-05-11: qty 2

## 2022-05-11 MED ORDER — SURGIPHOR WOUND IRRIGATION SYSTEM - OPTIME
TOPICAL | Status: DC | PRN
  Administered 2022-05-11: 450 mL

## 2022-05-11 MED ORDER — ZINC GLUCONATE 50 MG PO TABS
50.0000 mg | ORAL_TABLET | Freq: Every day | ORAL | Status: DC
  Filled 2022-05-11 (×2): qty 1

## 2022-05-11 MED ORDER — KETAMINE HCL 10 MG/ML IJ SOLN
INTRAMUSCULAR | Status: DC | PRN
  Administered 2022-05-11 (×3): 10 mg via INTRAVENOUS

## 2022-05-11 MED ORDER — ONDANSETRON HCL 4 MG/2ML IJ SOLN
INTRAMUSCULAR | Status: AC
  Filled 2022-05-11: qty 2

## 2022-05-11 MED ORDER — FENTANYL CITRATE (PF) 100 MCG/2ML IJ SOLN
INTRAMUSCULAR | Status: DC | PRN
  Administered 2022-05-11 (×5): 50 ug via INTRAVENOUS

## 2022-05-11 MED ORDER — ORAL CARE MOUTH RINSE: 15.0000 mL | Freq: Once | OROMUCOSAL | Status: AC

## 2022-05-11 MED ORDER — CELECOXIB 200 MG PO CAPS
200.0000 mg | ORAL_CAPSULE | Freq: Two times a day (BID) | ORAL | Status: DC
  Administered 2022-05-11 – 2022-05-12 (×2): 200 mg via ORAL
  Filled 2022-05-11 (×2): qty 2
  Filled 2022-05-11: qty 1
  Filled 2022-05-11: qty 2
  Filled 2022-05-11 (×3): qty 1

## 2022-05-11 MED ORDER — PROPOFOL 10 MG/ML IV BOLUS
INTRAVENOUS | Status: DC | PRN
  Administered 2022-05-11: 200 mg via INTRAVENOUS

## 2022-05-11 MED ORDER — CEFAZOLIN SODIUM-DEXTROSE 2-4 GM/100ML-% IV SOLN
2.0000 g | INTRAVENOUS | Status: AC
  Administered 2022-05-11: 2 g via INTRAVENOUS

## 2022-05-11 MED ORDER — PHENOL 1.4 % MT LIQD: 1.0000 | OROMUCOSAL | Status: DC | PRN

## 2022-05-11 MED ORDER — SUGAMMADEX SODIUM 500 MG/5ML IV SOLN
INTRAVENOUS | Status: AC
  Filled 2022-05-11: qty 5

## 2022-05-11 MED ORDER — DEXAMETHASONE SODIUM PHOSPHATE 10 MG/ML IJ SOLN
10.0000 mg | Freq: Once | INTRAMUSCULAR | Status: AC
  Administered 2022-05-12: 10 mg via INTRAVENOUS
  Filled 2022-05-11: qty 1

## 2022-05-11 MED ORDER — DIPHENHYDRAMINE HCL 50 MG/ML IJ SOLN
INTRAMUSCULAR | Status: DC | PRN
  Administered 2022-05-11: 6.25 mg via INTRAVENOUS

## 2022-05-11 MED ORDER — MEPERIDINE HCL 50 MG/ML IJ SOLN: 6.2500 mg | INTRAMUSCULAR | Status: DC | PRN

## 2022-05-11 SURGICAL SUPPLY — 60 items
ATTUNE MED DOME PAT 38 KNEE (Knees) IMPLANT
ATTUNE PSFEM RTSZ6 NARCEM KNEE (Femur) IMPLANT
BANDAGE ESMARK 6X9 LF (GAUZE/BANDAGES/DRESSINGS) ×2 IMPLANT
BASEPLATE TIB CMT FB PCKT SZ5 (Knees) IMPLANT
BLADE SAGITTAL 25.0X1.27X90 (BLADE) ×2 IMPLANT
BLADE SAW SGTL 11.0X1.19X90.0M (BLADE) ×2 IMPLANT
BNDG CMPR 9X6 STRL LF SNTH (GAUZE/BANDAGES/DRESSINGS) ×1
BNDG ESMARK 6X9 LF (GAUZE/BANDAGES/DRESSINGS) ×1
BSPLAT TIB 5 CMNT FXBRNG STRL (Knees) ×1 IMPLANT
CEMENT HV SMART SET (Cement) ×4 IMPLANT
CLOTH BEACON ORANGE TIMEOUT ST (SAFETY) ×2 IMPLANT
COOLER ICEMAN CLASSIC (MISCELLANEOUS) ×2 IMPLANT
COVER LIGHT HANDLE STERIS (MISCELLANEOUS) ×4 IMPLANT
CUFF TOURN SGL QUICK 34 (TOURNIQUET CUFF) ×1
CUFF TRNQT CYL 34X4.125X (TOURNIQUET CUFF) ×2 IMPLANT
DRAPE BACK TABLE (DRAPES) ×2 IMPLANT
DRAPE EXTREMITY T 121X128X90 (DISPOSABLE) ×2 IMPLANT
DRESSING AQUACEL AG ADV 3.5X12 (MISCELLANEOUS) ×2 IMPLANT
DRSG AQUACEL AG ADV 3.5X12 (MISCELLANEOUS) ×1
DURAPREP 26ML APPLICATOR (WOUND CARE) ×4 IMPLANT
ELECT REM PT RETURN 9FT ADLT (ELECTROSURGICAL) ×1
ELECTRODE REM PT RTRN 9FT ADLT (ELECTROSURGICAL) ×2 IMPLANT
GLOVE BIO SURGEON STRL SZ7 (GLOVE) IMPLANT
GLOVE BIOGEL PI IND STRL 7.0 (GLOVE) ×6 IMPLANT
GLOVE BIOGEL PI IND STRL 8.5 (GLOVE) ×2 IMPLANT
GLOVE ECLIPSE 6.5 STRL STRAW (GLOVE) IMPLANT
GLOVE SKINSENSE STRL SZ8.0 LF (GLOVE) ×2 IMPLANT
GOWN STRL REUS W/TWL LRG LVL3 (GOWN DISPOSABLE) ×6 IMPLANT
GOWN STRL REUS W/TWL XL LVL3 (GOWN DISPOSABLE) ×2 IMPLANT
HANDPIECE INTERPULSE COAX TIP (DISPOSABLE) ×1
HOOD W/PEELAWAY (MISCELLANEOUS) ×8 IMPLANT
INSERT TIB PS FB ATTUNE SZ6X5 (Knees) IMPLANT
INST SET MAJOR BONE (KITS) ×2 IMPLANT
IV NS IRRIG 3000ML ARTHROMATIC (IV SOLUTION) ×2 IMPLANT
KIT BLADEGUARD II DBL (SET/KITS/TRAYS/PACK) ×2 IMPLANT
KIT TURNOVER KIT A (KITS) ×2 IMPLANT
MANIFOLD NEPTUNE II (INSTRUMENTS) ×2 IMPLANT
MARKER SKIN DUAL TIP RULER LAB (MISCELLANEOUS) ×2 IMPLANT
NS IRRIG 1000ML POUR BTL (IV SOLUTION) ×2 IMPLANT
PACK TOTAL JOINT (CUSTOM PROCEDURE TRAY) ×2 IMPLANT
PAD ARMBOARD 7.5X6 YLW CONV (MISCELLANEOUS) ×2 IMPLANT
PAD COLD SHLDR SM WRAP-ON (PAD) ×2 IMPLANT
PILLOW KNEE EXTENSION 0 DEG (MISCELLANEOUS) ×2 IMPLANT
SAW OSC TIP CART 19.5X105X1.3 (SAW) ×2 IMPLANT
SET BASIN LINEN APH (SET/KITS/TRAYS/PACK) ×2 IMPLANT
SET HNDPC FAN SPRY TIP SCT (DISPOSABLE) ×2 IMPLANT
SOLUTION IRRIG SURGIPHOR (IV SOLUTION) IMPLANT
STAPLER VISISTAT 35W (STAPLE) ×2 IMPLANT
SUT BRALON NAB BRD #1 30IN (SUTURE) ×2 IMPLANT
SUT MNCRL 0 VIOLET CTX 36 (SUTURE) ×2 IMPLANT
SUT MON AB 0 CT1 (SUTURE) ×2 IMPLANT
SUT MONOCRYL 0 CTX 36 (SUTURE) ×1
SUT VIC AB 1 CT1 27 (SUTURE) ×1
SUT VIC AB 1 CT1 27XBRD ANTBC (SUTURE) IMPLANT
SYR BULB IRRIG 60ML STRL (SYRINGE) ×2 IMPLANT
TOWEL OR 17X26 4PK STRL BLUE (TOWEL DISPOSABLE) ×2 IMPLANT
TOWER CARTRIDGE SMART MIX (DISPOSABLE) ×2 IMPLANT
TRAY FOLEY MTR SLVR 16FR STAT (SET/KITS/TRAYS/PACK) ×2 IMPLANT
WATER STERILE IRR 1000ML POUR (IV SOLUTION) ×4 IMPLANT
YANKAUER SUCT 12FT TUBE ARGYLE (SUCTIONS) ×2 IMPLANT

## 2022-05-11 NOTE — Evaluation (Signed)
Physical Therapy Evaluation Patient Details Name: Whitney Moreno MRN: [id number] DOB: Oct 17, 1950 Today's Date: 05/11/2022   RIGHT KNEE ROM:  0 - 88 degrees AMBULATION DISTANCE: 35 feet using RW with Min guard/Min assist   History of Present Illness  Whitney Moreno is a 72 y/o female, s/p Right TKA on 05/11/22 with the diagnosis of Osteoarthritis right knee.  Clinical Impression  Patient instructed in and issued HEP with fair/good carryover demonstrated.  Patient demonstrates slow labored cadence with fair carryover for right heel to toe stepping without loss of balance, limited mostly due to c/o dizziness and requested to go back to bed after therapy - RN aware.  Patient will benefit from continued skilled physical therapy in hospital and recommended venue below to increase strength, balance, endurance for safe ADLs and gait.        Recommendations for follow up therapy are one component of a multi-disciplinary discharge planning process, led by the attending physician.  Recommendations may be updated based on patient status, additional functional criteria and insurance authorization.  Follow Up Recommendations Home health PT      Assistance Recommended at Discharge Set up Supervision/Assistance  Patient can return home with the following  A little help with walking and/or transfers;A little help with bathing/dressing/bathroom;Help with stairs or ramp for entrance;Assistance with cooking/housework    Equipment Recommendations None recommended by PT  Recommendations for Other Services       Functional Status Assessment Patient has had a recent decline in their functional status and demonstrates the ability to make significant improvements in function in a reasonable and predictable amount of time.     Precautions / Restrictions Precautions Precautions: Fall Restrictions Weight Bearing Restrictions: Yes RLE Weight Bearing: Weight bearing as tolerated      Mobility  Bed  Mobility Overal bed mobility: Needs Assistance Bed Mobility: Supine to Sit     Supine to sit: Min guard, Supervision     General bed mobility comments: fair/good return for moving RLE during bed mobility with occasional tactile cueing    Transfers Overall transfer level: Needs assistance Equipment used: Rolling walker (2 wheels) Transfers: Sit to/from Stand, Bed to chair/wheelchair/BSC Sit to Stand: Min assist   Step pivot transfers: Min guard, Min assist       General transfer comment: slow labored movement    Ambulation/Gait Ambulation/Gait assistance: Min guard, Min assist Gait Distance (Feet): 35 Feet Assistive device: Rolling walker (2 wheels) Gait Pattern/deviations: Decreased step length - right, Decreased step length - left, Decreased stride length, Decreased stance time - right, Antalgic Gait velocity: decreased     General Gait Details: slow labord cadence with fair return for right heel to toe stepping without loss of balance, limited mostly due to fatigue and c/o dizziness  Stairs            Wheelchair Mobility    Modified Rankin (Stroke Patients Only)       Balance Overall balance assessment: Needs assistance Sitting-balance support: Feet supported, No upper extremity supported Sitting balance-Leahy Scale: Fair Sitting balance - Comments: fair/good seated at EOB   Standing balance support: During functional activity, Bilateral upper extremity supported Standing balance-Leahy Scale: Fair Standing balance comment: fair/good using RW                             Pertinent Vitals/Pain Pain Assessment Pain Assessment: 0-10 Pain Score: 4  Pain Location: right knee Pain Descriptors / Indicators: Sore, Grimacing,  Discomfort Pain Intervention(s): Limited activity within patient's tolerance, Monitored during session, Repositioned    Home Living Family/patient expects to be discharged to:: Private residence Living Arrangements:  Spouse/significant other Available Help at Discharge: Family;Available 24 hours/day Type of Home: House Home Access: Stairs to enter Entrance Stairs-Rails: Right;Left;Can reach both Entrance Stairs-Number of Steps: 6   Home Layout: One level Home Equipment: Conservation officer, nature (2 wheels);Shower seat;Grab bars - tub/shower      Prior Function Prior Level of Function : Independent/Modified Independent             Mobility Comments: Hydrographic surveyor, drives ADLs Comments: Independent     Hand Dominance   Dominant Hand: Right    Extremity/Trunk Assessment   Upper Extremity Assessment Upper Extremity Assessment: Overall WFL for tasks assessed    Lower Extremity Assessment Lower Extremity Assessment: Generalized weakness;RLE deficits/detail RLE Deficits / Details: grossly -4/5 RLE: Unable to fully assess due to pain RLE Sensation: WNL RLE Coordination: WNL    Cervical / Trunk Assessment Cervical / Trunk Assessment: Normal  Communication   Communication: No difficulties  Cognition Arousal/Alertness: Awake/alert Behavior During Therapy: WFL for tasks assessed/performed Overall Cognitive Status: Within Functional Limits for tasks assessed                                          General Comments      Exercises Total Joint Exercises Ankle Circles/Pumps: Supine, 10 reps, Right, AROM, Strengthening Quad Sets: AROM, Strengthening, Right, 10 reps, Supine Short Arc Quad: AROM, Strengthening, AAROM, 10 reps, Supine, Right Heel Slides: AROM, AAROM, Strengthening, Right, 10 reps, Supine Goniometric ROM: Right knee: 0 - 88 degrees   Assessment/Plan    PT Assessment Patient needs continued PT services  PT Problem List Decreased strength;Decreased range of motion;Decreased activity tolerance;Decreased balance;Decreased mobility       PT Treatment Interventions DME instruction;Gait training;Stair training;Functional mobility training;Therapeutic  activities;Therapeutic exercise;Patient/family education;Balance training    PT Goals (Current goals can be found in the Care Plan section)  Acute Rehab PT Goals Patient Stated Goal: return home with family to assist PT Goal Formulation: With patient/family Time For Goal Achievement: 05/13/22 Potential to Achieve Goals: Good    Frequency BID     Co-evaluation               AM-PAC PT "6 Clicks" Mobility  Outcome Measure Help needed turning from your back to your side while in a flat bed without using bedrails?: A Little Help needed moving from lying on your back to sitting on the side of a flat bed without using bedrails?: A Little Help needed moving to and from a bed to a chair (including a wheelchair)?: A Little Help needed standing up from a chair using your arms (e.g., wheelchair or bedside chair)?: A Little Help needed to walk in hospital room?: A Little Help needed climbing 3-5 steps with a railing? : A Lot 6 Click Score: 17    End of Session   Activity Tolerance: Patient tolerated treatment well;Patient limited by fatigue Patient left: in bed;with call bell/phone within reach Nurse Communication: Mobility status PT Visit Diagnosis: Unsteadiness on feet (R26.81);Other abnormalities of gait and mobility (R26.89);Muscle weakness (generalized) (M62.81)    Time: 4098-1191 PT Time Calculation (min) (ACUTE ONLY): 31 min   Charges:   PT Evaluation $PT Eval Moderate Complexity: 1 Mod PT Treatments $Therapeutic Activity: 23-37 mins  3:50 PM, 05/11/22 Lonell Grandchild, MPT Physical Therapist with [facility] [phone] office [phone] mobile phone

## 2022-05-11 NOTE — Interval H&P Note (Signed)
History and Physical Interval Note:  05/11/2022 7:25 AM  Whitney Moreno  has presented today for surgery, with the diagnosis of Osteoarthritis right knee.  The various methods of treatment have been discussed with the patient and family. After consideration of risks, benefits and other options for treatment, the patient has consented to  Procedure(s): TOTAL KNEE ARTHROPLASTY (Right) as a surgical intervention.  The patient's history has been reviewed, patient examined, no change in status, stable for surgery.  I have reviewed the patient's chart and labs.  Questions were answered to the patient's satisfaction.     Arther Abbott

## 2022-05-11 NOTE — Brief Op Note (Signed)
05/11/2022  10:19 AM  PATIENT:  Whitney Moreno  72 y.o. female  PRE-OPERATIVE DIAGNOSIS:  Osteoarthritis right knee  POST-OPERATIVE DIAGNOSIS:  Osteoarthritis right knee  PROCEDURE:  Procedure(s): TOTAL KNEE ARTHROPLASTY (Right)  SURGEON:  Surgeon(s) and Role:    Carole Civil, MD - Primary  PHYSICIAN ASSISTANT:   ASSISTANTS: nicki ocx and angie  ...   ANESTHESIA:   general and saphenous n block   EBL:  100 mL   BLOOD ADMINISTERED:none  DRAINS:  no    LOCAL MEDICATIONS USED:  OTHER zinrelief  SPECIMEN:  No Specimen  DISPOSITION OF SPECIMEN:  N/A  COUNTS:  YES  TOURNIQUET:   Total Tourniquet Time Documented: Thigh (Right) - 113 minutes Total: Thigh (Right) - 113 minutes   DICTATION: .Viviann Spare Dictation  PLAN OF CARE: Admit for overnight observation  PATIENT DISPOSITION:  PACU - hemodynamically stable.   Delay start of Pharmacological VTE agent (>24hrs) due to surgical blood loss or risk of bleeding: not applicable

## 2022-05-11 NOTE — Addendum Note (Signed)
Addendum  created 05/11/22 1323 by Denese Killings, MD   Clinical Note Signed

## 2022-05-11 NOTE — Anesthesia Preprocedure Evaluation (Signed)
Anesthesia Evaluation  Patient identified by MRN, date of birth, ID band Patient awake    Reviewed: Allergy & Precautions, H&P , NPO status , Patient's Chart, lab work & pertinent test results  History of Anesthesia Complications (+) PONV and history of anesthetic complications  Airway Mallampati: II  TM Distance: >3 FB Neck ROM: Full    Dental  (+) Missing, Dental Advisory Given [place] :   Pulmonary neg pulmonary ROS, former smoker   Pulmonary exam normal breath sounds clear to auscultation       Cardiovascular negative cardio ROS Normal cardiovascular exam Rhythm:Regular Rate:Normal     Neuro/Psych  Neuromuscular disease (neuropathy, bilateral lower extremity numbness)  negative psych ROS   GI/Hepatic negative GI ROS, Neg liver ROS,,,  Endo/Other  negative endocrine ROS    Renal/GU negative Renal ROS  negative genitourinary   Musculoskeletal  (+) Arthritis , Osteoarthritis,  Fibromyalgia -  Abdominal   Peds negative pediatric ROS (+)  Hematology  (+) Blood dyscrasia, anemia   Anesthesia Other Findings   Reproductive/Obstetrics negative OB ROS                             Anesthesia Physical Anesthesia Plan  ASA: 2  Anesthesia Plan: General   Post-op Pain Management: Regional block* and Dilaudid IV   Induction: Intravenous  PONV Risk Score and Plan: 4 or greater and Ondansetron, Dexamethasone, Midazolam and Scopolamine patch - Pre-op  Airway Management Planned: Oral ETT  Additional Equipment:   Intra-op Plan:   Post-operative Plan: Extubation in OR  Informed Consent: I have reviewed the patients History and Physical, chart, labs and discussed the procedure including the risks, benefits and alternatives for the proposed anesthesia with the patient or authorized representative who has indicated his/her understanding and acceptance.     Dental advisory given  Plan  Discussed with: CRNA and Surgeon  Anesthesia Plan Comments: (Risk of neuropathy getting worse was explained, agreed to get procedure under GA with airway.)        Anesthesia Quick Evaluation

## 2022-05-11 NOTE — Progress Notes (Signed)
Nursing student was called into room at 1600 with patient complaints of "dizziness" after ambulating with PT. V/S checked and documents. O2 82% on RA. Other V/S WNL. RN notified. O2 2L applied. Sats 99% on 2L [place]. Will continue to monitor patient.   Sharene Skeans, BSN, [place]

## 2022-05-11 NOTE — Plan of Care (Signed)
  Problem: Education: Goal: Individualized Educational Video(s) Outcome: Progressing   Problem: Pain Management: Goal: Pain level will decrease with appropriate interventions Outcome: Progressing   Problem: Education: Goal: Knowledge of General Education information will improve Description: Including pain rating scale, medication(s)/side effects and non-pharmacologic comfort measures Outcome: Progressing

## 2022-05-11 NOTE — Progress Notes (Signed)
Regional Block - Right knee  Present: Reed Breech, RN Nicholes Calamity, CRNA Dr. Charna Elizabeth, MD - anethesiology  Correct patient, site and procedure confirmed Site marked by Dr. Aline Brochure   Time Out - 0728 Time Start - 0729  Time End -  [phone]

## 2022-05-11 NOTE — Anesthesia Procedure Notes (Signed)
Anesthesia Regional Block: Adductor canal block   Pre-Anesthetic Checklist: , timeout performed,  Correct Patient, Correct Site, Correct Laterality,  Correct Procedure, Correct Position, site marked,  Risks and benefits discussed,  At surgeon's request and post-op pain management  Laterality: Right and Lower  Prep: chloraprep       Needles:  Injection technique: Single-shot  Needle Type: Echogenic Stimulator Needle     Needle Length: 10cm  Needle Gauge: 20   Needle insertion depth: 7 cm   Additional Needles:   Procedures:, nerve stimulator,,, ultrasound used (permanent image in chart),,     Nerve Stimulator or Paresthesia:  Response: Twitch elicited, 0.6 mA, 7 cm  Additional Responses:   Narrative:  Start time: 05/11/2022 7:29 AM End time: 05/11/2022 7:38 AM Injection made incrementally with aspirations every 5 mL.  Performed by: Personally  Anesthesiologist: Denese Killings, MD  Additional Notes: BP cuff, EKG monitors applied. Sedation begun.  After nerve location anesthetic injected incrementally, slowly , and after neg aspirations. Tolerated well.

## 2022-05-11 NOTE — Op Note (Signed)
Orthopaedic Surgery Operative Note (CSN: [id number])  Whitney Moreno  April 09, 1951 Date of Surgery: 05/11/2022   Diagnoses:  Osteoarthritis right knee  Procedure: Right total knee arthroplasty   Operative Finding Successful completion of the planned procedure.     Post-Op Diagnosis: Same Surgeons:Primary: Carole Civil, MD Assistants: Franklyn Lor and Angie Location: [facility] OR ROOM 4 Anesthesia: General and saphenous nerve block Antibiotics: Ancef 2 g Tourniquet time:  Total Tourniquet Time Documented: Thigh (Right) - 113 minutes Total: Thigh (Right) - 113 minutes  Estimated Blood Loss: min Complications: None Specimens: None Implants: Implant Name Type Inv. Item Serial No. Manufacturer Lot No. LRB No. Used Action  CEMENT HV SMART SET - [id number] Cement CEMENT HV SMART SET  [employer] [id number] Right 1 Implanted  CEMENT HV SMART SET - [id number] Cement CEMENT HV SMART SET  DEPU[employer] ORTH[employer] [id number] Right 1 Implanted  INSERT TIB PS FB ATTUNE SZ6X5 - [id number] Knees INSERT TIB PS FB ATTUNE SZ6X5  DEPUY ORTHOPA[employer] [id number] Right 1 Implanted  BASEPLATE TIB CMT FB PCKT SZ5 - [id number] Knees BASEPLATE TIB CMT FB PCKT SZ5  DEPUY ORTHOPA[employer] [id number] Right 1 Implanted  ATTUNE MED DOME PAT 38 KNEE - [id number] Knees ATTUNE MED DOME PAT 38 KNEE  DEPUY ORTH[employer] [id number] Right 1 Implanted  ATTUNE PSFEM RTSZ6 NARCEM KNEE - [id number] Femur ATTUNE PSFEM RTSZ6 NARCEM KNEE  DEPU[employer] ORTH[employer] [id number] Right 1 Implanted    Indications for Surgery:   Whitney Moreno is a 72 y.o. female who succumbed to severe pain in her right knee from osteoarthritis.  Benefits and risks of operative and nonoperative management were discussed prior to surgery with patient/guardian(s) and informed consent form was completed.  Specific risks including infection, need for additional surgery, bleeding stiffness pain DVT pulmonary embolus   Procedure:   The patient was identified properly.  Informed consent was obtained and the surgical site was marked. The patient was taken to the OR where general anesthesia was induced.  The patient was positioned supine on the operating table.  The right knee was prepped and draped in the usual sterile fashion.  Timeout was performed before the beginning of the case.  Tourniquet was used for the above duration.  Dictation for total knee replacement   Bone cuts:   Distal femur 9+2  PROXIMAL TIBIA 3 from the deficient medial side +2  PATELLA 24 patella cut to 13        .Details of surgery: The patient was identified by 2 approved identification mechanisms. The operative extremity was evaluated and found to be acceptable for surgical treatment today. The chart was reviewed. The surgical site was confirmed and marked. The patient had a saphenous nerve block preoperatively  The patient was taken to the operating room and given appropriate antibiotic 2 g Ancef. This is consistent with the SCIP protocol.  The patient was given the following anesthetic: General plus preop saphenous nerve block  The patient was then placed supine on the operating table. A Foley catheter was inserted. The operative extremity was prepped and draped sterilely from the toes to the groin.  Timeout was executed confirming the patient's name, surgical site, antibiotic administration, x-rays available, and implants available.  The operative limb,  was exsanguinated with a six-inch Esmarch and the tourniquet was inflated to 280 mmHg.  A straight midline incision was made over the right KNEE and taken down to the extensor mechanism. A medial arthrotomy was performed. The patella was everted and the patellofemoral soft tissue was released,  along with the patellar fat pad.  The anterior cruciate ligament and PCL were resected.  The anterior horns of the lateral and medial meniscus were resected. The medial soft tissue sleeve was elevated to the mid coronal plane.  A  three-eighths inch drill bit was used to enter the femoral canal which was decompressed with suction and irrigation until clear.   The distal femoral cutting guide was set for 9 mm distal resection,  5valgus alignment, for a right knee. The distal femur was resected and checked for flatness.  The Depuy Sigma sizing femoral guide was placed and the femur was sized to a size 6.   The external alignment guide for the tibial resection was then applied to the distal and proximal tibia and set for anatomic slope along with 3 MM resection  from the deficient medial side.   Rotational alignment was set using the malleolus, the tibial tubercle and the tibial spines.  The proximal tibia was resected along with  residual menisci. The tibia was sized using a base plate to a size 5.   The extension gap was checked.  I took additional 2 mm from distal femur and proximal tibia as the initial cut seem to be thin and when actually measured did not measure the 9 and 3 mm resection that I wanted.   A 4-in-1 cutting block was placed along with collateral ligament retractors.  I set the femoral cutting block by placing the 5 mm spacer block on top of the tibia set the rotation.  Checked for notching with the angel wing.  Once I was assured there was no notching I pinned the block in place basically moving the block 1 notch or set of holes down.  Spacer blocks were used to confirm equal flexion extension gaps with releases done as needed. A size 5 MM spacer block gave equal stability and flexion extension.  The correct sized notch cutting guide for the femur was then applied and the notch cut was made.  Trial reduction was completed using size 6 narrow femur and 5 tibia 5 polyethylene insert trial implants. Patella tracking was normal  We then skeletonized the patella. It measured 24 in thickness and the patellar resection was set for 14 millimeters. the patellar resection was completed. The patella diameter  measured 38. We then drilled the peg holes for the patella.  Completed patellar thickness was 24  The proximal tibia was prepared using the size 5 base plate.  Thorough irrigation was performed and the bone was dried and prepared for cement. The cement was mixed on the back table using third generation preparation techniques    The implants were then cemented in place and excess cement was removed. The cement was allowed to cure. Irrigation was repeated with surgipor and excess and residual bone fragments and cement were removed.  ZYNRELEF was injected into the joint  The extensor mechanism was closed with #1 Bralon suture followed by subcutaneous tissue closure using 0 Monocryl suture in 2 layers   Skin approximation was performed using staples  A sterile dressing was applied, TED hose were placed on the operative extremity followed by Cryo/Cuff.  The patient was taken recovery room in stable condition  Postop plan: Weightbearing as tolerated CPM machine Immediate physical therapy Discharge tomorrow if stable

## 2022-05-11 NOTE — Anesthesia Procedure Notes (Signed)
Procedure Name: Intubation Date/Time: 05/11/2022 7:51 AM  Performed by: Gwyndolyn Saxon, CRNAPre-anesthesia Checklist: Patient identified, Emergency Drugs available, Suction available and Patient being monitored Patient Re-evaluated:Patient Re-evaluated prior to induction Oxygen Delivery Method: Circle system utilized Preoxygenation: Pre-oxygenation with 100% oxygen Induction Type: IV induction Ventilation: Mask ventilation without difficulty Laryngoscope Size: Glidescope and 3 Grade View: Grade I Tube type: Oral Tube size: 7.0 mm Number of attempts: 2 Airway Equipment and Method: Rigid stylet Placement Confirmation: ETT inserted through vocal cords under direct vision, positive ETCO2 and breath sounds checked- equal and bilateral Secured at: 20 cm Tube secured with: Tape Dental Injury: Teeth and Oropharynx as per pre-operative assessment  Difficulty Due To: Difficult Airway- due to reduced neck mobility Comments: Pt very easy mask airway without oral airway. DL #1 with miller 2; grade 3 view. Attempted to use boujie but unsucessful. DL #2 with glidescope. Grade 1 view. Atraumatic intubation.

## 2022-05-11 NOTE — Anesthesia Postprocedure Evaluation (Addendum)
Anesthesia Post Note  Patient: Whitney Moreno  Procedure(s) Performed: TOTAL KNEE ARTHROPLASTY (Right: Knee)  Patient location during evaluation: Phase II Anesthesia Type: General Level of consciousness: awake and alert, oriented and sedated Pain management: pain level controlled Vital Signs Assessment: post-procedure vital signs reviewed and stable Respiratory status: spontaneous breathing, nonlabored ventilation, respiratory function stable and patient connected to nasal cannula oxygen Cardiovascular status: blood pressure returned to baseline and stable Postop Assessment: no apparent nausea or vomiting Anesthetic complications: no  No notable events documented.   Last Vitals:  Vitals:   05/11/22 1215 05/11/22 1258  BP: (!) 156/92 (!) 148/79  Pulse: 88 81  Resp: 11 12  Temp:  36.4 C  SpO2: 95% 99%    Last Pain:  Vitals:   05/11/22 1258  TempSrc: Oral  PainSc:                  [name] C Joevon Holliman

## 2022-05-11 NOTE — Transfer of Care (Signed)
Immediate Anesthesia Transfer of Care Note  Patient: Whitney Moreno  Procedure(s) Performed: TOTAL KNEE ARTHROPLASTY (Right: Knee)  Patient Location: PACU  Anesthesia Type:GA combined with regional for post-op pain  Level of Consciousness: drowsy  Airway & Oxygen Therapy: Patient Spontanous Breathing and Patient connected to nasal cannula oxygen  Post-op Assessment: Report given to RN and Post -op Vital signs reviewed and stable  Post vital signs: Reviewed and stable  Last Vitals:  Vitals Value Taken Time  BP 138/86 05/11/22 1030  Temp    Pulse 76 05/11/22 1034  Resp 17 05/11/22 1034  SpO2 98 % 05/11/22 1034  Vitals shown include unvalidated device data.  Last Pain:  Vitals:   05/11/22 0648  TempSrc: Oral  PainSc: 0-No pain      Patients Stated Pain Goal: 6 (11/00/34 9611)  Complications: No notable events documented.

## 2022-05-11 NOTE — Plan of Care (Signed)
  Problem: Acute Rehab PT Goals(only PT should resolve) Goal: Pt Will Go Supine/Side To Sit Outcome: Progressing Flowsheets (Taken 05/11/2022 1551) Pt will go Supine/Side to Sit:  with modified independence  with supervision Goal: Patient Will Transfer Sit To/From Stand Outcome: Progressing Flowsheets (Taken 05/11/2022 1551) Patient will transfer sit to/from stand:  with modified independence  with supervision Goal: Pt Will Transfer Bed To Chair/Chair To Bed Outcome: Progressing Flowsheets (Taken 05/11/2022 1551) Pt will Transfer Bed to Chair/Chair to Bed:  with modified independence  with supervision Goal: Pt Will Ambulate Outcome: Progressing Flowsheets (Taken 05/11/2022 1551) Pt will Ambulate:  75 feet  with modified independence  with supervision  with rolling walker   3:51 PM, 05/11/22 Lonell Grandchild, MPT Physical Therapist with [facility] [phone] office [phone] mobile phone

## 2022-05-12 ENCOUNTER — Observation Stay (HOSPITAL_COMMUNITY): Payer: Medicare PPO

## 2022-05-12 ENCOUNTER — Encounter (HOSPITAL_COMMUNITY): Payer: Medicare PPO

## 2022-05-12 ENCOUNTER — Encounter (HOSPITAL_COMMUNITY): Payer: Self-pay | Admitting: Radiology

## 2022-05-12 DIAGNOSIS — G629 Polyneuropathy, unspecified: Secondary | ICD-10-CM | POA: Diagnosis present

## 2022-05-12 DIAGNOSIS — G8929 Other chronic pain: Secondary | ICD-10-CM | POA: Diagnosis present

## 2022-05-12 DIAGNOSIS — I2699 Other pulmonary embolism without acute cor pulmonale: Secondary | ICD-10-CM | POA: Diagnosis not present

## 2022-05-12 DIAGNOSIS — Z87891 Personal history of nicotine dependence: Secondary | ICD-10-CM | POA: Diagnosis not present

## 2022-05-12 DIAGNOSIS — Z803 Family history of malignant neoplasm of breast: Secondary | ICD-10-CM | POA: Diagnosis not present

## 2022-05-12 DIAGNOSIS — I468 Cardiac arrest due to other underlying condition: Secondary | ICD-10-CM | POA: Diagnosis not present

## 2022-05-12 DIAGNOSIS — I82451 Acute embolism and thrombosis of right peroneal vein: Secondary | ICD-10-CM | POA: Diagnosis not present

## 2022-05-12 DIAGNOSIS — M1711 Unilateral primary osteoarthritis, right knee: Secondary | ICD-10-CM | POA: Diagnosis present

## 2022-05-12 DIAGNOSIS — Z96652 Presence of left artificial knee joint: Secondary | ICD-10-CM | POA: Diagnosis present

## 2022-05-12 DIAGNOSIS — I2609 Other pulmonary embolism with acute cor pulmonale: Secondary | ICD-10-CM

## 2022-05-12 DIAGNOSIS — I4510 Unspecified right bundle-branch block: Secondary | ICD-10-CM | POA: Diagnosis present

## 2022-05-12 DIAGNOSIS — I82441 Acute embolism and thrombosis of right tibial vein: Secondary | ICD-10-CM | POA: Diagnosis not present

## 2022-05-12 DIAGNOSIS — I2694 Multiple subsegmental pulmonary emboli without acute cor pulmonale: Secondary | ICD-10-CM | POA: Diagnosis not present

## 2022-05-12 DIAGNOSIS — Z6835 Body mass index (BMI) 35.0-35.9, adult: Secondary | ICD-10-CM | POA: Diagnosis not present

## 2022-05-12 DIAGNOSIS — E669 Obesity, unspecified: Secondary | ICD-10-CM | POA: Diagnosis present

## 2022-05-12 DIAGNOSIS — D62 Acute posthemorrhagic anemia: Secondary | ICD-10-CM | POA: Diagnosis not present

## 2022-05-12 DIAGNOSIS — I771 Stricture of artery: Secondary | ICD-10-CM | POA: Diagnosis not present

## 2022-05-12 DIAGNOSIS — K219 Gastro-esophageal reflux disease without esophagitis: Secondary | ICD-10-CM | POA: Diagnosis present

## 2022-05-12 DIAGNOSIS — Z1152 Encounter for screening for COVID-19: Secondary | ICD-10-CM | POA: Diagnosis not present

## 2022-05-12 DIAGNOSIS — Z4682 Encounter for fitting and adjustment of non-vascular catheter: Secondary | ICD-10-CM | POA: Diagnosis not present

## 2022-05-12 DIAGNOSIS — I469 Cardiac arrest, cause unspecified: Secondary | ICD-10-CM

## 2022-05-12 DIAGNOSIS — I824Z1 Acute embolism and thrombosis of unspecified deep veins of right distal lower extremity: Secondary | ICD-10-CM | POA: Diagnosis not present

## 2022-05-12 DIAGNOSIS — J9811 Atelectasis: Secondary | ICD-10-CM | POA: Diagnosis not present

## 2022-05-12 DIAGNOSIS — Z8249 Family history of ischemic heart disease and other diseases of the circulatory system: Secondary | ICD-10-CM | POA: Diagnosis not present

## 2022-05-12 DIAGNOSIS — M069 Rheumatoid arthritis, unspecified: Secondary | ICD-10-CM | POA: Diagnosis present

## 2022-05-12 DIAGNOSIS — M797 Fibromyalgia: Secondary | ICD-10-CM | POA: Diagnosis present

## 2022-05-12 LAB — GLUCOSE, CAPILLARY: Glucose-Capillary: 172 mg/dL — ABNORMAL HIGH (ref 70–99)

## 2022-05-12 LAB — CBC
HCT: 33.8 % — ABNORMAL LOW (ref 36.0–46.0)
Hemoglobin: 10.8 g/dL — ABNORMAL LOW (ref 12.0–15.0)
MCH: 30.6 pg (ref 26.0–34.0)
MCHC: 32 g/dL (ref 30.0–36.0)
MCV: 95.8 fL (ref 80.0–100.0)
Platelets: 235 10*3/uL (ref 150–400)
RBC: 3.53 MIL/uL — ABNORMAL LOW (ref 3.87–5.11)
RDW: 13.8 % (ref 11.5–15.5)
WBC: 13.1 10*3/uL — ABNORMAL HIGH (ref 4.0–10.5)
nRBC: 0 % (ref 0.0–0.2)

## 2022-05-12 LAB — BLOOD GAS, ARTERIAL
Acid-base deficit: 5.9 mmol/L — ABNORMAL HIGH (ref 0.0–2.0)
Bicarbonate: 21.9 mmol/L (ref 20.0–28.0)
Drawn by: 22766
FIO2: 100 %
O2 Saturation: 100 %
Patient temperature: 36.9
pCO2 arterial: 51 mmHg — ABNORMAL HIGH (ref 32–48)
pH, Arterial: 7.24 — ABNORMAL LOW (ref 7.35–7.45)
pO2, Arterial: 229 mmHg — ABNORMAL HIGH (ref 83–108)

## 2022-05-12 LAB — BASIC METABOLIC PANEL
Anion gap: 7 (ref 5–15)
BUN: 16 mg/dL (ref 8–23)
CO2: 24 mmol/L (ref 22–32)
Calcium: 8.1 mg/dL — ABNORMAL LOW (ref 8.9–10.3)
Chloride: 106 mmol/L (ref 98–111)
Creatinine, Ser: 0.69 mg/dL (ref 0.44–1.00)
GFR, Estimated: >60 mL/min (ref >60)
Glucose, Bld: 146 mg/dL — ABNORMAL HIGH (ref 70–99)
Potassium: 4 mmol/L (ref 3.5–5.1)
Sodium: 137 mmol/L (ref 135–145)

## 2022-05-12 LAB — ECHOCARDIOGRAM COMPLETE
Area-P 1/2: 3.6 cm2
Height: 66 in
S' Lateral: 2.1 cm
Weight: 3527.36 oz

## 2022-05-12 LAB — TROPONIN I (HIGH SENSITIVITY)
Troponin I (High Sensitivity): 19 ng/L — ABNORMAL HIGH (ref <18)
Troponin I (High Sensitivity): 637 ng/L (ref <18)

## 2022-05-12 LAB — HEPARIN LEVEL (UNFRACTIONATED): Heparin Unfractionated: >1.1 IU/mL — ABNORMAL HIGH (ref 0.30–0.70)

## 2022-05-12 LAB — MRSA NEXT GEN BY PCR, NASAL: MRSA by PCR Next Gen: NOT DETECTED

## 2022-05-12 MED ORDER — HEPARIN (PORCINE) 25000 UT/250ML-% IV SOLN
1450.0000 [IU]/h | INTRAVENOUS | Status: DC
  Administered 2022-05-12: 1450 [IU]/h via INTRAVENOUS
  Filled 2022-05-12: qty 250

## 2022-05-12 MED ORDER — DOCUSATE SODIUM 100 MG PO CAPS
100.0000 mg | ORAL_CAPSULE | Freq: Two times a day (BID) | ORAL | Status: DC
  Administered 2022-05-12 – 2022-05-16 (×8): 100 mg via ORAL
  Filled 2022-05-12 (×8): qty 1

## 2022-05-12 MED ORDER — LACTULOSE 10 GM/15ML PO SOLN
10.0000 g | Freq: Every day | ORAL | Status: DC
  Administered 2022-05-13 – 2022-05-16 (×4): 10 g via ORAL
  Filled 2022-05-12 (×4): qty 30

## 2022-05-12 MED ORDER — ACETAMINOPHEN-CODEINE 300-30 MG PO TABS
1.0000 | ORAL_TABLET | ORAL | Status: DC | PRN
  Administered 2022-05-13 – 2022-05-16 (×6): 1 via ORAL
  Filled 2022-05-12 (×5): qty 1
  Filled 2022-05-12: qty 2
  Filled 2022-05-12: qty 1

## 2022-05-12 MED ORDER — IOHEXOL 350 MG/ML SOLN
75.0000 mL | Freq: Once | INTRAVENOUS | Status: AC | PRN
  Administered 2022-05-12: 75 mL via INTRAVENOUS

## 2022-05-12 MED ORDER — ENOXAPARIN SODIUM 40 MG/0.4ML IJ SOSY
40.0000 mg | PREFILLED_SYRINGE | INTRAMUSCULAR | Status: DC
  Administered 2022-05-12: 40 mg via SUBCUTANEOUS
  Filled 2022-05-12: qty 0.4

## 2022-05-12 MED ORDER — DOCUSATE SODIUM 50 MG/5ML PO LIQD
100.0000 mg | Freq: Two times a day (BID) | ORAL | Status: DC
  Filled 2022-05-12: qty 10

## 2022-05-12 MED ORDER — CHLORHEXIDINE GLUCONATE CLOTH 2 % EX PADS
6.0000 | MEDICATED_PAD | Freq: Every day | CUTANEOUS | Status: DC
  Administered 2022-05-12 – 2022-05-16 (×5): 6 via TOPICAL

## 2022-05-12 MED ORDER — PROPOFOL 1000 MG/100ML IV EMUL: 0.0000 ug/kg/min | INTRAVENOUS | Status: DC

## 2022-05-12 MED ORDER — FOLIC ACID 1 MG PO TABS
1.0000 mg | ORAL_TABLET | Freq: Every day | ORAL | Status: DC
  Administered 2022-05-13 – 2022-05-16 (×4): 1 mg via ORAL
  Filled 2022-05-12 (×4): qty 1

## 2022-05-12 MED ORDER — PERFLUTREN LIPID MICROSPHERE
1.0000 mL | INTRAVENOUS | Status: AC | PRN
  Administered 2022-05-12: 4 mL via INTRAVENOUS

## 2022-05-12 MED ORDER — CHLORHEXIDINE GLUCONATE CLOTH 2 % EX PADS
6.0000 | MEDICATED_PAD | Freq: Every day | CUTANEOUS | Status: DC
  Administered 2022-05-12: 6 via TOPICAL

## 2022-05-12 MED ORDER — DULOXETINE HCL 60 MG PO CPEP
60.0000 mg | ORAL_CAPSULE | Freq: Every day | ORAL | Status: DC
  Administered 2022-05-13 – 2022-05-16 (×4): 60 mg via ORAL
  Filled 2022-05-12 (×4): qty 1

## 2022-05-12 MED ORDER — SUCCINYLCHOLINE CHLORIDE 200 MG/10ML IV SOSY
PREFILLED_SYRINGE | INTRAVENOUS | Status: AC
  Administered 2022-05-12: 200 mg
  Filled 2022-05-12: qty 10

## 2022-05-12 MED ORDER — POLYETHYLENE GLYCOL 3350 17 G PO PACK
17.0000 g | PACK | Freq: Every day | ORAL | Status: DC
  Administered 2022-05-12: 17 g
  Filled 2022-05-12: qty 1

## 2022-05-12 MED ORDER — HEPARIN BOLUS VIA INFUSION
5500.0000 [IU] | Freq: Once | INTRAVENOUS | Status: AC
  Administered 2022-05-12: 5500 [IU] via INTRAVENOUS
  Filled 2022-05-12: qty 5500

## 2022-05-12 MED ORDER — PROPOFOL 1000 MG/100ML IV EMUL
INTRAVENOUS | Status: AC
  Filled 2022-05-12: qty 100

## 2022-05-12 MED ORDER — DEXTROSE IN LACTATED RINGERS 5 % IV SOLN: INTRAVENOUS | Status: DC

## 2022-05-12 MED ORDER — HEPARIN (PORCINE) 25000 UT/250ML-% IV SOLN
1250.0000 [IU]/h | INTRAVENOUS | Status: DC
  Administered 2022-05-13: 1250 [IU]/h via INTRAVENOUS
  Filled 2022-05-12: qty 250

## 2022-05-12 MED ORDER — MIDAZOLAM HCL 2 MG/2ML IJ SOLN
1.0000 mg | INTRAMUSCULAR | Status: DC | PRN
  Filled 2022-05-12: qty 2

## 2022-05-12 MED ORDER — FENTANYL CITRATE (PF) 100 MCG/2ML IJ SOLN
INTRAMUSCULAR | Status: AC
  Administered 2022-05-12: 100 ug
  Filled 2022-05-12: qty 2

## 2022-05-12 MED ORDER — PANTOPRAZOLE SODIUM 40 MG PO TBEC
40.0000 mg | DELAYED_RELEASE_TABLET | Freq: Every day | ORAL | Status: DC
  Administered 2022-05-13 – 2022-05-16 (×4): 40 mg via ORAL
  Filled 2022-05-12 (×4): qty 1

## 2022-05-12 MED ORDER — PANTOPRAZOLE SODIUM 40 MG IV SOLR: 40.0000 mg | INTRAVENOUS | Status: DC

## 2022-05-12 MED ORDER — GABAPENTIN 100 MG PO CAPS
100.0000 mg | ORAL_CAPSULE | Freq: Three times a day (TID) | ORAL | Status: DC | PRN
  Administered 2022-05-13 – 2022-05-15 (×7): 100 mg via ORAL
  Filled 2022-05-12 (×7): qty 1

## 2022-05-12 MED ORDER — PANTOPRAZOLE SODIUM 40 MG PO TBEC: 40.0000 mg | DELAYED_RELEASE_TABLET | Freq: Every day | ORAL | Status: DC

## 2022-05-12 MED ORDER — SODIUM CHLORIDE 0.9 % IV BOLUS
500.0000 mL | Freq: Once | INTRAVENOUS | Status: AC
  Administered 2022-05-12: 500 mL via INTRAVENOUS

## 2022-05-12 MED ORDER — ETOMIDATE 2 MG/ML IV SOLN
INTRAVENOUS | Status: AC
  Filled 2022-05-12: qty 10

## 2022-05-12 MED FILL — Medication: Qty: 1 | Status: AC

## 2022-05-12 NOTE — Procedures (Signed)
I was called to bedside from the emergency department as the patient was in cardiac arrest.  On arrival the patient was receiving CPR, though soon thereafter the patient had return of spontaneous circulation, spontaneous motion, but was not protecting her airway, drooling, not appropriately interactive.  Patient required rapid sequence intubation, performed without complication, as below.   INTUBATION Performed by: Carmin Muskrat  Required items: required blood products, implants, devices, and special equipment available Patient identity confirmed: provided demographic data and hospital-assigned identification number Time out: Immediately prior to procedure a "time out" was called to verify the correct patient, procedure, equipment, support staff and site/side marked as required.  Indications: airway protection / cardiac arrest  Intubation method: Glidescope Laryngoscopy   Preoxygenation: BVM  Sedatives: 20Etomidate Paralytic: 100 rocuronium  Tube Size: 7.5 cuffed  Post-procedure assessment: chest rise and ETCO2 monitor Breath sounds: equal and absent over the epigastrium Tube secured with: ETT holder  X-ray visualized with appropriate ET tube placement   Patient tolerated the procedure well with no immediate complications.

## 2022-05-12 NOTE — Progress Notes (Signed)
Pharmacy called to ask about timing of post-opTranexamic acid dose.  Pharmacist stated he would push out the timing of the dose to 1315.

## 2022-05-12 NOTE — Procedures (Signed)
Extubation Procedure Note  Patient Details:   Name: Whitney Moreno DOB: [birth year] MRN: [id number]   Airway Documentation:    Vent end date: 05/12/22 Vent end time: 1249   Evaluation  O2 sats: stable throughout Complications: No apparent complications Patient did tolerate procedure well. Bilateral Breath Sounds: Clear   Yes patient had weaned on CPAP/PS prior to extubation per Dr Elsworth Soho.  Able to follow all verbal commands prior to pulling tube, audible cuff leak noted as well.  Extubated to 4 L [place] sat 100%, RR 18-20, HR 88. Good, strong productive cough, stated name and location she is in and asked for her mouth to be cleaned out.  Daughter and sister at bedside throughout. Will continue to monitor.  Vaughan Basta Franklin 05/12/2022, 12:52 PM

## 2022-05-12 NOTE — Progress Notes (Addendum)
ANTICOAGULATION CONSULT NOTE - Initial Consult  Pharmacy Consult for heparin Indication: pulmonary embolus w/ right heart strain  No Known Allergies  Patient Measurements: Height: '5\' 6"'$  (167.6 cm) Weight: 100 kg (220 lb 7.4 oz) IBW/kg (Calculated) : 59.3 Heparin Dosing Weight: 82 kg  Vital Signs: Temp: 97.5 F (36.4 C) (01/24 1006) Temp Source: Axillary (01/24 1006) BP: 110/66 (01/24 1830) Pulse Rate: 82 (01/24 1830)  Labs: Recent Labs    05/12/22 0421 05/12/22 0938 05/12/22 1200 05/12/22 1924  HGB 10.8*  --   --   --   HCT 33.8*  --   --   --   PLT 235  --   --   --   HEPARINUNFRC  --   --   --  >1.10*  CREATININE 0.69  --   --   --   TROPONINIHS  --  19* 637*  --      Estimated Creatinine Clearance: 79.2 mL/min (by C-G formula based on SCr of 0.69 mg/dL).   Medical History: Past Medical History:  Diagnosis Date   Brain tumor (benign) ([place])    Fibromyalgia    Neuropathy    Pancreatitis    PONV (postoperative nausea and vomiting)    RA (rheumatoid arthritis) ([place])    Zika virus disease 03/2014    Medications:  Facility-Administered Medications Prior to Admission  Medication Dose Route Frequency Provider Last Rate Last Admin   bupivacaine-meloxicam ER (ZYNRELEF) injection 400 mg  400 mg Infiltration Once Carole Civil, MD       Medications Prior to Admission  Medication Sig Dispense Refill Last Dose   acetaminophen (TYLENOL) 325 MG tablet Take 650 mg by mouth every 6 (six) hours as needed for moderate pain.   Past Month   Calcium-Vitamin D 600-200 MG-UNIT per tablet Take 2 tablets by mouth daily.   05/10/2022   Cholecalciferol (VITAMIN D3) 250 MCG (10000 UT) capsule Take 10,000 Units by mouth daily.   05/10/2022   CYMBALTA 60 MG capsule Take 60 mg by mouth daily.    05/11/2022 at 0530   Diclofenac Sodium 1 % CREA 4 gram qid as needed 742 g 11    folic acid (FOLVITE) 1 MG tablet Take 1 mg by mouth daily.   05/10/2022   gabapentin (NEURONTIN) 100 MG  capsule Take 1 capsule (100 mg total) by mouth 3 (three) times daily as needed. 90 capsule 11 05/10/2022   gabapentin (NEURONTIN) 300 MG capsule Take 2 capsules (600 mg total) by mouth at bedtime. (Patient taking differently: Take 300 mg by mouth at bedtime.) 180 capsule 0 05/10/2022   ibuprofen (ADVIL,MOTRIN) 200 MG tablet Take 600 mg by mouth every 6 (six) hours as needed for moderate pain.   Past Month   lactulose (CHRONULAC) 10 GM/15ML solution TAKE 15 ML BY MOUTH DAILY 473 mL 11 05/10/2022   lidocaine-prilocaine (EMLA) cream 4 gram qid as needed 30 g 11    meloxicam (MOBIC) 15 MG tablet Take 15 mg by mouth daily as needed for pain.   05/10/2022   methotrexate (RHEUMATREX) 2.5 MG tablet Take 15 mg by mouth every Sunday. Caution:Chemotherapy. Protect from light. Takes on Sundays.   Past Week   pantoprazole (PROTONIX) 40 MG tablet Take 1 tablet (40 mg total) by mouth daily. 30 tablet 11 05/11/2022 at 0530   polyvinyl alcohol (LIQUIFILM TEARS) 1.4 % ophthalmic solution Place 1 drop into both eyes as needed for dry eyes.      zinc gluconate 50 MG tablet  Take 50 mg by mouth daily.   05/10/2022   meloxicam (MOBIC) 7.5 MG tablet Take 1 tablet (7.5 mg total) by mouth daily. (Patient not taking: Reported on 03/22/2022) 30 tablet 1 Not Taking    Assessment: Pharmacy consulted to dose heparin in patient with pulmonary embolism w/ evidence of right heart strain..  Patient is not on anticoagulation prior to admission but received subq lovenox today at 0949.  Hgb 10.8  Heparin level came back supratherapeutic >1.1. It was drawn correctly on the opposite side. We will hold and decrease rate.  No bleeding per Rn.  Goal of Therapy:  Heparin level 0.3-0.7 units/ml Monitor platelets by anticoagulation protocol: Yes   Plan:  Hold heparin x1 hr Decrease rate to 1250 units/hr Check 6 hr HL  Onnie Boer, PharmD, [place], AAHIVP, CPP Infectious Disease Pharmacist 05/12/2022 7:52 PM

## 2022-05-12 NOTE — TOC Initial Note (Signed)
Transition of Care [facility]) - Initial/Assessment Note    Patient Details  Name: Whitney Moreno MRN: [id number] Date of Birth: [birth year]  Transition of Care [facility]) CM/SW Contact:    Iona Beard, [place] Phone Number: 05/12/2022, 10:22 AM  Clinical Narrative:                 [facility] consulted by Dr. Aline Brochure for [facility] services. CSW spoke to Kamrar with [place] who states that pts [facility] referral has been set up with them prior to pts surgery by Dr. Ruthe Mannan office. Marjory Lies also has needed [place] orders.   CSW updated that rapid response was called and pt has been transferred to the ICU at this time. TOC to follow for D/C needs.   Expected Discharge Plan: [place] Barriers to Discharge: Continued Medical Work up   Patient Goals and CMS Choice Patient states their goals for this hospitalization and ongoing recovery are:: get better CMS Medicare.gov Compare Post Acute Care list provided to:: Patient Choice offered to / list presented to : Patient      Expected Discharge Plan and Services In-house Referral: Clinical Social Work Discharge Planning Services: CM Consult   Living arrangements for the past 2 months: [place]: PT [place] Agency: [place] Date [place]: 05/12/22   Representative spoke with at [place]: Marjory Lies  Prior Living Arrangements/Services Living arrangements for the past 2 months: [place] Lives with:: Self Patient language and need for interpreter reviewed:: Yes Do you feel safe going back to the place where you live?: Yes      Need for Family Participation in Patient Care: Yes (Comment) Care giver support system in place?: Yes (comment)   Criminal Activity/Legal Involvement Pertinent to Current Situation/Hospitalization: No - Comment as needed  Activities of Daily Living Home Assistive Devices/Equipment: None ADL Screening (condition at time of  admission) Patient's cognitive ability adequate to safely complete daily activities?: Yes Is the patient deaf or have difficulty hearing?: No Does the patient have difficulty seeing, even when wearing glasses/contacts?: No Does the patient have difficulty concentrating, remembering, or making decisions?: No Patient able to express need for assistance with ADLs?: Yes Does the patient have difficulty dressing or bathing?: No Independently performs ADLs?: Yes (appropriate for developmental age) Does the patient have difficulty walking or climbing stairs?: No Weakness of Legs: Both Weakness of Arms/Hands: None  Permission Sought/Granted                  Emotional Assessment Appearance:: Appears stated age Attitude/Demeanor/Rapport: Engaged Affect (typically observed): Accepting   Alcohol / Substance Use: Not Applicable Psych Involvement: No (comment)  Admission diagnosis:  Primary osteoarthritis of right knee [M17.11] Patient Active Problem List   Diagnosis Date Noted   Cardiac arrest ([place]) 05/12/2022   Primary osteoarthritis of right knee 05/11/2022   Abdominal pain 01/11/2022   Diverticulosis of colon 01/11/2022   Elevated levels of transaminase & lactic acid dehydrogenase 01/11/2022   Flatulence, eructation and gas pain 01/11/2022   Imaging of gastrointestinal tract abnormal 01/11/2022   Obesity 01/11/2022   Neuropathy 07/03/2020   Neuropathic pain 07/03/2020   Colon cancer screening 12/05/2019   Anemia 12/05/2019   S/P splenectomy 06/06/2018   Status post left cataract extraction 01/01/2016   Constipation 08/12/2013   Rheumatoid arthritis (  [place]) 08/02/2013   Muscle weakness (generalized) 11/27/2010   Stiffness of joint, not elsewhere classified, lower leg 11/27/2010   Unilateral primary osteoarthritis, right knee 11/26/2010   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

## 2022-05-12 NOTE — Progress Notes (Signed)
  Echocardiogram 2D Echocardiogram has been performed.  Whitney Moreno 05/12/2022, 3:51 PM

## 2022-05-12 NOTE — Progress Notes (Signed)
Patient was walking in the hallway with PT when PT called out for assistance due to pt fainting back into wheelchair, pt was able to be placed in wheelchair, this nurse and charge nurse responded. Rapid response then called. Pt was seen to be staring off to the right, bilateral arms straight out and fists balled up, breathing rapid and loudly, not able to follow commends nor respond. Pt wheeled to room and transferred to bed by staff. No pulse nor breathing noted, CPR started, crash cart present, Rapid response team present.

## 2022-05-12 NOTE — Progress Notes (Signed)
Transported patient to CT Scan on full support and 100% FIO2.  Tolerated trip well.

## 2022-05-12 NOTE — Progress Notes (Signed)
Date and time results received: 05/12/22 1255 (use smartphrase ".now" to insert current time)  Test: Troponin  Critical Value: 637  Name of Provider Notified: Dr Manuella Ghazi and Dr Elsworth Soho  Orders Received? Or Actions Taken?:  Patient already has heparin drip infusing which just recently started

## 2022-05-12 NOTE — Progress Notes (Signed)
Physical Therapy Treatment Patient Details Name: Whitney Moreno MRN: [id number] DOB: 03-20-1951 Today's Date: 05/12/2022   RIGHT KNEE ROM:  0 - 88 degrees AMBULATION DISTANCE: 35 feet using RW with Min guard/Min assist   History of Present Illness Whitney Moreno is a 72 y/o female, s/p Right TKA on 05/11/22 with the diagnosis of Osteoarthritis right knee.    PT Comments    Patient states no c/o dizziness and feels better than yesterday.  Patient demonstrates improvement for moving RLE during bed mobility, good return for completing exercises and limited for right knee flexion due to increased pain at end range.  Patient had increased endurance/distance for gait training with fair/good return for right heel to toe stepping without loss of balance until having syncopal episode and had to sit in wheelchair to be wheeled back to room with code blue initiated.  Patient had to be transferred to a higher level of care and will need new PT orders to resume therapy when medically stable.     Recommendations for follow up therapy are one component of a multi-disciplinary discharge planning process, led by the attending physician.  Recommendations may be updated based on patient status, additional functional criteria and insurance authorization.  Follow Up Recommendations  Home health PT     Assistance Recommended at Discharge Set up Supervision/Assistance  Patient can return home with the following A little help with walking and/or transfers;A little help with bathing/dressing/bathroom;Help with stairs or ramp for entrance;Assistance with cooking/housework   Equipment Recommendations  None recommended by PT    Recommendations for Other Services       Precautions / Restrictions Precautions Precautions: Fall Restrictions Weight Bearing Restrictions: Yes RLE Weight Bearing: Weight bearing as tolerated     Mobility  Bed Mobility Overal bed mobility: Modified Independent              General bed mobility comments: slightly increased time, good return for sitting up, scooting to EOB    Transfers Overall transfer level: Needs assistance Equipment used: Rolling walker (2 wheels) Transfers: Sit to/from Stand Sit to Stand: Supervision, Modified independent (Device/Increase time)           General transfer comment: increased RLE strength for completing sit to stands    Ambulation/Gait Ambulation/Gait assistance: Supervision, Min guard Gait Distance (Feet): 70 Feet Assistive device: Rolling walker (2 wheels) Gait Pattern/deviations: Decreased step length - right, Decreased step length - left, Decreased stance time - right, Decreased stride length, Antalgic Gait velocity: decreased     General Gait Details: increased endurance/distance for gait traning with fair/good return for right heel to toe stepping without loss of balance   Stairs             Wheelchair Mobility    Modified Rankin (Stroke Patients Only)       Balance Overall balance assessment: Needs assistance Sitting-balance support: Feet supported, No upper extremity supported Sitting balance-Leahy Scale: Good Sitting balance - Comments: seated at EOB   Standing balance support: During functional activity, Bilateral upper extremity supported Standing balance-Leahy Scale: Fair Standing balance comment: fair/good using RW                            Cognition Arousal/Alertness: Awake/alert Behavior During Therapy: WFL for tasks assessed/performed Overall Cognitive Status: Within Functional Limits for tasks assessed  Exercises Total Joint Exercises Ankle Circles/Pumps: Supine, 10 reps, Right, AROM, Strengthening Quad Sets: AROM, Strengthening, Right, 10 reps, Supine Short Arc Quad: AROM, Strengthening, AAROM, 10 reps, Supine, Right Heel Slides: AROM, AAROM, Strengthening, Right, 10 reps, Supine Goniometric ROM:  Right knee: 0 - 88 degrees    General Comments        Pertinent Vitals/Pain Pain Assessment Pain Assessment: Faces Faces Pain Scale: Hurts a little bit Pain Location: right knee with end range flexion Pain Descriptors / Indicators: Sore Pain Intervention(s): Limited activity within patient's tolerance, Monitored during session, Repositioned    Home Living                          Prior Function            PT Goals (current goals can now be found in the care plan section) Acute Rehab PT Goals Time For Goal Achievement: 05/19/22 Potential to Achieve Goals: Good    Frequency    BID      PT Plan Current plan remains appropriate    Co-evaluation              AM-PAC PT "6 Clicks" Mobility   Outcome Measure  Help needed turning from your back to your side while in a flat bed without using bedrails?: None Help needed moving from lying on your back to sitting on the side of a flat bed without using bedrails?: None Help needed moving to and from a bed to a chair (including a wheelchair)?: A Little Help needed standing up from a chair using your arms (e.g., wheelchair or bedside chair)?: A Little Help needed to walk in hospital room?: A Little Help needed climbing 3-5 steps with a railing? : A Lot 6 Click Score: 19    End of Session   Activity Tolerance: Patient tolerated treatment well;Patient limited by fatigue;Other (comment) (Patient limited due to syncopal episode) Patient left: in bed Nurse Communication: Mobility status PT Visit Diagnosis: Unsteadiness on feet (R26.81);Other abnormalities of gait and mobility (R26.89);Muscle weakness (generalized) (M62.81)     Time: 3159-4585 PT Time Calculation (min) (ACUTE ONLY): 25 min  Charges:  $Gait Training: 8-22 mins $Therapeutic Exercise: 8-22 mins                     9:28 AM, 05/12/22 Lonell Grandchild, MPT Physical Therapist with [facility] [phone] office [phone] mobile  phone

## 2022-05-12 NOTE — Progress Notes (Addendum)
ANTICOAGULATION CONSULT NOTE - Initial Consult  Pharmacy Consult for heparin Indication: pulmonary embolus w/ right heart strain  No Known Allergies  Patient Measurements: Height: '5\' 6"'$  (167.6 cm) Weight: 100 kg (220 lb 7.4 oz) IBW/kg (Calculated) : 59.3 Heparin Dosing Weight: 82 kg  Vital Signs: Temp: 97.5 F (36.4 C) (01/24 1006) Temp Source: Axillary (01/24 1006) BP: 94/64 (01/24 1121) Pulse Rate: 100 (01/24 1121)  Labs: Recent Labs    05/12/22 0421 05/12/22 0938  HGB 10.8*  --   HCT 33.8*  --   PLT 235  --   CREATININE 0.69  --   TROPONINIHS  --  19*    Estimated Creatinine Clearance: 79.2 mL/min (by C-G formula based on SCr of 0.69 mg/dL).   Medical History: Past Medical History:  Diagnosis Date   Brain tumor (benign) ([place])    Fibromyalgia    Neuropathy    Pancreatitis    PONV (postoperative nausea and vomiting)    RA (rheumatoid arthritis) ([place])    Zika virus disease 03/2014    Medications:  Facility-Administered Medications Prior to Admission  Medication Dose Route Frequency Provider Last Rate Last Admin   bupivacaine-meloxicam ER (ZYNRELEF) injection 400 mg  400 mg Infiltration Once Carole Civil, MD       Medications Prior to Admission  Medication Sig Dispense Refill Last Dose   acetaminophen (TYLENOL) 325 MG tablet Take 650 mg by mouth every 6 (six) hours as needed for moderate pain.   Past Month   Calcium-Vitamin D 600-200 MG-UNIT per tablet Take 2 tablets by mouth daily.   05/10/2022   Cholecalciferol (VITAMIN D3) 250 MCG (10000 UT) capsule Take 10,000 Units by mouth daily.   05/10/2022   CYMBALTA 60 MG capsule Take 60 mg by mouth daily.    05/11/2022 at 0530   Diclofenac Sodium 1 % CREA 4 gram qid as needed 026 g 11    folic acid (FOLVITE) 1 MG tablet Take 1 mg by mouth daily.   05/10/2022   gabapentin (NEURONTIN) 100 MG capsule Take 1 capsule (100 mg total) by mouth 3 (three) times daily as needed. 90 capsule 11 05/10/2022   gabapentin  (NEURONTIN) 300 MG capsule Take 2 capsules (600 mg total) by mouth at bedtime. (Patient taking differently: Take 300 mg by mouth at bedtime.) 180 capsule 0 05/10/2022   ibuprofen (ADVIL,MOTRIN) 200 MG tablet Take 600 mg by mouth every 6 (six) hours as needed for moderate pain.   Past Month   lactulose (CHRONULAC) 10 GM/15ML solution TAKE 15 ML BY MOUTH DAILY 473 mL 11 05/10/2022   lidocaine-prilocaine (EMLA) cream 4 gram qid as needed 30 g 11    meloxicam (MOBIC) 15 MG tablet Take 15 mg by mouth daily as needed for pain.   05/10/2022   methotrexate (RHEUMATREX) 2.5 MG tablet Take 15 mg by mouth every Sunday. Caution:Chemotherapy. Protect from light. Takes on Sundays.   Past Week   pantoprazole (PROTONIX) 40 MG tablet Take 1 tablet (40 mg total) by mouth daily. 30 tablet 11 05/11/2022 at 0530   polyvinyl alcohol (LIQUIFILM TEARS) 1.4 % ophthalmic solution Place 1 drop into both eyes as needed for dry eyes.      zinc gluconate 50 MG tablet Take 50 mg by mouth daily.   05/10/2022   meloxicam (MOBIC) 7.5 MG tablet Take 1 tablet (7.5 mg total) by mouth daily. (Patient not taking: Reported on 03/22/2022) 30 tablet 1 Not Taking    Assessment: Pharmacy consulted to dose heparin  in patient with pulmonary embolism w/ evidence of right heart strain..  Patient is not on anticoagulation prior to admission but received subq lovenox today at 0949.  Hgb 10.8  Goal of Therapy:  Heparin level 0.3-0.7 units/ml Monitor platelets by anticoagulation protocol: Yes   Plan:  Give 5500 units bolus x 1 Start heparin infusion at 1450 units/hr Check anti-Xa level in 6-8 hours and daily while on heparin Continue to monitor H&H and platelets  Margot Ables, PharmD Clinical Pharmacist 05/12/2022 11:40 AM

## 2022-05-12 NOTE — Progress Notes (Signed)
Heard overhead page for rapid response to patient's room. When I entered room patient, physical therapist, and a few nurses in room. Patient was in recliner and was non responsive. We lifted patient into bed and began CPR. Dr. Aline Brochure was paged by nurse Loree Fee and he returned called. See CPR record for additional details and meds given. Patient intubated and transferred to ICU. Sister was present with patient at this time. Interpreter ipad used for translation for sister. Donavan Foil attempted to call patient's family.
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
## 2022-05-12 NOTE — Progress Notes (Signed)
Subjective: 1 Day Post-Op Procedure(s) (LRB): TOTAL KNEE ARTHROPLASTY (Right) Patient reports pain as 0 on 0-10 scale.    Objective: Vital signs in last 24 hours: Temp:  [97.2 F (36.2 C)-98.2 F (36.8 C)] 98.2 F (36.8 C) (01/24 0441) Pulse Rate:  [67-88] 80 (01/24 0441) Resp:  [10-18] 18 (01/24 0441) BP: (116-163)/(71-103) 130/78 (01/24 0441) SpO2:  [90 %-100 %] 96 % (01/24 0441)  Intake/Output from previous day: 01/23 0701 - 01/24 0700 In: 2145 [P.O.:240; I.V.:1353.1; IV Piggyback:551.9] Out: 1475 [Urine:1375; Blood:100] Intake/Output this shift: No intake/output data recorded.  Recent Labs    05/12/22 0421  HGB 10.8*   Recent Labs    05/12/22 0421  WBC 13.1*  RBC 3.53*  HCT 33.8*  PLT 235   Recent Labs    05/12/22 0421  NA 137  K 4.0  CL 106  CO2 24  BUN 16  CREATININE 0.69  GLUCOSE 146*  CALCIUM 8.1*   No results for input(s): "LABPT", "INR" in the last 72 hours.  Neurologically intact Neurovascular intact Sensation intact distally Intact pulses distally Dorsiflexion/Plantar flexion intact Compartment soft   Assessment/Plan: 1 Day Post-Op Procedure(s) (LRB): TOTAL KNEE ARTHROPLASTY (Right) Advance diet Up with therapy IV fluid bolus 500 cc , c/o dizzness yesterday  Also doesn't want to take hydocodone  Discharge pending response to bolus and PT eval today    Whitney Moreno 05/12/2022, 7:26 AM
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
## 2022-05-13 DIAGNOSIS — I469 Cardiac arrest, cause unspecified: Secondary | ICD-10-CM | POA: Diagnosis not present

## 2022-05-13 DIAGNOSIS — I2699 Other pulmonary embolism without acute cor pulmonale: Secondary | ICD-10-CM

## 2022-05-13 DIAGNOSIS — I2609 Other pulmonary embolism with acute cor pulmonale: Secondary | ICD-10-CM | POA: Diagnosis not present

## 2022-05-13 LAB — BASIC METABOLIC PANEL
Anion gap: 8 (ref 5–15)
BUN: 17 mg/dL (ref 8–23)
CO2: 24 mmol/L (ref 22–32)
Calcium: 8.1 mg/dL — ABNORMAL LOW (ref 8.9–10.3)
Chloride: 106 mmol/L (ref 98–111)
Creatinine, Ser: 0.84 mg/dL (ref 0.44–1.00)
GFR, Estimated: >60 mL/min (ref >60)
Glucose, Bld: 131 mg/dL — ABNORMAL HIGH (ref 70–99)
Potassium: 4.2 mmol/L (ref 3.5–5.1)
Sodium: 138 mmol/L (ref 135–145)

## 2022-05-13 LAB — RETICULOCYTES
Immature Retic Fract: 14.9 % (ref 2.3–15.9)
RBC.: 3.19 MIL/uL — ABNORMAL LOW (ref 3.87–5.11)
Retic Count, Absolute: 41.8 10*3/uL (ref 19.0–186.0)
Retic Ct Pct: 1.3 % (ref 0.4–3.1)

## 2022-05-13 LAB — VITAMIN B12: Vitamin B-12: 530 pg/mL (ref 180–914)

## 2022-05-13 LAB — IRON AND TIBC
Iron: 33 ug/dL (ref 28–170)
Saturation Ratios: 11 % (ref 10.4–31.8)
TIBC: 303 ug/dL (ref 250–450)
UIBC: 270 ug/dL

## 2022-05-13 LAB — FOLATE: Folate: 22.6 ng/mL (ref >5.9)

## 2022-05-13 LAB — MAGNESIUM: Magnesium: 2.1 mg/dL (ref 1.7–2.4)

## 2022-05-13 LAB — CBC
HCT: 32.2 % — ABNORMAL LOW (ref 36.0–46.0)
Hemoglobin: 10.5 g/dL — ABNORMAL LOW (ref 12.0–15.0)
MCH: 31 pg (ref 26.0–34.0)
MCHC: 32.6 g/dL (ref 30.0–36.0)
MCV: 95 fL (ref 80.0–100.0)
Platelets: 187 10*3/uL (ref 150–400)
RBC: 3.39 MIL/uL — ABNORMAL LOW (ref 3.87–5.11)
RDW: 14.3 % (ref 11.5–15.5)
WBC: 13.4 10*3/uL — ABNORMAL HIGH (ref 4.0–10.5)
nRBC: 0 % (ref 0.0–0.2)

## 2022-05-13 LAB — HEPARIN LEVEL (UNFRACTIONATED)
Heparin Unfractionated: >1.1 IU/mL — ABNORMAL HIGH (ref 0.30–0.70)
Heparin Unfractionated: 0.82 IU/mL — ABNORMAL HIGH (ref 0.30–0.70)
Heparin Unfractionated: 0.32 IU/mL (ref 0.30–0.70)

## 2022-05-13 LAB — FERRITIN: Ferritin: 58 ng/mL (ref 11–307)

## 2022-05-13 MED ORDER — HEPARIN (PORCINE) 25000 UT/250ML-% IV SOLN
1100.0000 [IU]/h | INTRAVENOUS | Status: AC
  Administered 2022-05-13 – 2022-05-14 (×2): 1050 [IU]/h via INTRAVENOUS
  Administered 2022-05-15: 1100 [IU]/h via INTRAVENOUS
  Filled 2022-05-13 (×2): qty 250

## 2022-05-13 NOTE — Progress Notes (Signed)
PROGRESS NOTE    Whitney Moreno  IOM:[id number] DOB: 07/18/1950 DOA: 05/11/2022 PCP: Asencion Noble, MD   Brief Narrative:    Whitney Moreno is a 72 y.o. female with medical history significant for osteoarthritis of right knee status post total knee arthroplasty 1/23, rheumatoid arthritis, peripheral neuropathy, fibromyalgia, obesity, and GERD.  She had a cardiac arrest the following day of her procedure and underwent CPR with ROSC and was intubated.  She was noted to have a submassive PE that appears to be responding well to IV heparin drip.  She remains hemodynamically stable and will need ongoing monitoring in the intensive care unit with heparin drip.  Pulmonology following.  Assessment & Plan:   Principal Problem:   Cardiac arrest [facility]) Active Problems:   Unilateral primary osteoarthritis, right knee   Rheumatoid arthritis (HCC)   Anemia   Neuropathy   Obesity   Primary osteoarthritis of right knee  Assessment and Plan:   Status postcardiac arrest secondary to submassive PE The echocardiogram with RV strain noted, preserved LVEF Patient has remained hemodynamically stable and therefore does not require any further transfer to [facility] for IR evaluation Continue IV heparin drip as recommended per pulmonology for another 48 hours and limit mobilization until 24 hours later Continue to wean oxygen requirements  Elevated troponin Secondary to above with noted RV strain No signs of ACS noted   Right knee osteoarthritis status post TKA 1/23 Being followed by orthopedics-Dr. Aline Brochure   Anemia-stable Hemoglobin currently 10.8 from 13.8 few days prior, likely postoperative blood loss Continue to follow and check anemia panel No overt bleeding currently noted   Fibromyalgia Resumed Cymbalta   Peripheral neuropathy Resumed gabapentin   Rheumatoid arthritis Hold home medications for now   GERD PPI   Obesity BMI 35.58   DVT prophylaxis: Heparin drip Code Status:  Full Family Communication: Sister at bedside 1/25 Disposition Plan:  Status is: Inpatient Remains inpatient appropriate because: Need for IV infusion and close monitoring.   Consultants:  PCCM Orthopedics  Procedures:  Right TKA 1/23 CPR and intubation 1/24  Antimicrobials:  None   Subjective: Patient seen and evaluated today with no new acute complaints or concerns. No acute concerns or events noted overnight.  She was extubated 1/24.  Objective: Vitals:   05/13/22 0700 05/13/22 0730 05/13/22 0800 05/13/22 0900  BP: (!) 112/55  (!) 113/58 107/62  Pulse: 74 77 85 81  Resp: 20 19 (!) 23 18  Temp:  98 F (36.7 C)    TempSrc:  Oral    SpO2: 99% 98% 98% 97%  Weight:      Height:        Intake/Output Summary (Last 24 hours) at 05/13/2022 0956 Last data filed at 05/13/2022 0428 Gross per 24 hour  Intake 1942.63 ml  Output 101 ml  Net 1841.63 ml   Filed Weights   05/11/22 0648 05/12/22 0914  Weight: 94.4 kg 100 kg    Examination:  General exam: Appears calm and comfortable  Respiratory system: Clear to auscultation. Respiratory effort normal.  Currently on 2 L nasal cannula Cardiovascular system: S1 & S2 heard, RRR.  Gastrointestinal system: Abdomen is soft Central nervous system: Alert and awake Extremities: Right knee with dressing clean dry and intact Skin: No significant lesions noted Psychiatry: Flat affect.    Data Reviewed: I have personally reviewed following labs and imaging studies  CBC: Recent Labs  Lab 05/07/22 1438 05/12/22 0421 05/13/22 0252  WBC 8.1 13.1* 13.4*  NEUTROABS  4.7  --   --   HGB 13.8 10.8* 10.5*  HCT 42.5 33.8* 32.2*  MCV 93.6 95.8 95.0  PLT 306 235 122   Basic Metabolic Panel: Recent Labs  Lab 05/07/22 1438 05/12/22 0421 05/13/22 0252  NA 137 137 138  K 4.1 4.0 4.2  CL 102 106 106  CO2 '26 24 24  '$ GLUCOSE 117* 146* 131*  BUN '19 16 17  '$ CREATININE 0.87 0.69 0.84  CALCIUM 9.9 8.1* 8.1*  MG  --   --  2.1    GFR: Estimated Creatinine Clearance: 75.4 mL/min (by C-G formula based on SCr of 0.84 mg/dL). Liver Function Tests: No results for input(s): "AST", "ALT", "ALKPHOS", "BILITOT", "PROT", "ALBUMIN" in the last 168 hours. No results for input(s): "LIPASE", "AMYLASE" in the last 168 hours. No results for input(s): "AMMONIA" in the last 168 hours. Coagulation Profile: No results for input(s): "INR", "PROTIME" in the last 168 hours. Cardiac Enzymes: No results for input(s): "CKTOTAL", "CKMB", "CKMBINDEX", "TROPONINI" in the last 168 hours. BNP (last 3 results) No results for input(s): "PROBNP" in the last 8760 hours. HbA1C: No results for input(s): "HGBA1C" in the last 72 hours. CBG: Recent Labs  Lab 05/12/22 1138  GLUCAP 172*   Lipid Profile: No results for input(s): "CHOL", "HDL", "LDLCALC", "TRIG", "CHOLHDL", "LDLDIRECT" in the last 72 hours. Thyroid Function Tests: No results for input(s): "TSH", "T4TOTAL", "FREET4", "T3FREE", "THYROIDAB" in the last 72 hours. Anemia Panel: No results for input(s): "VITAMINB12", "FOLATE", "FERRITIN", "TIBC", "IRON", "RETICCTPCT" in the last 72 hours. Sepsis Labs: No results for input(s): "PROCALCITON", "LATICACIDVEN" in the last 168 hours.  Recent Results (from the past 240 hour(s))  Surgical pcr screen     Status: None   Collection Time: 05/07/22  2:38 PM   Specimen: Nasal Mucosa; Nasal Swab  Result Value Ref Range Status   MRSA, PCR NEGATIVE NEGATIVE Final   Staphylococcus aureus NEGATIVE NEGATIVE Final    Comment: (NOTE) The Xpert SA Assay (FDA approved for NASAL specimens in patients 67 years of age and older), is one component of a comprehensive surveillance program. It is not intended to diagnose infection nor to guide or monitor treatment. Performed at [facility], [address], [place], [place] [address]   MRSA Next Gen by PCR, Nasal     Status: None   Collection Time: 05/12/22  9:30 AM   Specimen: Nasal Mucosa; Nasal Swab   Result Value Ref Range Status   MRSA by PCR Next Gen NOT DETECTED NOT DETECTED Final    Comment: (NOTE) The GeneXpert MRSA Assay (FDA approved for NASAL specimens only), is one component of a comprehensive MRSA colonization surveillance program. It is not intended to diagnose MRSA infection nor to guide or monitor treatment for MRSA infections. Test performance is not FDA approved in patients less than 70 years old. Performed at [facility], [address], [place], [place] [address]          Radiology Studies: ECHOCARDIOGRAM COMPLETE  Result Date: 05/12/2022    ECHOCARDIOGRAM REPORT   Patient Name:   MELIDA NORTHINGTON Date of Exam: 05/12/2022 Medical Rec #:  [id number]       Height:       66.0 in Accession #:    [id number]      Weight:       220.5 lb Date of Birth:  [birth year]       BSA:          2.084 m Patient Age:  71 years        BP:           116/69 mmHg Patient Gender: F               HR:           84 bpm. Exam Location:  Inpatient Procedure: 2D Echo, Cardiac Doppler, Color Doppler and Intracardiac            Opacification Agent Indications:    Pulmonary Embolus I26.09  History:        Patient has no prior history of Echocardiogram examinations.                 Arrythmias:Cardiac Arrest; Risk Factors:Former Smoker.  Sonographer:    Greer Pickerel Referring Phys: [id number] Addison D [facility]  Sonographer Comments: Technically difficult study due to poor echo windows, suboptimal subcostal window, suboptimal apical window and suboptimal parasternal window. Image acquisition challenging due to respiratory motion. IMPRESSIONS  1. Left ventricular ejection fraction, by estimation, is 55 to 60%. The left ventricle has normal function. Left ventricular endocardial border not optimally defined to evaluate regional wall motion. Left ventricular diastolic parameters are consistent with Grade I diastolic dysfunction (impaired relaxation). There is the interventricular septum is flattened in diastole ('D' shaped  left ventricle), consistent with right ventricular volume overload.  2. Cannot exclude thormbus at RV apex in setting of Definity contrast - could be off axis imaging of trabeculae as this moves with cardiac cycle. Patient with known acute DVT and pulmonary embolus (currently anticoagulated per chart). Right ventricular systolic function is moderately reduced. The right ventricular size is mildly enlarged. There is mildly elevated pulmonary artery systolic pressure. The estimated right ventricular systolic pressure is 19.3 mmHg.  3. The mitral valve is grossly normal. Trivial mitral valve regurgitation.  4. The aortic valve is tricuspid. Aortic valve regurgitation is not visualized.  5. The inferior vena cava is dilated in size with <50% respiratory variability, suggesting right atrial pressure of 15 mmHg. Comparison(s): No prior Echocardiogram. FINDINGS  Left Ventricle: Left ventricular ejection fraction, by estimation, is 55 to 60%. The left ventricle has normal function. Left ventricular endocardial border not optimally defined to evaluate regional wall motion. Definity contrast agent was given IV to delineate the left ventricular endocardial borders. The left ventricular internal cavity size was normal in size. There is borderline left ventricular hypertrophy. The interventricular septum is flattened in diastole ('D' shaped left ventricle), consistent with right ventricular volume overload. Left ventricular diastolic parameters are consistent with Grade I diastolic dysfunction (impaired relaxation). Right Ventricle: Cannot exclude thormbus at RV apex in setting of Definity contrast - could be off axis imaging of trabeculae as this moves with cardiac cycle. Patient with known acute DVT and pulmonary embolus (currently anticoagulated per chart). The right ventricular size is mildly enlarged. No increase in right ventricular wall thickness. Right ventricular systolic function is moderately reduced. There is mildly  elevated pulmonary artery systolic pressure. The tricuspid regurgitant velocity is 2.44  m/s, and with an assumed right atrial pressure of 15 mmHg, the estimated right ventricular systolic pressure is 79.0 mmHg. Left Atrium: Left atrial size was normal in size. Right Atrium: Right atrial size was normal in size. Pericardium: There is no evidence of pericardial effusion. Mitral Valve: The mitral valve is grossly normal. Trivial mitral valve regurgitation. Tricuspid Valve: The tricuspid valve is grossly normal. Tricuspid valve regurgitation is trivial. Aortic Valve: The aortic valve is tricuspid. Aortic valve regurgitation is not visualized. Pulmonic  Valve: The pulmonic valve was not well visualized. Pulmonic valve regurgitation is trivial. Aorta: The aortic root is normal in size and structure. Venous: The inferior vena cava is dilated in size with less than 50% respiratory variability, suggesting right atrial pressure of 15 mmHg. IAS/Shunts: The interatrial septum was not well visualized.  LEFT VENTRICLE PLAX 2D LVIDd:         4.10 cm   Diastology LVIDs:         2.10 cm   LV e' medial:    3.11 cm/s LV PW:         1.10 cm   LV E/e' medial:  17.6 LV IVS:        0.70 cm   LV e' lateral:   6.53 cm/s LVOT diam:     1.70 cm   LV E/e' lateral: 8.4 LV SV:         18 LV SV Index:   9 LVOT Area:     2.27 cm  RIGHT VENTRICLE RV S prime:     9.01 cm/s TAPSE (M-mode): 1.8 cm LEFT ATRIUM           Index LA diam:      2.80 cm 1.34 cm/m LA Vol (A4C): 35.9 ml 17.22 ml/m  AORTIC VALVE LVOT Vmax:   58.60 cm/s LVOT Vmean:  41.100 cm/s LVOT VTI:    0.079 m  AORTA Ao Root diam: 3.45 cm Ao Asc diam:  3.50 cm MITRAL VALVE               TRICUSPID VALVE MV Area (PHT): 3.60 cm    TR Peak grad:   23.8 mmHg MV Decel Time: 211 msec    TR Vmax:        244.00 cm/s MV E velocity: 54.80 cm/s MV A velocity: 72.00 cm/s  SHUNTS MV E/A ratio:  0.76        Systemic VTI:  0.08 m                            Systemic Diam: 1.70 cm Rozann Lesches MD  Electronically signed by Rozann Lesches MD Signature Date/Time: 05/12/2022/4:16:54 PM    Final    US Venous Img Lower Bilateral (DVT)  Result Date: 05/12/2022 CLINICAL DATA:  Pulmonary embolism. Evaluate for lower extremity DVT. EXAM: BILATERAL LOWER EXTREMITY VENOUS DOPPLER ULTRASOUND TECHNIQUE: Gray-scale sonography with graded compression, as well as color Doppler and duplex ultrasound were performed to evaluate the lower extremity deep venous systems from the level of the common femoral vein and including the common femoral, femoral, profunda femoral, popliteal and calf veins including the posterior tibial, peroneal and gastrocnemius veins when visible. The superficial great saphenous vein was also interrogated. Spectral Doppler was utilized to evaluate flow at rest and with distal augmentation maneuvers in the common femoral, femoral and popliteal veins. COMPARISON:  None Available. FINDINGS: RIGHT LOWER EXTREMITY Common Femoral Vein: No evidence of thrombus. Normal compressibility, respiratory phasicity and response to augmentation. Saphenofemoral Junction: No evidence of thrombus. Normal compressibility and flow on color Doppler imaging. Profunda Femoral Vein: No evidence of thrombus. Normal compressibility and flow on color Doppler imaging. Femoral Vein: No evidence of thrombus. Normal compressibility, respiratory phasicity and response to augmentation. Popliteal Vein: No evidence of thrombus. Normal compressibility, respiratory phasicity and response to augmentation. Calf Veins: Positive for deep venous thrombosis. One of the paired posterior tibial veins is not compressible and has no color Doppler flow. There  is also a non compressible peroneal vein. Superficial Great Saphenous Vein: No evidence of thrombus. Normal compressibility. Other Findings:  None. LEFT LOWER EXTREMITY Common Femoral Vein: No evidence of thrombus. Normal compressibility, respiratory phasicity and response to augmentation.  Saphenofemoral Junction: No evidence of thrombus. Normal compressibility and flow on color Doppler imaging. Profunda Femoral Vein: No evidence of thrombus. Normal compressibility and flow on color Doppler imaging. Femoral Vein: No evidence of thrombus. Normal compressibility, respiratory phasicity and response to augmentation. Popliteal Vein: No evidence of thrombus. Normal compressibility, respiratory phasicity and response to augmentation. Calf Veins: No evidence of thrombus. Normal compressibility and flow on color Doppler imaging. Superficial Great Saphenous Vein: No evidence of thrombus. Normal compressibility. Other Findings:  None. IMPRESSION: 1. Positive for deep venous thrombosis in the right lower extremity. Thrombus involving right posterior tibial and right peroneal veins. 2.  Negative for deep venous thrombosis in left lower extremity. Electronically Signed   By: Markus Daft M.D.   On: 05/12/2022 13:20   CT Angio Chest Pulmonary Embolism (PE) W or WO Contrast  Result Date: 05/12/2022 CLINICAL DATA:  Pulmonary embolism suspected. High probability. Cardiopulmonary arrest today. EXAM: CT ANGIOGRAPHY CHEST WITH CONTRAST TECHNIQUE: Multidetector CT imaging of the chest was performed using the standard protocol during bolus administration of intravenous contrast. Multiplanar CT image reconstructions and MIPs were obtained to evaluate the vascular anatomy. RADIATION DOSE REDUCTION: This exam was performed according to the departmental dose-optimization program which includes automated exposure control, adjustment of the mA and/or kV according to patient size and/or use of iterative reconstruction technique. CONTRAST:  26m OMNIPAQUE IOHEXOL 350 MG/ML SOLN COMPARISON:  Chest radiography same day FINDINGS: Cardiovascular: Heart size is normal. Right ventricular to left ventricular ratio is 1.6, consistent with right heart strain. There are multiple bilateral pulmonary emboli. Minimal aortic atherosclerotic  calcification is noted. No visible coronary artery calcification. Mediastinum/Nodes: No mass or adenopathy. Lungs/Pleura: Dependent pulmonary atelectasis left more than right. Upper Abdomen: Negative Musculoskeletal: Ordinary spinal degenerative changes. Review of the MIP images confirms the above findings. IMPRESSION: Positive for acute PE with CT evidence of right heart strain (RV/LV Ratio = 1.6 ) consistent with at least submassive (intermediate risk) PE. The presence of right heart strain has been associated with an increased risk of morbidity and mortality. Please refer to the "PE Focused" order set in EPIC. Call report in progress. Electronically Signed   By: MNelson ChimesM.D.   On: 05/12/2022 11:18   DG CHEST PORT 1 VIEW  Result Date: 05/12/2022 CLINICAL DATA:  72year old female enteric tube placement, intubated. EXAM: PORTABLE CHEST 1 VIEW COMPARISON:  Portable chest 08/12/2013. FINDINGS: Portable AP semi upright view at 0918 hours. Pacer or resuscitation pads project over the abdomen upper quadrant. Endotracheal tube in good position with tip between the clavicles and carina. Enteric tube placed into the stomach, loops in the gastric fundus. Moderate gastric air. Similar low lung volumes. Stable cardiac size and mediastinal contours. Chronic tortuous descending thoracic aorta. Allowing for portable technique the lungs are clear. No pneumothorax or pleural effusion. No acute osseous abnormality identified. IMPRESSION: 1. Satisfactory ET tube and enteric tube. 2. Moderate gas distended stomach, recommend NG tube to suction. 3.  No acute cardiopulmonary abnormality. Electronically Signed   By: HGenevie AnnM.D.   On: 05/12/2022 09:47   DG Knee Right Port  Result Date: 05/11/2022 CLINICAL DATA:  Status post knee replacement EXAM: PORTABLE RIGHT KNEE - 2 VIEW COMPARISON:  08/17/2021 FINDINGS: Status post right total knee  arthroplasty. No periprosthetic lucency or fracture. Near anatomic alignment of the  prosthetic components. Air within the soft tissues is not unexpected postoperatively. Superficial skin staples. IMPRESSION: Status post right total knee arthroplasty without complicating features. Electronically Signed   By: Merilyn Baba M.D.   On: 05/11/2022 11:14        Scheduled Meds:  aspirin EC  325 mg Oral Q breakfast   Chlorhexidine Gluconate Cloth  6 each Topical Daily   docusate sodium  100 mg Oral BID   DULoxetine  60 mg Oral Daily   folic acid  1 mg Oral Daily   lactulose  10 g Oral Daily   pantoprazole  40 mg Oral Daily   Continuous Infusions:  heparin 1,050 Units/hr (05/13/22 0428)     LOS: 1 day    Time spent: 35 minutes    Grenda Lora D Manuella Ghazi, DO [facility]  If 7PM-7AM, please contact night-coverage [email or web address] 05/13/2022, 9:56 AM

## 2022-05-13 NOTE — Progress Notes (Signed)
NAME:  Whitney Moreno, MRN:  [id number], DOB:  08/01/1950, LOS: 1 ADMISSION DATE:  05/11/2022, CONSULTATION DATE:  05/13/2022  REFERRING MD:  Lanice Shirts, CHIEF COMPLAINT: Cardiac arrest  History of Present Illness:  72 year old woman who underwent right knee arthroplasty on 1/23 for severe osteoarthritis.  She was being ambulated by physical therapy when she had a syncopal episode, had to sit on wheelchair was wheeled back to the room and CODE BLUE initiated.  ROSC was obtained within 4 minutes, she was not protecting her airway, hence intubated by ED physician. CT angiogram chest showed acute multiple bilateral pulmonary emboli with RV/LV ratio 1.6. Venous duplex showed DVT in right lower extremity, right posterior tibial and right peroneal veins   Pertinent  Medical History  Rheumatoid arthritis Peripheral neuropathy Fibromyalgia  Significant Hospital Events: Including procedures, antibiotic start and stop dates in addition to other pertinent events   1/24 extubated  Interim History / Subjective:   Has done well since extubation On 2 L nasal cannula Complains of rib pain  Objective   Blood pressure (!) 96/47, pulse 71, temperature 98.3 F (36.8 C), temperature source Oral, resp. rate 18, height '5\' 6"'$  (1.676 m), weight 100 kg, last menstrual period 02/16/2008, SpO2 99 %.        Intake/Output Summary (Last 24 hours) at 05/13/2022 1254 Last data filed at 05/13/2022 1035 Gross per 24 hour  Intake 2006.88 ml  Output 101 ml  Net 1905.88 ml    Filed Weights   05/11/22 0648 05/12/22 0914  Weight: 94.4 kg 100 kg    Examination: General: Elderly woman, lying in bed, no distress HENT: No pallor, icterus, no JVD Lungs: Clear to auscultation, no accessory muscle use Cardiovascular: S1-S2 regular Abdomen: Soft, nontender Extremities: 1+ edema, no deformity Neuro: Alert, interactive, nonfocal   Electrolytes normal, mild leukocytosis, slight drop in hemoglobin from 13.8-10.8,  increased troponin to 637 EKG showed sinus tachycardia with RBBB which is new compared to previous EKG from 2015 Echo shows evidence of RV strain  Resolved Hospital Problem list   Acute respiratory failure  Assessment & Plan:  Cardiac arrest  Acute bilateral pulmonary emboli with RV strain Right lower extremity DVT  -Risk factor obviously right TKR  -IV heparin x 48 more hours before switching to DOAC -she is not a candidate for lytics due to recent surgery.  Emboli appear to be distal pulmonary arteries but IR feels she  would be a candidate for catheter aspiration should she have any further hemodynamic deterioration.  Currently maintaining blood pressure and tachycardia has resolved, do not feel she needs this procedure as she continues to improve. -Bed to chair next 24 hours and can mobilize tomorrow  PCCM will be available as needed  Best Practice (right click and "Reselect all SmartList Selections" daily)   Diet/type: clear liquids DVT prophylaxis: systemic heparin GI prophylaxis: N/A Lines: N/A Foley:  N/A Code Status:  full code Last date of multidisciplinary goals of care discussion [NA]  Labs   CBC: Recent Labs  Lab 05/07/22 1438 05/12/22 0421 05/13/22 0252  WBC 8.1 13.1* 13.4*  NEUTROABS 4.7  --   --   HGB 13.8 10.8* 10.5*  HCT 42.5 33.8* 32.2*  MCV 93.6 95.8 95.0  PLT 306 235 187     Basic Metabolic Panel: Recent Labs  Lab 05/07/22 1438 05/12/22 0421 05/13/22 0252  NA 137 137 138  K 4.1 4.0 4.2  CL 102 106 106  CO2 '26 24 24  '$ GLUCOSE  117* 146* 131*  BUN '19 16 17  '$ CREATININE 0.87 0.69 0.84  CALCIUM 9.9 8.1* 8.1*  MG  --   --  2.1    GFR: Estimated Creatinine Clearance: 75.4 mL/min (by C-G formula based on SCr of 0.84 mg/dL). Recent Labs  Lab 05/07/22 1438 05/12/22 0421 05/13/22 0252  WBC 8.1 13.1* 13.4*     Liver Function Tests: No results for input(s): "AST", "ALT", "ALKPHOS", "BILITOT", "PROT", "ALBUMIN" in the last 168  hours. No results for input(s): "LIPASE", "AMYLASE" in the last 168 hours. No results for input(s): "AMMONIA" in the last 168 hours.  ABG    Component Value Date/Time   PHART 7.24 (L) 05/12/2022 0938   PCO2ART 51 (H) 05/12/2022 0938   PO2ART 229 (H) 05/12/2022 0938   HCO3 21.9 05/12/2022 0938   ACIDBASEDEF 5.9 (H) 05/12/2022 0938   O2SAT 100 05/12/2022 0938     Coagulation Profile: No results for input(s): "INR", "PROTIME" in the last 168 hours.  Cardiac Enzymes: No results for input(s): "CKTOTAL", "CKMB", "CKMBINDEX", "TROPONINI" in the last 168 hours.  HbA1C: Hgb A1c MFr Bld  Date/Time Value Ref Range Status  07/03/2020 10:42 AM 5.9 (H) 4.8 - 5.6 % Final    Comment:             Prediabetes: 5.7 - 6.4          Diabetes: >6.4          Glycemic control for adults with diabetes: <7.0   06/06/2018 10:39 AM 6.0 (H) 4.8 - 5.6 % Final    Comment:             Prediabetes: 5.7 - 6.4          Diabetes: >6.4          Glycemic control for adults with diabetes: <7.0     CBG: Recent Labs  Lab 05/12/22 Henderson MD. Shade Flood. [facility] Pager : [phone]  If no response to pager , please call [phone] until 7 pm After 7:00 pm call Elink  [phone]   05/13/2022

## 2022-05-13 NOTE — Progress Notes (Addendum)
ANTICOAGULATION CONSULT NOTE -   Pharmacy Consult for heparin Indication: pulmonary embolus w/ right heart strain/DVT  No Known Allergies  Patient Measurements: Height: '5\' 6"'$  (167.6 cm) Weight: 100 kg (220 lb 7.4 oz) IBW/kg (Calculated) : 59.3 Heparin Dosing Weight: 82 kg  Vital Signs: Temp: 98.3 F (36.8 C) (01/25 1235) Temp Source: Oral (01/25 1235) BP: 96/47 (01/25 1000) Pulse Rate: 71 (01/25 1001)  Labs: Recent Labs    05/12/22 0421 05/12/22 0938 05/12/22 1200 05/12/22 1924 05/13/22 0252 05/13/22 1131  HGB 10.8*  --   --   --  10.5*  --   HCT 33.8*  --   --   --  32.2*  --   PLT 235  --   --   --  187  --   HEPARINUNFRC  --   --   --  >1.10* >1.10* 0.82*  CREATININE 0.69  --   --   --  0.84  --   TROPONINIHS  --  19* 637*  --   --   --      Estimated Creatinine Clearance: 75.4 mL/min (by C-G formula based on SCr of 0.84 mg/dL).   Medical History: Past Medical History:  Diagnosis Date   Brain tumor (benign) ([place])    Fibromyalgia    Neuropathy    Pancreatitis    PONV (postoperative nausea and vomiting)    RA (rheumatoid arthritis) ([place])    Zika virus disease 03/2014    Medications:  Facility-Administered Medications Prior to Admission  Medication Dose Route Frequency Provider Last Rate Last Admin   bupivacaine-meloxicam ER (ZYNRELEF) injection 400 mg  400 mg Infiltration Once Carole Civil, MD       Medications Prior to Admission  Medication Sig Dispense Refill Last Dose   acetaminophen (TYLENOL) 325 MG tablet Take 650 mg by mouth every 6 (six) hours as needed for moderate pain.   Past Month   Calcium-Vitamin D 600-200 MG-UNIT per tablet Take 2 tablets by mouth daily.   05/10/2022   Cholecalciferol (VITAMIN D3) 250 MCG (10000 UT) capsule Take 10,000 Units by mouth daily.   05/10/2022   CYMBALTA 60 MG capsule Take 60 mg by mouth daily.    05/11/2022 at 0530   Diclofenac Sodium 1 % CREA 4 gram qid as needed 947 g 11    folic acid (FOLVITE) 1 MG  tablet Take 1 mg by mouth daily.   05/10/2022   gabapentin (NEURONTIN) 100 MG capsule Take 1 capsule (100 mg total) by mouth 3 (three) times daily as needed. 90 capsule 11 05/10/2022   gabapentin (NEURONTIN) 300 MG capsule Take 2 capsules (600 mg total) by mouth at bedtime. (Patient taking differently: Take 300 mg by mouth at bedtime.) 180 capsule 0 05/10/2022   ibuprofen (ADVIL,MOTRIN) 200 MG tablet Take 600 mg by mouth every 6 (six) hours as needed for moderate pain.   Past Month   lactulose (CHRONULAC) 10 GM/15ML solution TAKE 15 ML BY MOUTH DAILY 473 mL 11 05/10/2022   lidocaine-prilocaine (EMLA) cream 4 gram qid as needed 30 g 11    meloxicam (MOBIC) 15 MG tablet Take 15 mg by mouth daily as needed for pain.   05/10/2022   methotrexate (RHEUMATREX) 2.5 MG tablet Take 15 mg by mouth every Sunday. Caution:Chemotherapy. Protect from light. Takes on Sundays.   Past Week   pantoprazole (PROTONIX) 40 MG tablet Take 1 tablet (40 mg total) by mouth daily. 30 tablet 11 05/11/2022 at 0530   polyvinyl alcohol (  LIQUIFILM TEARS) 1.4 % ophthalmic solution Place 1 drop into both eyes as needed for dry eyes.      zinc gluconate 50 MG tablet Take 50 mg by mouth daily.   05/10/2022   meloxicam (MOBIC) 7.5 MG tablet Take 1 tablet (7.5 mg total) by mouth daily. (Patient not taking: Reported on 03/22/2022) 30 tablet 1 Not Taking    Assessment: Pharmacy consulted to dose heparin in patient with pulmonary embolism w/ evidence of right heart strain and DVT.  Patient is not on anticoagulation prior to admission.  HL 0.82- slightly supratherapeutic Hgb 10.5  Goal of Therapy:  Heparin level 0.3-0.7 units/ml Monitor platelets by anticoagulation protocol: Yes   Plan:  Decrease heparin infusion to 900 units/hr Check 6 hr HL  Margot Ables, PharmD Clinical Pharmacist 05/13/2022 12:39 PM

## 2022-05-13 NOTE — Progress Notes (Signed)
Per verbal order from Dr Elsworth Soho, ok to have patient sit on side of bed with feet dangling if patient wants and transfer to bedside commode right next to the bed if needed.

## 2022-05-13 NOTE — Progress Notes (Signed)
ANTICOAGULATION CONSULT NOTE   Pharmacy Consult for heparin Indication: pulmonary embolus w/ right heart strain  No Known Allergies  Patient Measurements: Height: '5\' 6"'$  (167.6 cm) Weight: 100 kg (220 lb 7.4 oz) IBW/kg (Calculated) : 59.3 Heparin Dosing Weight: 82 kg  Vital Signs: Temp: 98 F (36.7 C) (01/25 0020) Temp Source: Oral (01/25 0020) BP: 128/72 (01/25 0100) Pulse Rate: 73 (01/25 0100)  Labs: Recent Labs    05/12/22 0421 05/12/22 0938 05/12/22 1200 05/12/22 1924 05/13/22 0252  HGB 10.8*  --   --   --  10.5*  HCT 33.8*  --   --   --  32.2*  PLT 235  --   --   --  187  HEPARINUNFRC  --   --   --  >1.10* >1.10*  CREATININE 0.69  --   --   --   --   TROPONINIHS  --  19* 637*  --   --      Estimated Creatinine Clearance: 79.2 mL/min (by C-G formula based on SCr of 0.69 mg/dL).   Medical History: Past Medical History:  Diagnosis Date   Brain tumor (benign) ([place])    Fibromyalgia    Neuropathy    Pancreatitis    PONV (postoperative nausea and vomiting)    RA (rheumatoid arthritis) ([place])    Zika virus disease 03/2014    Medications:  Facility-Administered Medications Prior to Admission  Medication Dose Route Frequency Provider Last Rate Last Admin   bupivacaine-meloxicam ER (ZYNRELEF) injection 400 mg  400 mg Infiltration Once Carole Civil, MD       Medications Prior to Admission  Medication Sig Dispense Refill Last Dose   acetaminophen (TYLENOL) 325 MG tablet Take 650 mg by mouth every 6 (six) hours as needed for moderate pain.   Past Month   Calcium-Vitamin D 600-200 MG-UNIT per tablet Take 2 tablets by mouth daily.   05/10/2022   Cholecalciferol (VITAMIN D3) 250 MCG (10000 UT) capsule Take 10,000 Units by mouth daily.   05/10/2022   CYMBALTA 60 MG capsule Take 60 mg by mouth daily.    05/11/2022 at 0530   Diclofenac Sodium 1 % CREA 4 gram qid as needed 254 g 11    folic acid (FOLVITE) 1 MG tablet Take 1 mg by mouth daily.   05/10/2022    gabapentin (NEURONTIN) 100 MG capsule Take 1 capsule (100 mg total) by mouth 3 (three) times daily as needed. 90 capsule 11 05/10/2022   gabapentin (NEURONTIN) 300 MG capsule Take 2 capsules (600 mg total) by mouth at bedtime. (Patient taking differently: Take 300 mg by mouth at bedtime.) 180 capsule 0 05/10/2022   ibuprofen (ADVIL,MOTRIN) 200 MG tablet Take 600 mg by mouth every 6 (six) hours as needed for moderate pain.   Past Month   lactulose (CHRONULAC) 10 GM/15ML solution TAKE 15 ML BY MOUTH DAILY 473 mL 11 05/10/2022   lidocaine-prilocaine (EMLA) cream 4 gram qid as needed 30 g 11    meloxicam (MOBIC) 15 MG tablet Take 15 mg by mouth daily as needed for pain.   05/10/2022   methotrexate (RHEUMATREX) 2.5 MG tablet Take 15 mg by mouth every Sunday. Caution:Chemotherapy. Protect from light. Takes on Sundays.   Past Week   pantoprazole (PROTONIX) 40 MG tablet Take 1 tablet (40 mg total) by mouth daily. 30 tablet 11 05/11/2022 at 0530   polyvinyl alcohol (LIQUIFILM TEARS) 1.4 % ophthalmic solution Place 1 drop into both eyes as needed for dry eyes.  zinc gluconate 50 MG tablet Take 50 mg by mouth daily.   05/10/2022   meloxicam (MOBIC) 7.5 MG tablet Take 1 tablet (7.5 mg total) by mouth daily. (Patient not taking: Reported on 03/22/2022) 30 tablet 1 Not Taking    Assessment: Pharmacy consulted to dose heparin in patient with pulmonary embolism w/ evidence of right heart strain..  Patient is not on anticoagulation prior to admission but received subq lovenox today at 0949.  Hgb 10.8  1/25 AM update:  Heparin level remains elevated  Goal of Therapy:  Heparin level 0.3-0.7 units/ml Monitor platelets by anticoagulation protocol: Yes   Plan:  Hold heparin x 1 hr Decrease heparin to 1050 units/hr Heparin level in 8 hours  Narda Bonds, PharmD, [place] Pharmacist Phone: [phone]

## 2022-05-14 ENCOUNTER — Other Ambulatory Visit (HOSPITAL_COMMUNITY): Payer: Self-pay

## 2022-05-14 ENCOUNTER — Encounter (HOSPITAL_COMMUNITY): Payer: Medicare PPO

## 2022-05-14 DIAGNOSIS — I469 Cardiac arrest, cause unspecified: Secondary | ICD-10-CM | POA: Diagnosis not present

## 2022-05-14 LAB — CBC
HCT: 27.9 % — ABNORMAL LOW (ref 36.0–46.0)
Hemoglobin: 9.2 g/dL — ABNORMAL LOW (ref 12.0–15.0)
MCH: 31.2 pg (ref 26.0–34.0)
MCHC: 33 g/dL (ref 30.0–36.0)
MCV: 94.6 fL (ref 80.0–100.0)
Platelets: 207 10*3/uL (ref 150–400)
RBC: 2.95 MIL/uL — ABNORMAL LOW (ref 3.87–5.11)
RDW: 14.6 % (ref 11.5–15.5)
WBC: 12.8 10*3/uL — ABNORMAL HIGH (ref 4.0–10.5)
nRBC: 0.2 % (ref 0.0–0.2)

## 2022-05-14 LAB — HEPARIN LEVEL (UNFRACTIONATED)
Heparin Unfractionated: 0.27 IU/mL — ABNORMAL LOW (ref 0.30–0.70)
Heparin Unfractionated: 0.34 IU/mL (ref 0.30–0.70)

## 2022-05-14 LAB — BASIC METABOLIC PANEL
Anion gap: 7 (ref 5–15)
BUN: 17 mg/dL (ref 8–23)
CO2: 25 mmol/L (ref 22–32)
Calcium: 8.1 mg/dL — ABNORMAL LOW (ref 8.9–10.3)
Chloride: 106 mmol/L (ref 98–111)
Creatinine, Ser: 0.57 mg/dL (ref 0.44–1.00)
GFR, Estimated: >60 mL/min (ref >60)
Glucose, Bld: 104 mg/dL — ABNORMAL HIGH (ref 70–99)
Potassium: 3.8 mmol/L (ref 3.5–5.1)
Sodium: 138 mmol/L (ref 135–145)

## 2022-05-14 LAB — MAGNESIUM: Magnesium: 1.9 mg/dL (ref 1.7–2.4)

## 2022-05-14 MED ORDER — POLYETHYLENE GLYCOL 3350 17 G PO PACK
17.0000 g | PACK | Freq: Every day | ORAL | Status: DC
  Administered 2022-05-14 – 2022-05-16 (×3): 17 g via ORAL
  Filled 2022-05-14 (×3): qty 1

## 2022-05-14 MED ORDER — BISACODYL 5 MG PO TBEC: 5.0000 mg | DELAYED_RELEASE_TABLET | Freq: Every day | ORAL | Status: DC | PRN

## 2022-05-14 NOTE — Progress Notes (Signed)
Patient ID: Whitney Moreno, female   DOB: [birth year], 72 y.o.   MRN: [id number] Progress note for May 13, 2022  Postop day 2 status post right total knee complicated by pulmonary embolism  Patient's dressing is dry scant drainage.  Patient is awake alert and oriented x 3 mild shortness of breath.  Patient has O2 nasal cannula in place  Hemoglobin is maintained slightly above 10  Continue medical management will consider resumption of therapy when medically appropriate

## 2022-05-14 NOTE — Care Management Important Message (Signed)
Important Message  Patient Details  Name: Whitney Moreno MRN: [id number] Date of Birth: Sep 03, 1950   Medicare Important Message Given:  Yes     Tommy Medal 05/14/2022, 11:39 AM

## 2022-05-14 NOTE — TOC Progression Note (Addendum)
Transition of Care [facility]) - Progression Note    Patient Details  Name: Whitney Moreno MRN: [id number] Date of Birth: [birth year]  [facility] CM/SW Contact  Shade Flood, LCSW Phone Number: 05/14/2022, 10:51 AM  Clinical Narrative:     TOC following. PT now recommending SNF rehab at dc. Met with pt at bedside to review. Pt agreeable to rehab. CMS provider options reviewed and will refer as requested.   MD anticipating possible dc Sunday. TOC will start insurance auth today.  Will follow.  1401: Updated pt on bed offers and she selects [facility]. Updated Kerri who states that if pt has auth, they can admit on Sunday. Weekend TOC will follow.  Expected Discharge Plan: [place] Barriers to Discharge: Continued Medical Work up  Expected Discharge Plan and Services In-house Referral: Clinical Social Work Discharge Planning Services: CM Consult Post Acute Care Choice: [place] Living arrangements for the past 2 months: [place]: PT [place]: [place] Date [place]: 05/12/22   Representative spoke with at [place]: [place] Determinants of Health (SDOH) Interventions SDOH Screenings   Food Insecurity: No Food Insecurity (05/11/2022)  Housing: Low Risk  (05/11/2022)  Transportation Needs: No Transportation Needs (05/11/2022)  Utilities: Not At Risk (05/11/2022)  Depression (PHQ2-9): Low Risk  (01/25/2022)  Tobacco Use: Medium Risk (05/12/2022)    Readmission Risk Interventions     No data to display

## 2022-05-14 NOTE — Progress Notes (Addendum)
ANTICOAGULATION CONSULT NOTE -   Pharmacy Consult for heparin Indication: pulmonary embolus w/ right heart strain/DVT  No Known Allergies  Patient Measurements: Height: '5\' 6"'$  (167.6 cm) Weight: 101 kg (222 lb 10.6 oz) IBW/kg (Calculated) : 59.3 Heparin Dosing Weight: 82 kg  Vital Signs: Temp: 98.4 F (36.9 C) (01/26 1200) Temp Source: Oral (01/26 1200) BP: 136/74 (01/26 1000) Pulse Rate: 79 (01/26 1111)  Labs: Recent Labs    05/12/22 0421 05/12/22 0938 05/12/22 1200 05/12/22 1924 05/13/22 0252 05/13/22 1131 05/13/22 2050 05/14/22 0421 05/14/22 1404  HGB 10.8*  --   --   --  10.5*  --   --  9.2*  --   HCT 33.8*  --   --   --  32.2*  --   --  27.9*  --   PLT 235  --   --   --  187  --   --  207  --   HEPARINUNFRC  --   --   --    < > >1.10*   < > 0.32 0.27* 0.34  CREATININE 0.69  --   --   --  0.84  --   --  0.57  --   TROPONINIHS  --  19* 637*  --   --   --   --   --   --    < > = values in this interval not displayed.    Estimated Creatinine Clearance: 79.6 mL/min (by C-G formula based on SCr of 0.57 mg/dL).   Medical History: Past Medical History:  Diagnosis Date   Brain tumor (benign) ([place])    Fibromyalgia    Neuropathy    Pancreatitis    PONV (postoperative nausea and vomiting)    RA (rheumatoid arthritis) ([place])    Zika virus disease 03/2014   Assessment: Pharmacy consulted to dose heparin in patient with pulmonary embolism w/ evidence of right heart strain and DVT.  Patient is not on anticoagulation prior to admission.  Heparin level 0.34, now at goal after rate adjustment this am.  Hgb down 10.5>9.2, no bleeding or IV issues noted.   Plan to switch to DOAC this weekend.   Goal of Therapy:  Heparin level 0.3-0.7 units/ml Monitor platelets by anticoagulation protocol: Yes   Plan:  Increase heparin to 1100 units/hr to keep within range Recheck heparin level in am  Erin Hearing PharmD., BCPS Clinical Pharmacist 05/14/2022 3:00 PM

## 2022-05-14 NOTE — Progress Notes (Signed)
Patient ID: Whitney Moreno, female   DOB: 08-Apr-1951, 72 y.o.   MRN: [id number]  Postop day 3 status post right total knee complicated by DVT and PE a  Currently on heparin  Patient complains of burning sensation medial side right and some rib pain  The knee is swollen there are signs of infection.  There is a burning sensation on the medial side of the knee which will be addressed with ice packs for 30 minutes.  The patient  She has not had  Urinating fine.  No other musculoskeletal complaints at this time.  Patient mental status is normal     Latest Ref Rng & Units 05/14/2022    4:21 AM 05/13/2022    2:52 AM 05/12/2022    4:21 AM  CBC  WBC 4.0 - 10.5 K/uL 12.8  13.4  13.1   Hemoglobin 12.0 - 15.0 g/dL 9.2  10.5  10.8   Hematocrit 36.0 - 46.0 % 27.9  32.2  33.8   Platelets 150 - 400 K/uL 207  187  235       Latest Ref Rng & Units 05/14/2022    4:21 AM 05/13/2022    2:52 AM 05/12/2022    4:21 AM  BMP  Glucose 70 - 99 mg/dL 104  131  146   BUN 8 - 23 mg/dL '17  17  16   '$ Creatinine 0.44 - 1.00 mg/dL 0.57  0.84  0.69   Sodium 135 - 145 mmol/L 138  138  137   Potassium 3.5 - 5.1 mmol/L 3.8  4.2  4.0   Chloride 98 - 111 mmol/L 106  106  106   CO2 22 - 32 mmol/L '25  24  24   '$ Calcium 8.9 - 10.3 mg/dL 8.1  8.1  8.1    Plan continue medical management with heparin drip Ice packs to right knee Medication to induce BM

## 2022-05-14 NOTE — Progress Notes (Signed)
PROGRESS NOTE    Whitney Moreno  POE:[id number] DOB: 03-24-1951 DOA: 05/11/2022 PCP: Asencion Noble, MD   Brief Narrative:    Whitney Moreno is a 72 y.o. female with medical history significant for osteoarthritis of right knee status post total knee arthroplasty 1/23, rheumatoid arthritis, peripheral neuropathy, fibromyalgia, obesity, and GERD.  She had a cardiac arrest the following day of her procedure and underwent CPR with ROSC and was intubated.  She was noted to have a submassive PE that appears to be responding well to IV heparin drip.  She remains hemodynamically stable and will need ongoing monitoring in the intensive care unit with heparin drip.  Pulmonology following.  Assessment & Plan:   Principal Problem:   Cardiac arrest [facility]) Active Problems:   Unilateral primary osteoarthritis, right knee   Rheumatoid arthritis (HCC)   Anemia   Neuropathy   Obesity   Primary osteoarthritis of right knee   Pulmonary embolus (HCC)  Assessment and Plan:   Status postcardiac arrest secondary to submassive PE The echocardiogram with RV strain noted, preserved LVEF Patient has remained hemodynamically stable and therefore does not require any further transfer to [facility] for IR evaluation Continue IV heparin drip for another 24 hours as recommended per pulmonology Okay to mobilize with PT today Continue to wean oxygen requirements to room air Likely okay to transfer to telemetry later today  Elevated troponin Secondary to above with noted RV strain No signs of ACS noted   Right knee osteoarthritis status post TKA 1/23 Being followed by orthopedics-Dr. Aline Brochure   Anemia-stable Downward trending on lab work, but with no overt bleeding, continue to monitor, likely postoperative blood loss Continue to follow and check anemia panel No overt bleeding currently noted   Fibromyalgia Resumed Cymbalta   Peripheral neuropathy Resumed gabapentin   Rheumatoid arthritis Hold home  medications for now   GERD PPI   Obesity BMI 35.58   DVT prophylaxis: Heparin drip Code Status: Full Family Communication: Sister at bedside 1/26 Disposition Plan:  Status is: Inpatient Remains inpatient appropriate because: Need for IV infusion and close monitoring.   Consultants:  PCCM Orthopedics  Procedures:  Right TKA 1/23 CPR and intubation 1/24  Antimicrobials:  None   Subjective: Patient seen and evaluated today with no new acute complaints or concerns. No acute concerns or events noted overnight and remains on minimal amounts of nasal cannula  Objective: Vitals:   05/14/22 0700 05/14/22 0800 05/14/22 0900 05/14/22 1000  BP: 111/64 129/67 (!) 88/52 136/74  Pulse: 76 95 87 84  Resp: '16 19 17 '$ (!) 22  Temp:  98 F (36.7 C)    TempSrc:  Oral    SpO2: 98% 98% 99% 96%  Weight:      Height:        Intake/Output Summary (Last 24 hours) at 05/14/2022 1026 Last data filed at 05/14/2022 0303 Gross per 24 hour  Intake 208.49 ml  Output 1100 ml  Net -891.51 ml   Filed Weights   05/11/22 0648 05/12/22 0914 05/14/22 0451  Weight: 94.4 kg 100 kg 101 kg    Examination:  General exam: Appears calm and comfortable  Respiratory system: Clear to auscultation. Respiratory effort normal.  Currently on 2 L nasal cannula Cardiovascular system: S1 & S2 heard, RRR.  Gastrointestinal system: Abdomen is soft Central nervous system: Alert and awake Extremities: Right knee with dressing clean dry and intact Skin: No significant lesions noted Psychiatry: Flat affect.    Data Reviewed: I have  personally reviewed following labs and imaging studies  CBC: Recent Labs  Lab 05/07/22 1438 05/12/22 0421 05/13/22 0252 05/14/22 0421  WBC 8.1 13.1* 13.4* 12.8*  NEUTROABS 4.7  --   --   --   HGB 13.8 10.8* 10.5* 9.2*  HCT 42.5 33.8* 32.2* 27.9*  MCV 93.6 95.8 95.0 94.6  PLT 306 235 187 202   Basic Metabolic Panel: Recent Labs  Lab 05/07/22 1438 05/12/22 0421  05/13/22 0252 05/14/22 0421  NA 137 137 138 138  K 4.1 4.0 4.2 3.8  CL 102 106 106 106  CO2 '26 24 24 25  '$ GLUCOSE 117* 146* 131* 104*  BUN '19 16 17 17  '$ CREATININE 0.87 0.69 0.84 0.57  CALCIUM 9.9 8.1* 8.1* 8.1*  MG  --   --  2.1 1.9   GFR: Estimated Creatinine Clearance: 79.6 mL/min (by C-G formula based on SCr of 0.57 mg/dL). Liver Function Tests: No results for input(s): "AST", "ALT", "ALKPHOS", "BILITOT", "PROT", "ALBUMIN" in the last 168 hours. No results for input(s): "LIPASE", "AMYLASE" in the last 168 hours. No results for input(s): "AMMONIA" in the last 168 hours. Coagulation Profile: No results for input(s): "INR", "PROTIME" in the last 168 hours. Cardiac Enzymes: No results for input(s): "CKTOTAL", "CKMB", "CKMBINDEX", "TROPONINI" in the last 168 hours. BNP (last 3 results) No results for input(s): "PROBNP" in the last 8760 hours. HbA1C: No results for input(s): "HGBA1C" in the last 72 hours. CBG: Recent Labs  Lab 05/12/22 1138  GLUCAP 172*   Lipid Profile: No results for input(s): "CHOL", "HDL", "LDLCALC", "TRIG", "CHOLHDL", "LDLDIRECT" in the last 72 hours. Thyroid Function Tests: No results for input(s): "TSH", "T4TOTAL", "FREET4", "T3FREE", "THYROIDAB" in the last 72 hours. Anemia Panel: Recent Labs    05/13/22 1058  VITAMINB12 530  FOLATE 22.6  FERRITIN 58  TIBC 303  IRON 33  RETICCTPCT 1.3   Sepsis Labs: No results for input(s): "PROCALCITON", "LATICACIDVEN" in the last 168 hours.  Recent Results (from the past 240 hour(s))  Surgical pcr screen     Status: None   Collection Time: 05/07/22  2:38 PM   Specimen: Nasal Mucosa; Nasal Swab  Result Value Ref Range Status   MRSA, PCR NEGATIVE NEGATIVE Final   Staphylococcus aureus NEGATIVE NEGATIVE Final    Comment: (NOTE) The Xpert SA Assay (FDA approved for NASAL specimens in patients 3 years of age and older), is one component of a comprehensive surveillance program. It is not intended to  diagnose infection nor to guide or monitor treatment. Performed at [facility], [address], [place], [place] [address]   MRSA Next Gen by PCR, Nasal     Status: None   Collection Time: 05/12/22  9:30 AM   Specimen: Nasal Mucosa; Nasal Swab  Result Value Ref Range Status   MRSA by PCR Next Gen NOT DETECTED NOT DETECTED Final    Comment: (NOTE) The GeneXpert MRSA Assay (FDA approved for NASAL specimens only), is one component of a comprehensive MRSA colonization surveillance program. It is not intended to diagnose MRSA infection nor to guide or monitor treatment for MRSA infections. Test performance is not FDA approved in patients less than 22 years old. Performed at [facility], [address] [place], [place] [address]          Radiology Studies: ECHOCARDIOGRAM COMPLETE  Result Date: 05/12/2022    ECHOCARDIOGRAM REPORT   Patient Name:   Whitney Moreno Date of Exam: 05/12/2022 Medical Rec #:  [id number]  Height:       66.0 in Accession #:    [id number]      Weight:       220.5 lb Date of Birth:  1950/12/12       BSA:          2.084 m Patient Age:    55 years        BP:           116/69 mmHg Patient Gender: F               HR:           84 bpm. Exam Location:  Inpatient Procedure: 2D Echo, Cardiac Doppler, Color Doppler and Intracardiac            Opacification Agent Indications:    Pulmonary Embolus I26.09  History:        Patient has no prior history of Echocardiogram examinations.                 Arrythmias:Cardiac Arrest; Risk Factors:Former Smoker.  Sonographer:    Greer Pickerel Referring Phys: [id number] [place] D [facility]  Sonographer Comments: Technically difficult study due to poor echo windows, suboptimal subcostal window, suboptimal apical window and suboptimal parasternal window. Image acquisition challenging due to respiratory motion. IMPRESSIONS  1. Left ventricular ejection fraction, by estimation, is 55 to 60%. The left ventricle has normal function. Left ventricular  endocardial border not optimally defined to evaluate regional wall motion. Left ventricular diastolic parameters are consistent with Grade I diastolic dysfunction (impaired relaxation). There is the interventricular septum is flattened in diastole ('D' shaped left ventricle), consistent with right ventricular volume overload.  2. Cannot exclude thormbus at RV apex in setting of Definity contrast - could be off axis imaging of trabeculae as this moves with cardiac cycle. Patient with known acute DVT and pulmonary embolus (currently anticoagulated per chart). Right ventricular systolic function is moderately reduced. The right ventricular size is mildly enlarged. There is mildly elevated pulmonary artery systolic pressure. The estimated right ventricular systolic pressure is 81.0 mmHg.  3. The mitral valve is grossly normal. Trivial mitral valve regurgitation.  4. The aortic valve is tricuspid. Aortic valve regurgitation is not visualized.  5. The inferior vena cava is dilated in size with <50% respiratory variability, suggesting right atrial pressure of 15 mmHg. Comparison(s): No prior Echocardiogram. FINDINGS  Left Ventricle: Left ventricular ejection fraction, by estimation, is 55 to 60%. The left ventricle has normal function. Left ventricular endocardial border not optimally defined to evaluate regional wall motion. Definity contrast agent was given IV to delineate the left ventricular endocardial borders. The left ventricular internal cavity size was normal in size. There is borderline left ventricular hypertrophy. The interventricular septum is flattened in diastole ('D' shaped left ventricle), consistent with right ventricular volume overload. Left ventricular diastolic parameters are consistent with Grade I diastolic dysfunction (impaired relaxation). Right Ventricle: Cannot exclude thormbus at RV apex in setting of Definity contrast - could be off axis imaging of trabeculae as this moves with cardiac cycle.  Patient with known acute DVT and pulmonary embolus (currently anticoagulated per chart). The right ventricular size is mildly enlarged. No increase in right ventricular wall thickness. Right ventricular systolic function is moderately reduced. There is mildly elevated pulmonary artery systolic pressure. The tricuspid regurgitant velocity is 2.44  m/s, and with an assumed right atrial pressure of 15 mmHg, the estimated right ventricular systolic pressure is 17.5 mmHg. Left Atrium: Left atrial size was normal in  size. Right Atrium: Right atrial size was normal in size. Pericardium: There is no evidence of pericardial effusion. Mitral Valve: The mitral valve is grossly normal. Trivial mitral valve regurgitation. Tricuspid Valve: The tricuspid valve is grossly normal. Tricuspid valve regurgitation is trivial. Aortic Valve: The aortic valve is tricuspid. Aortic valve regurgitation is not visualized. Pulmonic Valve: The pulmonic valve was not well visualized. Pulmonic valve regurgitation is trivial. Aorta: The aortic root is normal in size and structure. Venous: The inferior vena cava is dilated in size with less than 50% respiratory variability, suggesting right atrial pressure of 15 mmHg. IAS/Shunts: The interatrial septum was not well visualized.  LEFT VENTRICLE PLAX 2D LVIDd:         4.10 cm   Diastology LVIDs:         2.10 cm   LV e' medial:    3.11 cm/s LV PW:         1.10 cm   LV E/e' medial:  17.6 LV IVS:        0.70 cm   LV e' lateral:   6.53 cm/s LVOT diam:     1.70 cm   LV E/e' lateral: 8.4 LV SV:         18 LV SV Index:   9 LVOT Area:     2.27 cm  RIGHT VENTRICLE RV S prime:     9.01 cm/s TAPSE (M-mode): 1.8 cm LEFT ATRIUM           Index LA diam:      2.80 cm 1.34 cm/m LA Vol (A4C): 35.9 ml 17.22 ml/m  AORTIC VALVE LVOT Vmax:   58.60 cm/s LVOT Vmean:  41.100 cm/s LVOT VTI:    0.079 m  AORTA Ao Root diam: 3.45 cm Ao Asc diam:  3.50 cm MITRAL VALVE               TRICUSPID VALVE MV Area (PHT): 3.60 cm    TR  Peak grad:   23.8 mmHg MV Decel Time: 211 msec    TR Vmax:        244.00 cm/s MV E velocity: 54.80 cm/s MV A velocity: 72.00 cm/s  SHUNTS MV E/A ratio:  0.76        Systemic VTI:  0.08 m                            Systemic Diam: 1.70 cm Rozann Lesches MD Electronically signed by Rozann Lesches MD Signature Date/Time: 05/12/2022/4:16:54 PM    Final    US Venous Img Lower Bilateral (DVT)  Result Date: 05/12/2022 CLINICAL DATA:  Pulmonary embolism. Evaluate for lower extremity DVT. EXAM: BILATERAL LOWER EXTREMITY VENOUS DOPPLER ULTRASOUND TECHNIQUE: Gray-scale sonography with graded compression, as well as color Doppler and duplex ultrasound were performed to evaluate the lower extremity deep venous systems from the level of the common femoral vein and including the common femoral, femoral, profunda femoral, popliteal and calf veins including the posterior tibial, peroneal and gastrocnemius veins when visible. The superficial great saphenous vein was also interrogated. Spectral Doppler was utilized to evaluate flow at rest and with distal augmentation maneuvers in the common femoral, femoral and popliteal veins. COMPARISON:  None Available. FINDINGS: RIGHT LOWER EXTREMITY Common Femoral Vein: No evidence of thrombus. Normal compressibility, respiratory phasicity and response to augmentation. Saphenofemoral Junction: No evidence of thrombus. Normal compressibility and flow on color Doppler imaging. Profunda Femoral Vein: No evidence of thrombus. Normal compressibility and  flow on color Doppler imaging. Femoral Vein: No evidence of thrombus. Normal compressibility, respiratory phasicity and response to augmentation. Popliteal Vein: No evidence of thrombus. Normal compressibility, respiratory phasicity and response to augmentation. Calf Veins: Positive for deep venous thrombosis. One of the paired posterior tibial veins is not compressible and has no color Doppler flow. There is also a non compressible peroneal vein.  Superficial Great Saphenous Vein: No evidence of thrombus. Normal compressibility. Other Findings:  None. LEFT LOWER EXTREMITY Common Femoral Vein: No evidence of thrombus. Normal compressibility, respiratory phasicity and response to augmentation. Saphenofemoral Junction: No evidence of thrombus. Normal compressibility and flow on color Doppler imaging. Profunda Femoral Vein: No evidence of thrombus. Normal compressibility and flow on color Doppler imaging. Femoral Vein: No evidence of thrombus. Normal compressibility, respiratory phasicity and response to augmentation. Popliteal Vein: No evidence of thrombus. Normal compressibility, respiratory phasicity and response to augmentation. Calf Veins: No evidence of thrombus. Normal compressibility and flow on color Doppler imaging. Superficial Great Saphenous Vein: No evidence of thrombus. Normal compressibility. Other Findings:  None. IMPRESSION: 1. Positive for deep venous thrombosis in the right lower extremity. Thrombus involving right posterior tibial and right peroneal veins. 2.  Negative for deep venous thrombosis in left lower extremity. Electronically Signed   By: Markus Daft M.D.   On: 05/12/2022 13:20   CT Angio Chest Pulmonary Embolism (PE) W or WO Contrast  Result Date: 05/12/2022 CLINICAL DATA:  Pulmonary embolism suspected. High probability. Cardiopulmonary arrest today. EXAM: CT ANGIOGRAPHY CHEST WITH CONTRAST TECHNIQUE: Multidetector CT imaging of the chest was performed using the standard protocol during bolus administration of intravenous contrast. Multiplanar CT image reconstructions and MIPs were obtained to evaluate the vascular anatomy. RADIATION DOSE REDUCTION: This exam was performed according to the departmental dose-optimization program which includes automated exposure control, adjustment of the mA and/or kV according to patient size and/or use of iterative reconstruction technique. CONTRAST:  42m OMNIPAQUE IOHEXOL 350 MG/ML SOLN  COMPARISON:  Chest radiography same day FINDINGS: Cardiovascular: Heart size is normal. Right ventricular to left ventricular ratio is 1.6, consistent with right heart strain. There are multiple bilateral pulmonary emboli. Minimal aortic atherosclerotic calcification is noted. No visible coronary artery calcification. Mediastinum/Nodes: No mass or adenopathy. Lungs/Pleura: Dependent pulmonary atelectasis left more than right. Upper Abdomen: Negative Musculoskeletal: Ordinary spinal degenerative changes. Review of the MIP images confirms the above findings. IMPRESSION: Positive for acute PE with CT evidence of right heart strain (RV/LV Ratio = 1.6 ) consistent with at least submassive (intermediate risk) PE. The presence of right heart strain has been associated with an increased risk of morbidity and mortality. Please refer to the "PE Focused" order set in EPIC. Call report in progress. Electronically Signed   By: MNelson ChimesM.D.   On: 05/12/2022 11:18        Scheduled Meds:  aspirin EC  325 mg Oral Q breakfast   Chlorhexidine Gluconate Cloth  6 each Topical Daily   docusate sodium  100 mg Oral BID   DULoxetine  60 mg Oral Daily   folic acid  1 mg Oral Daily   lactulose  10 g Oral Daily   pantoprazole  40 mg Oral Daily   polyethylene glycol  17 g Oral Daily   Continuous Infusions:  heparin 1,050 Units/hr (05/14/22 0807)     LOS: 2 days    Time spent: 35 minutes    Karem Tomaso DDarleen Crocker DO [facility]  If 7PM-7AM, please contact night-coverage [email or web address] 05/14/2022, 10:26 AM

## 2022-05-14 NOTE — NC FL2 (Signed)
[place] LEVEL OF CARE FORM     IDENTIFICATION  Patient Name: Whitney Moreno Birthdate: [birth year] Sex: female Admission Date (Current Location): 05/11/2022  [facility] and [place] Number:  [employer] and Address:  [place] [address] [place]      Provider Number: [id number]  Attending Physician Name and Address:  Rodena Goldmann, DO  Relative Name and Phone Number:       Current Level of Care: Hospital Recommended Level of Care: [facility] Prior Approval Number:    Date Approved/Denied:   PASRR Number: [id number]  Discharge Plan: SNF    Current Diagnoses: Patient Active Problem List   Diagnosis Date Noted   Pulmonary embolus ([place]) 05/13/2022   Cardiac arrest ([place]) 05/12/2022   Primary osteoarthritis of right knee 05/11/2022   Abdominal pain 01/11/2022   Diverticulosis of colon 01/11/2022   Elevated levels of transaminase & lactic acid dehydrogenase 01/11/2022   Flatulence, eructation and gas pain 01/11/2022   Imaging of gastrointestinal tract abnormal 01/11/2022   Obesity 01/11/2022   Neuropathy 07/03/2020   Neuropathic pain 07/03/2020   Colon cancer screening 12/05/2019   Anemia 12/05/2019   S/P splenectomy 06/06/2018   Status post left cataract extraction 01/01/2016   Constipation 08/12/2013   Rheumatoid arthritis ([place]) 08/02/2013   Muscle weakness (generalized) 11/27/2010   Stiffness of joint, not elsewhere classified, lower leg 11/27/2010   Unilateral primary osteoarthritis, right knee 11/26/2010   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    Orientation RESPIRATION BLADDER Height & Weight     Self, Time, Situation, Place  O2 (see dc summary) Continent Weight: 222 lb 10.6 oz (101 kg) Height:  '5\' 6"'$  (167.6 cm)  BEHAVIORAL SYMPTOMS/MOOD NEUROLOGICAL BOWEL NUTRITION STATUS      Continent Diet (see dc summary)  AMBULATORY STATUS COMMUNICATION OF NEEDS Skin   Extensive Assist Verbally  Surgical wounds                       Personal Care Assistance Level of Assistance  Bathing, Feeding, Dressing Bathing Assistance: Limited assistance Feeding assistance: Independent Dressing Assistance: Limited assistance     Functional Limitations Info  Sight, Hearing, Speech Sight Info: Adequate Hearing Info: Adequate Speech Info: Adequate    SPECIAL CARE FACTORS FREQUENCY  PT (By licensed PT), OT (By licensed OT)     PT Frequency: 5x week OT Frequency: 3x week            Contractures Contractures Info: Not present    Additional Factors Info  Code Status, Allergies Code Status Info: Full Allergies Info: NKA           Current Medications (05/14/2022):  This is the current hospital active medication list Current Facility-Administered Medications  Medication Dose Route Frequency Provider Last Rate Last Admin   acetaminophen (TYLENOL) tablet 325-650 mg  325-650 mg Oral Q6H PRN Carole Civil, MD   650 mg at 05/14/22 0119   acetaminophen-codeine (TYLENOL #3) 300-30 MG per tablet 1-2 tablet  1-2 tablet Oral Q4H PRN Heath Lark D, DO   1 tablet at 05/14/22 0802   alum & mag hydroxide-simeth (MAALOX/MYLANTA) 200-200-20 MG/5ML suspension 30 mL  30 mL Oral Q4H PRN Heath Lark D, DO       aspirin EC tablet 325 mg  325 mg Oral Q breakfast Heath Lark D, DO   325 mg at 05/14/22 0802   bisacodyl (DULCOLAX) EC tablet 5 mg  5  mg Oral Daily PRN Carole Civil, MD       Chlorhexidine Gluconate Cloth 2 % PADS 6 each  6 each Topical Daily Heath Lark D, DO   6 each at 05/14/22 1028   docusate sodium (COLACE) capsule 100 mg  100 mg Oral BID Pham, Minh Q, RPH-CPP   100 mg at 05/14/22 1027   DULoxetine (CYMBALTA) DR capsule 60 mg  60 mg Oral Daily Heath Lark D, DO   60 mg at 20/35/59 7416   folic acid (FOLVITE) tablet 1 mg  1 mg Oral Daily Manuella Ghazi, Pratik D, DO   1 mg at 05/14/22 1027   gabapentin (NEURONTIN) capsule 100 mg  100 mg Oral TID PRN Heath Lark D, DO   100  mg at 05/14/22 0119   heparin ADULT infusion 100 units/mL (25000 units/214m)  1,050 Units/hr Intravenous Continuous WLyndee Leo RPH 10.5 mL/hr at 05/14/22 0807 1,050 Units/hr at 05/14/22 0807   lactulose (CHRONULAC) 10 GM/15ML solution 10 g  10 g Oral Daily SHeath LarkD, DO   10 g at 05/14/22 1027   metoCLOPramide (REGLAN) tablet 5-10 mg  5-10 mg Oral Q8H PRN HCarole Civil MD       Or   metoCLOPramide (REGLAN) injection 5-10 mg  5-10 mg Intravenous Q8H PRN HCarole Civil MD       morphine (PF) 2 MG/ML injection 0.5-1 mg  0.5-1 mg Intravenous Q2H PRN HCarole Civil MD       ondansetron ([facility] tablet 4 mg  4 mg Oral Q6H PRN HCarole Civil MD       Or   ondansetron ([facility] injection 4 mg  4 mg Intravenous Q6H PRN HCarole Civil MD       pantoprazole (PROTONIX) EC tablet 40 mg  40 mg Oral Daily SManuella Ghazi Pratik D, DO   40 mg at 05/14/22 1027   polyethylene glycol (MIRALAX / GLYCOLAX) packet 17 g  17 g Oral Daily HCarole Civil MD   17 g at 05/14/22 1027     Discharge Medications: Please see discharge summary for a list of discharge medications.  Relevant Imaging Results:  Relevant Lab Results:   Additional Information SSN: [id number] [place]

## 2022-05-14 NOTE — TOC Benefit Eligibility Note (Signed)
Patient Advocate Encounter  Insurance verification completed.    The patient is currently admitted and upon discharge could be taking Eliquis 5 mg.  The current 30 day co-pay is $40.00.   The patient is currently admitted and upon discharge could be taking Xarelto 20 mg.  The current 30 day co-pay is $40.00.   The patient is insured through [employer] Medicare Part D   Whitney Moreno, CPHT Pharmacy Patient Advocate Specialist [facility] Patient Advocate Team Direct Number: [phone]  Fax: [phone]       

## 2022-05-14 NOTE — Progress Notes (Signed)
ANTICOAGULATION CONSULT NOTE -   Pharmacy Consult for heparin Indication: pulmonary embolus w/ right heart strain/DVT  No Known Allergies  Patient Measurements: Height: '5\' 6"'$  (167.6 cm) Weight: 101 kg (222 lb 10.6 oz) IBW/kg (Calculated) : 59.3 Heparin Dosing Weight: 82 kg  Vital Signs: Temp: 98.1 F (36.7 C) (01/26 0451) Temp Source: Oral (01/26 0451) BP: 112/62 (01/26 0600) Pulse Rate: 78 (01/26 0600)  Labs: Recent Labs    05/12/22 0421 05/12/22 0938 05/12/22 1200 05/12/22 1924 05/13/22 0252 05/13/22 1131 05/13/22 2050 05/14/22 0421  HGB 10.8*  --   --   --  10.5*  --   --  9.2*  HCT 33.8*  --   --   --  32.2*  --   --  27.9*  PLT 235  --   --   --  187  --   --  207  HEPARINUNFRC  --   --   --    < > >1.10* 0.82* 0.32 0.27*  CREATININE 0.69  --   --   --  0.84  --   --  0.57  TROPONINIHS  --  19* 637*  --   --   --   --   --    < > = values in this interval not displayed.     Estimated Creatinine Clearance: 79.6 mL/min (by C-G formula based on SCr of 0.57 mg/dL).   Medical History: Past Medical History:  Diagnosis Date   Brain tumor (benign) ([place])    Fibromyalgia    Neuropathy    Pancreatitis    PONV (postoperative nausea and vomiting)    RA (rheumatoid arthritis) ([place])    Zika virus disease 03/2014   Assessment: Pharmacy consulted to dose heparin in patient with pulmonary embolism w/ evidence of right heart strain and DVT.  Patient is not on anticoagulation prior to admission.  HL 0.27- slightly subtherapeutic Hgb down 10.5>9.2, no bleeding or IV issues noted.   Plan to switch to DOAC this weekend.   Goal of Therapy:  Heparin level 0.3-0.7 units/ml Monitor platelets by anticoagulation protocol: Yes   Plan:  Increase heparin to 1050 units/hr Recheck heparin level this afternoon then daily  Erin Hearing PharmD., BCPS Clinical Pharmacist 05/14/2022 7:50 AM   Erin Hearing PharmD., BCPS Clinical Pharmacist 05/14/2022 7:49 AM

## 2022-05-14 NOTE — Evaluation (Signed)
Physical Therapy Evaluation Patient Details Name: Whitney Moreno MRN: [id number] DOB: 05-06-1950 Today's Date: 05/14/2022  RIGHT  KNEE ROM:  0 - 85 degrees AMBULATION DISTANCE:  5 feet using RW with Min/Mod assist   History of Present Illness  Whitney Moreno is a 72 y/o female, s/p Cardiac arrest on 05/12/22, s/p Right TKA on 05/11/22 with the diagnosis of Osteoarthritis right knee.   Clinical Impression  Patient demonstrates slow labored movement for sitting up at bedside requiring Min/mod assistance for moving RLE due to increased pain/weakness, limited to a few steps at bedside before having to sit due to c/o fatigue, chest discomfort and generalized weakness.  Patient tolerated sitting up in chair after therapy with RLE dangling and sister present in room.  Patient on room air with SpO2 at 95% - nurse notified.  Patient will benefit from continued skilled physical therapy in hospital and recommended venue below to increase strength, balance, endurance for safe ADLs and gait.          Recommendations for follow up therapy are one component of a multi-disciplinary discharge planning process, led by the attending physician.  Recommendations may be updated based on patient status, additional functional criteria and insurance authorization.  Follow Up Recommendations Skilled nursing-short term rehab (<3 hours/day) Can patient physically be transported by private vehicle: Yes    Assistance Recommended at Discharge Set up Supervision/Assistance  Patient can return home with the following  A little help with walking and/or transfers;A little help with bathing/dressing/bathroom;Help with stairs or ramp for entrance;Assistance with cooking/housework    Equipment Recommendations None recommended by PT  Recommendations for Other Services       Functional Status Assessment Patient has had a recent decline in their functional status and demonstrates the ability to make significant  improvements in function in a reasonable and predictable amount of time.     Precautions / Restrictions Precautions Precautions: Fall Restrictions Weight Bearing Restrictions: Yes RLE Weight Bearing: Weight bearing as tolerated      Mobility  Bed Mobility Overal bed mobility: Needs Assistance Bed Mobility: Supine to Sit     Supine to sit: Min assist, Mod assist     General bed mobility comments: increased time, labored movement, had difficulty moving RLE requiring Min/Mod assist    Transfers Overall transfer level: Needs assistance Equipment used: Rolling walker (2 wheels) Transfers: Sit to/from Stand, Bed to chair/wheelchair/BSC Sit to Stand: Min assist, Mod assist   Step pivot transfers: Min assist, Mod assist       General transfer comment: slow labored movement    Ambulation/Gait Ambulation/Gait assistance: Min assist, Mod assist Gait Distance (Feet): 5 Feet Assistive device: Rolling walker (2 wheels) Gait Pattern/deviations: Decreased step length - right, Decreased step length - left, Decreased stance time - right, Decreased stride length, Antalgic Gait velocity: decreased     General Gait Details: limited to a few steps at bedside mostly due to fatigue, mild SOB and generalized weakness  Stairs            Wheelchair Mobility    Modified Rankin (Stroke Patients Only)       Balance Overall balance assessment: Needs assistance Sitting-balance support: Feet supported, No upper extremity supported Sitting balance-Leahy Scale: Fair Sitting balance - Comments: fair/good seated at EOB   Standing balance support: During functional activity, Bilateral upper extremity supported Standing balance-Leahy Scale: Fair Standing balance comment: using RW  Pertinent Vitals/Pain Pain Assessment Pain Assessment: 0-10 Pain Score: 3  Pain Location: right knee with end range flexion Pain Descriptors / Indicators:  Sore Pain Intervention(s): Limited activity within patient's tolerance, Monitored during session, Repositioned    Home Living Family/patient expects to be discharged to:: Private residence Living Arrangements: Spouse/significant other Available Help at Discharge: Family;Available 24 hours/day Type of Home: House Home Access: Stairs to enter Entrance Stairs-Rails: Right;Left;Can reach both Entrance Stairs-Number of Steps: 6   Home Layout: One level Home Equipment: Conservation officer, nature (2 wheels);Shower seat;Grab bars - tub/shower      Prior Function Prior Level of Function : Independent/Modified Independent             Mobility Comments: Hydrographic surveyor, drives ADLs Comments: Independent     Hand Dominance   Dominant Hand: Right    Extremity/Trunk Assessment   Upper Extremity Assessment Upper Extremity Assessment: Generalized weakness    Lower Extremity Assessment Lower Extremity Assessment: Generalized weakness;RLE deficits/detail RLE Deficits / Details: grossly 4/5 RLE: Unable to fully assess due to pain RLE Sensation: WNL RLE Coordination: WNL    Cervical / Trunk Assessment Cervical / Trunk Assessment: Normal  Communication   Communication: No difficulties  Cognition Arousal/Alertness: Awake/alert Behavior During Therapy: WFL for tasks assessed/performed Overall Cognitive Status: Within Functional Limits for tasks assessed                                          General Comments      Exercises Total Joint Exercises Ankle Circles/Pumps: Supine, 10 reps, Right, AROM, Strengthening Quad Sets: AROM, Strengthening, Right, 10 reps, Supine Short Arc Quad: AROM, Strengthening, AAROM, 10 reps, Supine, Right Heel Slides: AROM, AAROM, Strengthening, Right, 10 reps, Supine Goniometric ROM: Right Knee: 0 - 85 degrees   Assessment/Plan    PT Assessment Patient needs continued PT services  PT Problem List Decreased strength;Decreased range of  motion;Decreased activity tolerance;Decreased balance;Decreased mobility       PT Treatment Interventions DME instruction;Gait training;Stair training;Functional mobility training;Therapeutic activities;Therapeutic exercise;Patient/family education;Balance training    PT Goals (Current goals can be found in the Care Plan section)  Acute Rehab PT Goals Patient Stated Goal: return home with family to assist PT Goal Formulation: With patient/family Time For Goal Achievement: 05/28/22 Potential to Achieve Goals: Good    Frequency Min 4X/week     Co-evaluation               AM-PAC PT "6 Clicks" Mobility  Outcome Measure Help needed turning from your back to your side while in a flat bed without using bedrails?: A Little Help needed moving from lying on your back to sitting on the side of a flat bed without using bedrails?: A Little Help needed moving to and from a bed to a chair (including a wheelchair)?: A Little Help needed standing up from a chair using your arms (e.g., wheelchair or bedside chair)?: A Little Help needed to walk in hospital room?: A Lot Help needed climbing 3-5 steps with a railing? : A Lot 6 Click Score: 16    End of Session   Activity Tolerance: Patient tolerated treatment well;Patient limited by fatigue Patient left: in chair;with call bell/phone within reach;with family/visitor present Nurse Communication: Mobility status PT Visit Diagnosis: Unsteadiness on feet (R26.81);Other abnormalities of gait and mobility (R26.89);Muscle weakness (generalized) (M62.81)    Time: 1027-2536 PT Time Calculation (min) (ACUTE ONLY):  29 min   Charges:   PT Evaluation $PT Eval Moderate Complexity: 1 Mod PT Treatments $Therapeutic Exercise: 23-37 mins        1:46 PM, 05/14/22 Lonell Grandchild, MPT Physical Therapist with [facility] [phone] office [phone] mobile phone

## 2022-05-15 DIAGNOSIS — I469 Cardiac arrest, cause unspecified: Secondary | ICD-10-CM | POA: Diagnosis not present

## 2022-05-15 LAB — CBC
HCT: 28.3 % — ABNORMAL LOW (ref 36.0–46.0)
Hemoglobin: 9.1 g/dL — ABNORMAL LOW (ref 12.0–15.0)
MCH: 30.7 pg (ref 26.0–34.0)
MCHC: 32.2 g/dL (ref 30.0–36.0)
MCV: 95.6 fL (ref 80.0–100.0)
Platelets: 230 10*3/uL (ref 150–400)
RBC: 2.96 MIL/uL — ABNORMAL LOW (ref 3.87–5.11)
RDW: 14.7 % (ref 11.5–15.5)
WBC: 10.7 10*3/uL — ABNORMAL HIGH (ref 4.0–10.5)
nRBC: 0.7 % — ABNORMAL HIGH (ref 0.0–0.2)

## 2022-05-15 LAB — HEPARIN LEVEL (UNFRACTIONATED): Heparin Unfractionated: 0.33 IU/mL (ref 0.30–0.70)

## 2022-05-15 MED ORDER — APIXABAN 5 MG PO TABS
10.0000 mg | ORAL_TABLET | Freq: Two times a day (BID) | ORAL | Status: DC
  Administered 2022-05-15 – 2022-05-16 (×3): 10 mg via ORAL
  Filled 2022-05-15 (×3): qty 2

## 2022-05-15 MED ORDER — ASPIRIN 81 MG PO TBEC
81.0000 mg | DELAYED_RELEASE_TABLET | Freq: Every day | ORAL | Status: DC
  Administered 2022-05-16: 81 mg via ORAL
  Filled 2022-05-15: qty 1

## 2022-05-15 MED ORDER — APIXABAN 5 MG PO TABS: 5.0000 mg | ORAL_TABLET | Freq: Two times a day (BID) | ORAL | Status: DC

## 2022-05-15 NOTE — Progress Notes (Signed)
Patient arrived to room 340 around 2206. VSS. She is on room air and oxygen saturations are 95%. Pt is alert and oriented x4. She has no complaints of pain. Interpreter in patient room d/t pt request she wants it for her sister. Sister verbalizes concerns that patient was using oxygen in ICU. This Probation officer explained to sister and patient that oxygen saturations are adequate on room air and we will continue to monitor and administer oxygen if needed while patient sleeps. ICU RN reported that patient wore oxygen during the last 2 nights d/t sats dropping. This writer applied pulse ox to tele monitor. Around 0100 it was noted that patient sats were 89-90% so this writer applied 2L [place] of supplemental oxygen. After oxygen applied sats up to 97-98%.

## 2022-05-15 NOTE — Progress Notes (Signed)
PROGRESS NOTE    Whitney Moreno  UXL:[id number] DOB: 04/25/1950 DOA: 05/11/2022 PCP: Asencion Noble, MD   Brief Narrative:    Whitney Moreno is a 72 y.o. female with medical history significant for osteoarthritis of right knee status post total knee arthroplasty 1/23, rheumatoid arthritis, peripheral neuropathy, fibromyalgia, obesity, and GERD.  She had a cardiac arrest the following day of her procedure and underwent CPR with ROSC and was intubated.  She was noted to have a submassive PE that appears to be responding well to IV heparin drip.  She remains hemodynamically stable and will need ongoing monitoring in the intensive care unit with heparin drip.  Pulmonology following.  Assessment & Plan:   Principal Problem:   Cardiac arrest [facility]) Active Problems:   Unilateral primary osteoarthritis, right knee   Rheumatoid arthritis (HCC)   Anemia   Neuropathy   Obesity   Primary osteoarthritis of right knee   Pulmonary embolus (H[facility])  Assessment and Plan:   Status postcardiac arrest secondary to submassive PE The echocardiogram with RV strain noted, preserved LVEF Patient has remained hemodynamically stable and therefore does not require any further transfer to [facility] for IR evaluation Discontinue IV heparin drip and start on Eliquis Okay to mobilize with PT today Continue to wean oxygen requirements to room air Likely transfer to SNF in the next 24 hours  Elevated troponin Secondary to above with noted RV strain No signs of ACS noted   Right knee osteoarthritis status post TKA 1/23 Being followed by orthopedics-Dr. Aline Brochure   Anemia-stable Downward trending on lab work, but with no overt bleeding, continue to monitor, likely postoperative blood loss Continue to follow and check anemia panel No overt bleeding currently noted   Fibromyalgia Resumed Cymbalta   Peripheral neuropathy Resumed gabapentin   Rheumatoid arthritis Hold home medications for now   GERD PPI    Obesity BMI 35.58   DVT prophylaxis: Heparin drip Code Status: Full Family Communication: Sister at bedside 1/27 Disposition Plan:  Status is: Inpatient Remains inpatient appropriate because: Need for IV infusion and close monitoring.   Consultants:  PCCM Orthopedics  Procedures:  Right TKA 1/23 CPR and intubation 1/24  Antimicrobials:  None   Subjective: Patient seen and evaluated today with no new acute complaints or concerns. No acute concerns or events noted overnight and remains on minimal amounts of nasal cannula  Objective: Vitals:   05/14/22 2100 05/14/22 2206 05/15/22 0140 05/15/22 0537  BP: 127/75 123/73 132/77 (!) 141/76  Pulse: 97 90 88 79  Resp: '20 20 18 20  '$ Temp:  98.4 F (36.9 C) 98.1 F (36.7 C) 98.4 F (36.9 C)  TempSrc:      SpO2: 100% 95% 96% 99%  Weight:      Height:        Intake/Output Summary (Last 24 hours) at 05/15/2022 1028 Last data filed at 05/15/2022 0500 Gross per 24 hour  Intake 240 ml  Output 200 ml  Net 40 ml   Filed Weights   05/11/22 0648 05/12/22 0914 05/14/22 0451  Weight: 94.4 kg 100 kg 101 kg    Examination:  General exam: Appears calm and comfortable  Respiratory system: Clear to auscultation. Respiratory effort normal.  Currently on 2 L nasal cannula Cardiovascular system: S1 & S2 heard, RRR.  Gastrointestinal system: Abdomen is soft Central nervous system: Alert and awake Extremities: Right knee with dressing clean dry and intact Skin: No significant lesions noted Psychiatry: Flat affect.    Data Reviewed:  I have personally reviewed following labs and imaging studies  CBC: Recent Labs  Lab 05/12/22 0421 05/13/22 0252 05/14/22 0421 05/15/22 0528  WBC 13.1* 13.4* 12.8* 10.7*  HGB 10.8* 10.5* 9.2* 9.1*  HCT 33.8* 32.2* 27.9* 28.3*  MCV 95.8 95.0 94.6 95.6  PLT 235 187 207 097   Basic Metabolic Panel: Recent Labs  Lab 05/12/22 0421 05/13/22 0252 05/14/22 0421  NA 137 138 138  K 4.0 4.2 3.8   CL 106 106 106  CO2 '24 24 25  '$ GLUCOSE 146* 131* 104*  BUN '16 17 17  '$ CREATININE 0.69 0.84 0.57  CALCIUM 8.1* 8.1* 8.1*  MG  --  2.1 1.9   GFR: Estimated Creatinine Clearance: 79.6 mL/min (by C-G formula based on SCr of 0.57 mg/dL). Liver Function Tests: No results for input(s): "AST", "ALT", "ALKPHOS", "BILITOT", "PROT", "ALBUMIN" in the last 168 hours. No results for input(s): "LIPASE", "AMYLASE" in the last 168 hours. No results for input(s): "AMMONIA" in the last 168 hours. Coagulation Profile: No results for input(s): "INR", "PROTIME" in the last 168 hours. Cardiac Enzymes: No results for input(s): "CKTOTAL", "CKMB", "CKMBINDEX", "TROPONINI" in the last 168 hours. BNP (last 3 results) No results for input(s): "PROBNP" in the last 8760 hours. HbA1C: No results for input(s): "HGBA1C" in the last 72 hours. CBG: Recent Labs  Lab 05/12/22 1138  GLUCAP 172*   Lipid Profile: No results for input(s): "CHOL", "HDL", "LDLCALC", "TRIG", "CHOLHDL", "LDLDIRECT" in the last 72 hours. Thyroid Function Tests: No results for input(s): "TSH", "T4TOTAL", "FREET4", "T3FREE", "THYROIDAB" in the last 72 hours. Anemia Panel: Recent Labs    05/13/22 1058  VITAMINB12 530  FOLATE 22.6  FERRITIN 58  TIBC 303  IRON 33  RETICCTPCT 1.3   Sepsis Labs: No results for input(s): "PROCALCITON", "LATICACIDVEN" in the last 168 hours.  Recent Results (from the past 240 hour(s))  Surgical pcr screen     Status: None   Collection Time: 05/07/22  2:38 PM   Specimen: Nasal Mucosa; Nasal Swab  Result Value Ref Range Status   MRSA, PCR NEGATIVE NEGATIVE Final   Staphylococcus aureus NEGATIVE NEGATIVE Final    Comment: (NOTE) The Xpert SA Assay (FDA approved for NASAL specimens in patients 20 years of age and older), is one component of a comprehensive surveillance program. It is not intended to diagnose infection nor to guide or monitor treatment. Performed at [facility], [address],  [place], [place] [address]   MRSA Next Gen by PCR, Nasal     Status: None   Collection Time: 05/12/22  9:30 AM   Specimen: Nasal Mucosa; Nasal Swab  Result Value Ref Range Status   MRSA by PCR Next Gen NOT DETECTED NOT DETECTED Final    Comment: (NOTE) The GeneXpert MRSA Assay (FDA approved for NASAL specimens only), is one component of a comprehensive MRSA colonization surveillance program. It is not intended to diagnose MRSA infection nor to guide or monitor treatment for MRSA infections. Test performance is not FDA approved in patients less than 33 years old. Performed at [facility] [place] [facility], [address], [place], [place] [address]          Radiology Studies: No results found.      Scheduled Meds:  apixaban  10 mg Oral BID   Followed by   Derrill Memo ON 05/22/2022] apixaban  5 mg Oral BID   [START ON 05/16/2022] aspirin EC  81 mg Oral Daily   Chlorhexidine Gluconate Cloth  6 each Topical Daily  docusate sodium  100 mg Oral BID   DULoxetine  60 mg Oral Daily   folic acid  1 mg Oral Daily   lactulose  10 g Oral Daily   pantoprazole  40 mg Oral Daily   polyethylene glycol  17 g Oral Daily       LOS: 3 days    Time spent: 35 minutes    Zaire Levesque Darleen Crocker, DO [facility]  If 7PM-7AM, please contact night-coverage [email or web address] 05/15/2022, 10:28 AM

## 2022-05-15 NOTE — Progress Notes (Signed)
Patient ID: Whitney Moreno, female   DOB: 1950-10-31, 73 y.o.   MRN: [id number]  POD4  RT TKA  DVT   PE   She is doing well stable. The burning sensation has improved and she says it was there before surgery.   BP (!) 141/76 (BP Location: Left Arm)   Pulse 79   Temp 98.4 F (36.9 C)   Resp 20   Ht '5\' 6"'$  (1.676 m)   Wt 101 kg   LMP 02/16/2008   SpO2 99%   BMI 35.94 kg/m   MS AAO x 3  Resp on O2 trying to wean off  CDV stable  GI no BM yet   MSK  wound stable  Neuro normal      Latest Ref Rng & Units 05/15/2022    5:28 AM 05/14/2022    4:21 AM 05/13/2022    2:52 AM  CBC  WBC 4.0 - 10.5 K/uL 10.7  12.8  13.4   Hemoglobin 12.0 - 15.0 g/dL 9.1  9.2  10.5   Hematocrit 36.0 - 46.0 % 28.3  27.9  32.2   Platelets 150 - 400 K/uL 230  207  187       Latest Ref Rng & Units 05/14/2022    4:21 AM 05/13/2022    2:52 AM 05/12/2022    4:21 AM  BMP  Glucose 70 - 99 mg/dL 104  131  146   BUN 8 - 23 mg/dL '17  17  16   '$ Creatinine 0.44 - 1.00 mg/dL 0.57  0.84  0.69   Sodium 135 - 145 mmol/L 138  138  137   Potassium 3.5 - 5.1 mmol/L 3.8  4.2  4.0   Chloride 98 - 111 mmol/L 106  106  106   CO2 22 - 32 mmol/L '25  24  24   '$ Calcium 8.9 - 10.3 mg/dL 8.1  8.1  8.1      Now on the floor poss transfer tomorrow to rehab

## 2022-05-15 NOTE — Progress Notes (Addendum)
ANTICOAGULATION CONSULT NOTE -   Pharmacy Consult for heparin Indication: pulmonary embolus w/ right heart strain/DVT  No Known Allergies  Patient Measurements: Height: '5\' 6"'$  (167.6 cm) Weight: 101 kg (222 lb 10.6 oz) IBW/kg (Calculated) : 59.3 Heparin Dosing Weight: 82 kg  Vital Signs: Temp: 98.4 F (36.9 C) (01/27 0537) BP: 141/76 (01/27 0537) Pulse Rate: 79 (01/27 0537)  Labs: Recent Labs    05/12/22 0938 05/12/22 1200 05/12/22 1924 05/13/22 0252 05/13/22 1131 05/14/22 0421 05/14/22 1404 05/15/22 0528  HGB  --   --    < > 10.5*  --  9.2*  --  9.1*  HCT  --   --   --  32.2*  --  27.9*  --  28.3*  PLT  --   --   --  187  --  207  --  230  HEPARINUNFRC  --   --    < > >1.10*   < > 0.27* 0.34 0.33  CREATININE  --   --   --  0.84  --  0.57  --   --   TROPONINIHS 19* 637*  --   --   --   --   --   --    < > = values in this interval not displayed.   Estimated Creatinine Clearance: 79.6 mL/min (by C-G formula based on SCr of 0.57 mg/dL).  Assessment: Pharmacy consulted to dose heparin in patient with pulmonary embolism w/ evidence of right heart strain and DVT.  Patient is not on anticoagulation prior to admission.  Today: Heparin remains stable and therapeutic with HL = 0.33. No adverse events noted. Labs stable. Orders received to transition to apixaban    Plan:  APixaban '10mg'$  po bid x 7 days, then 5 mg po BID DC heparin after apixaban start DC associated heparin labs Recommend reducing aspirin '81mg'$  Plan education  Lorenso Courier, PharmD Clinical Pharmacist 05/15/2022 8:52 AM

## 2022-05-16 DIAGNOSIS — M6281 Muscle weakness (generalized): Secondary | ICD-10-CM | POA: Diagnosis not present

## 2022-05-16 DIAGNOSIS — M05711 Rheumatoid arthritis with rheumatoid factor of right shoulder without organ or systems involvement: Secondary | ICD-10-CM | POA: Diagnosis not present

## 2022-05-16 DIAGNOSIS — D62 Acute posthemorrhagic anemia: Secondary | ICD-10-CM | POA: Diagnosis not present

## 2022-05-16 DIAGNOSIS — I700 Atherosclerosis of aorta: Secondary | ICD-10-CM | POA: Diagnosis not present

## 2022-05-16 DIAGNOSIS — I82441 Acute embolism and thrombosis of right tibial vein: Secondary | ICD-10-CM | POA: Diagnosis not present

## 2022-05-16 DIAGNOSIS — I82451 Acute embolism and thrombosis of right peroneal vein: Secondary | ICD-10-CM | POA: Diagnosis not present

## 2022-05-16 DIAGNOSIS — M797 Fibromyalgia: Secondary | ICD-10-CM | POA: Diagnosis not present

## 2022-05-16 DIAGNOSIS — I2699 Other pulmonary embolism without acute cor pulmonale: Secondary | ICD-10-CM | POA: Diagnosis not present

## 2022-05-16 DIAGNOSIS — M1711 Unilateral primary osteoarthritis, right knee: Secondary | ICD-10-CM | POA: Diagnosis not present

## 2022-05-16 DIAGNOSIS — K5900 Constipation, unspecified: Secondary | ICD-10-CM | POA: Diagnosis not present

## 2022-05-16 DIAGNOSIS — K219 Gastro-esophageal reflux disease without esophagitis: Secondary | ICD-10-CM | POA: Diagnosis not present

## 2022-05-16 DIAGNOSIS — I469 Cardiac arrest, cause unspecified: Secondary | ICD-10-CM | POA: Diagnosis not present

## 2022-05-16 DIAGNOSIS — Z471 Aftercare following joint replacement surgery: Secondary | ICD-10-CM | POA: Diagnosis not present

## 2022-05-16 DIAGNOSIS — I2692 Saddle embolus of pulmonary artery without acute cor pulmonale: Secondary | ICD-10-CM | POA: Diagnosis not present

## 2022-05-16 DIAGNOSIS — R262 Difficulty in walking, not elsewhere classified: Secondary | ICD-10-CM | POA: Diagnosis not present

## 2022-05-16 DIAGNOSIS — D649 Anemia, unspecified: Secondary | ICD-10-CM | POA: Diagnosis not present

## 2022-05-16 DIAGNOSIS — I82401 Acute embolism and thrombosis of unspecified deep veins of right lower extremity: Secondary | ICD-10-CM | POA: Diagnosis not present

## 2022-05-16 LAB — RESP PANEL BY RT-PCR (RSV, FLU A&B, COVID)  RVPGX2
Influenza A by PCR: NEGATIVE
Influenza B by PCR: NEGATIVE
Resp Syncytial Virus by PCR: NEGATIVE
SARS Coronavirus 2 by RT PCR: NEGATIVE

## 2022-05-16 LAB — CBC
HCT: 28.8 % — ABNORMAL LOW (ref 36.0–46.0)
Hemoglobin: 9.3 g/dL — ABNORMAL LOW (ref 12.0–15.0)
MCH: 30.6 pg (ref 26.0–34.0)
MCHC: 32.3 g/dL (ref 30.0–36.0)
MCV: 94.7 fL (ref 80.0–100.0)
Platelets: 255 10*3/uL (ref 150–400)
RBC: 3.04 MIL/uL — ABNORMAL LOW (ref 3.87–5.11)
RDW: 14.6 % (ref 11.5–15.5)
WBC: 10.1 10*3/uL (ref 4.0–10.5)
nRBC: 0.7 % — ABNORMAL HIGH (ref 0.0–0.2)

## 2022-05-16 MED ORDER — ASPIRIN 81 MG PO TBEC: 81.0000 mg | DELAYED_RELEASE_TABLET | Freq: Every day | ORAL | 12 refills | Status: DC

## 2022-05-16 MED ORDER — APIXABAN 5 MG PO TABS: 10.0000 mg | ORAL_TABLET | Freq: Two times a day (BID) | ORAL | 0 refills | Status: DC

## 2022-05-16 MED ORDER — APIXABAN 5 MG PO TABS: 5.0000 mg | ORAL_TABLET | Freq: Two times a day (BID) | ORAL | 0 refills | Status: DC

## 2022-05-16 MED ORDER — ACETAMINOPHEN-CODEINE 300-30 MG PO TABS: 1.0000 | ORAL_TABLET | ORAL | 0 refills | Status: DC | PRN

## 2022-05-16 NOTE — Progress Notes (Signed)
Tele called stating patient had a 6 beat run of vtach. Patient is asymptomatic and is sitting up in the bed eating breakfast. MD Manuella Ghazi notified.

## 2022-05-16 NOTE — Progress Notes (Signed)
Report has been given to [facility] the nurse at [facility].

## 2022-05-16 NOTE — Discharge Instructions (Signed)
Information on my medicine - ELIQUIS (apixaban)  Why was Eliquis prescribed for you? Eliquis was prescribed to treat blood clots that may have been found in the veins of your legs (deep vein thrombosis) or in your lungs (pulmonary embolism) and to reduce the risk of them occurring again.  What do You need to know about Eliquis ? The starting dose is 10 mg (two 5 mg tablets) taken TWICE daily for the FIRST SEVEN (7) DAYS, then on 05/22/22   the dose is reduced to ONE 5 mg tablet taken TWICE daily.  Eliquis may be taken with or without food.   Try to take the dose about the same time in the morning and in the evening. If you have difficulty swallowing the tablet whole please discuss with your pharmacist how to take the medication safely.  Take Eliquis exactly as prescribed and DO NOT stop taking Eliquis without talking to the doctor who prescribed the medication.  Stopping may increase your risk of developing a new blood clot.  Refill your prescription before you run out.  After discharge, you should have regular check-up appointments with your healthcare provider that is prescribing your Eliquis.    What do you do if you miss a dose? If a dose of ELIQUIS is not taken at the scheduled time, take it as soon as possible on the same day and twice-daily administration should be resumed. The dose should not be doubled to make up for a missed dose.  Important Safety Information A possible side effect of Eliquis is bleeding. You should call your healthcare provider right away if you experience any of the following: Bleeding from an injury or your nose that does not stop. Unusual colored urine (red or dark brown) or unusual colored stools (red or black). Unusual bruising for unknown reasons. A serious fall or if you hit your head (even if there is no bleeding).  Some medicines may interact with Eliquis and might increase your risk of bleeding or clotting while on Eliquis. To help avoid this,  consult your healthcare provider or pharmacist prior to using any new prescription or non-prescription medications, including herbals, vitamins, non-steroidal anti-inflammatory drugs (NSAIDs) and supplements.  This website has more information on Eliquis (apixaban): [email or web address]://[email or web address].com/eliquis/home

## 2022-05-16 NOTE — TOC Transition Note (Signed)
Transition of Care [facility]) - CM/SW Discharge Note   Patient Details  Name: Whitney Moreno MRN: [id number] Date of Birth: 04-Feb-1951  [facility] CM/SW Contact:  Iona Beard, [place] Phone Number: 05/16/2022, 12:54 PM   Clinical Narrative:    Pt medically ready for D/C to [facility] today. CSW updated RN that once COVID test has resulted and is negative pt can be D/C to facility. CSW updated pts spouse of plan for D/C. TOC signing off.   Final next level of care: Skilled Nursing Facility Barriers to Discharge: Barriers Resolved   Patient Goals and CMS Choice CMS Medicare.gov Compare Post Acute Care list provided to:: Patient Choice offered to / list presented to : Patient  Discharge Placement                Patient chooses bed at: [facility] Patient to be transferred to facility by: facility staff Name of family member notified: Annise Boran (husband) Patient and family notified of of transfer: 05/16/22  Discharge Plan and Services Additional resources added to the After Visit Summary for   In-house Referral: Clinical Social Work Discharge Planning Services: CM Consult Post Acute Care Choice: [place]: PT [facility] Agency: [place] Date [place]: 05/12/22   Representative spoke with at [place]: Marjory Lies  Social Determinants of Health ([place]) Interventions SDOH Screenings   Food Insecurity: No Food Insecurity (05/11/2022)  Housing: Low Risk  (05/11/2022)  Transportation Needs: No Transportation Needs (05/11/2022)  Utilities: Not At Risk (05/11/2022)  Depression (PHQ2-9): Low Risk  (01/25/2022)  Tobacco Use: Medium Risk (05/12/2022)     Readmission Risk Interventions     No data to display

## 2022-05-16 NOTE — Discharge Summary (Signed)
Physician Discharge Summary  LAROSE BATRES UJW:[id number] DOB: 1950-08-25 DOA: 05/11/2022  PCP: Asencion Noble, MD  Admit date: 05/11/2022  Discharge date: 05/16/2022  Admitted From:Home  Disposition:  SNF  Recommendations for Outpatient Follow-up:  Follow up with PCP in 1-2 weeks Follow-up with orthopedics, Dr. Aline Brochure as recommended Remain on Eliquis as prescribed for PE/DVT Continue pain medications and other home medications as noted below  Home Health: None  Equipment/Devices: Currently requiring 2 L nasal cannula  Discharge Condition:Stable  CODE STATUS: Full  Diet recommendation: Heart Healthy  Brief/Interim Summary: Whitney Moreno is a 72 y.o. female with medical history significant for osteoarthritis of right knee status post total knee arthroplasty 1/23, rheumatoid arthritis, peripheral neuropathy, fibromyalgia, obesity, and GERD.  She had a cardiac arrest the following day of her procedure and underwent CPR with ROSC and was intubated.  She was noted to have a submassive PE and was started on heparin drip which continued for approximately 48 hours, after which point in time, she was transition to Eliquis.  She was seen by physical therapy with need for SNF and currently has a bed available.  She is now in stable condition for discharge with no other acute events noted.  Discharge Diagnoses:  Principal Problem:   Cardiac arrest [facility]) Active Problems:   Unilateral primary osteoarthritis, right knee   Rheumatoid arthritis (HCC)   Anemia   Neuropathy   Obesity   Primary osteoarthritis of right knee   Pulmonary embolus [facility])  Principal discharge diagnosis: Status post cardiac arrest secondary to submassive PE in the setting of recent TKA due to right knee osteoarthritis on 1/23.  Discharge Instructions  Discharge Instructions     Diet - low sodium heart healthy   Complete by: As directed    Increase activity slowly   Complete by: As directed       Allergies  as of 05/16/2022   No Known Allergies      Medication List     TAKE these medications    acetaminophen 325 MG tablet Commonly known as: TYLENOL Take 650 mg by mouth every 6 (six) hours as needed for moderate pain.   acetaminophen-codeine 300-30 MG tablet Commonly known as: TYLENOL #3 Take 1-2 tablets by mouth every 4 (four) hours as needed for moderate pain or severe pain.   apixaban 5 MG Tabs tablet Commonly known as: ELIQUIS Take 2 tablets (10 mg total) by mouth 2 (two) times daily for 6 days.   apixaban 5 MG Tabs tablet Commonly known as: ELIQUIS Take 1 tablet (5 mg total) by mouth 2 (two) times daily. Start taking on: May 22, 2022   aspirin EC 81 MG tablet Take 1 tablet (81 mg total) by mouth daily. Swallow whole. Start taking on: May 17, 2022   Calcium-Vitamin D 600-200 MG-UNIT tablet Take 2 tablets by mouth daily.   Cymbalta 60 MG capsule Generic drug: DULoxetine Take 60 mg by mouth daily.   Diclofenac Sodium 1 % Crea 4 gram qid as needed   folic acid 1 MG tablet Commonly known as: FOLVITE Take 1 mg by mouth daily.   gabapentin 300 MG capsule Commonly known as: NEURONTIN Take 2 capsules (600 mg total) by mouth at bedtime. What changed: how much to take   gabapentin 100 MG capsule Commonly known as: Neurontin Take 1 capsule (100 mg total) by mouth 3 (three) times daily as needed. What changed: Another medication with the same name was changed. Make sure you understand how and  when to take each.   ibuprofen 200 MG tablet Commonly known as: ADVIL Take 600 mg by mouth every 6 (six) hours as needed for moderate pain.   lactulose 10 GM/15ML solution Commonly known as: CHRONULAC TAKE 15 ML BY MOUTH DAILY   lidocaine-prilocaine cream Commonly known as: EMLA 4 gram qid as needed   meloxicam 15 MG tablet Commonly known as: MOBIC Take 15 mg by mouth daily as needed for pain. What changed: Another medication with the same name was removed.  Continue taking this medication, and follow the directions you see here.   methotrexate 2.5 MG tablet Commonly known as: RHEUMATREX Take 15 mg by mouth every 'Sunday. Caution:Chemotherapy. Protect from light. Takes on Sundays.   pantoprazole 40 MG tablet Commonly known as: PROTONIX Take 1 tablet (40 mg total) by mouth daily.   polyvinyl alcohol 1.4 % ophthalmic solution Commonly known as: LIQUIFILM TEARS Place 1 drop into both eyes as needed for dry eyes.   Vitamin D3 250 MCG (10000 UT) capsule Take 10,000 Units by mouth daily.   zinc gluconate 50 MG tablet Take 50 mg by mouth daily.        Contact information for follow-up providers     Fagan, Roy, MD. Schedule an appointment as soon as possible for a visit in 2 week(s).   Specialty: Internal Medicine Contact information: [address] [place] [address] [phone]         Harrison, Stanley E, MD. Go to.   Specialties: Orthopedic Surgery, Radiology Contact information: [address] [place] [address] [phone]              Contact information for after-discharge care     Destination     [facility] Preferred SNF .   Service: Skilled Nursing Contact information: [address] [place] [address] [phone]                    No Known Allergies  Consultations: Orthopedics-Dr. Harrison PCCM   Procedures/Studies: ECHOCARDIOGRAM COMPLETE  Result Date: 05/12/2022    ECHOCARDIOGRAM REPORT   Patient Name:   Whitney Moreno Date of Exam: 05/12/2022 Medical Rec #:  [id number]       Height:       66.0 in Accession #:    [id number]      Weight:       220.5 lb Date of Birth:  01/01/1951       BSA:          2.084 m Patient Age:    71 years        BP:           116/69 mmHg Patient Gender: F               HR:           84'$  bpm. Exam Location:  Inpatient Procedure: 2D Echo, Cardiac Doppler, Color Doppler and Intracardiac            Opacification  Agent Indications:    Pulmonary Embolus I26.09  History:        Patient has no prior history of Echocardiogram examinations.                 Arrythmias:Cardiac Arrest; Risk Factors:Former Smoker.  Sonographer:    Greer Pickerel Referring Phys: [id number] [place] D [facility]  Sonographer Comments: Technically difficult study due to poor echo windows, suboptimal subcostal window, suboptimal apical window and suboptimal parasternal window.  Image acquisition challenging due to respiratory motion. IMPRESSIONS  1. Left ventricular ejection fraction, by estimation, is 55 to 60%. The left ventricle has normal function. Left ventricular endocardial border not optimally defined to evaluate regional wall motion. Left ventricular diastolic parameters are consistent with Grade I diastolic dysfunction (impaired relaxation). There is the interventricular septum is flattened in diastole ('D' shaped left ventricle), consistent with right ventricular volume overload.  2. Cannot exclude thormbus at RV apex in setting of Definity contrast - could be off axis imaging of trabeculae as this moves with cardiac cycle. Patient with known acute DVT and pulmonary embolus (currently anticoagulated per chart). Right ventricular systolic function is moderately reduced. The right ventricular size is mildly enlarged. There is mildly elevated pulmonary artery systolic pressure. The estimated right ventricular systolic pressure is 67.1 mmHg.  3. The mitral valve is grossly normal. Trivial mitral valve regurgitation.  4. The aortic valve is tricuspid. Aortic valve regurgitation is not visualized.  5. The inferior vena cava is dilated in size with <50% respiratory variability, suggesting right atrial pressure of 15 mmHg. Comparison(s): No prior Echocardiogram. FINDINGS  Left Ventricle: Left ventricular ejection fraction, by estimation, is 55 to 60%. The left ventricle has normal function. Left ventricular endocardial border not optimally defined to evaluate  regional wall motion. Definity contrast agent was given IV to delineate the left ventricular endocardial borders. The left ventricular internal cavity size was normal in size. There is borderline left ventricular hypertrophy. The interventricular septum is flattened in diastole ('D' shaped left ventricle), consistent with right ventricular volume overload. Left ventricular diastolic parameters are consistent with Grade I diastolic dysfunction (impaired relaxation). Right Ventricle: Cannot exclude thormbus at RV apex in setting of Definity contrast - could be off axis imaging of trabeculae as this moves with cardiac cycle. Patient with known acute DVT and pulmonary embolus (currently anticoagulated per chart). The right ventricular size is mildly enlarged. No increase in right ventricular wall thickness. Right ventricular systolic function is moderately reduced. There is mildly elevated pulmonary artery systolic pressure. The tricuspid regurgitant velocity is 2.44  m/s, and with an assumed right atrial pressure of 15 mmHg, the estimated right ventricular systolic pressure is 24.5 mmHg. Left Atrium: Left atrial size was normal in size. Right Atrium: Right atrial size was normal in size. Pericardium: There is no evidence of pericardial effusion. Mitral Valve: The mitral valve is grossly normal. Trivial mitral valve regurgitation. Tricuspid Valve: The tricuspid valve is grossly normal. Tricuspid valve regurgitation is trivial. Aortic Valve: The aortic valve is tricuspid. Aortic valve regurgitation is not visualized. Pulmonic Valve: The pulmonic valve was not well visualized. Pulmonic valve regurgitation is trivial. Aorta: The aortic root is normal in size and structure. Venous: The inferior vena cava is dilated in size with less than 50% respiratory variability, suggesting right atrial pressure of 15 mmHg. IAS/Shunts: The interatrial septum was not well visualized.  LEFT VENTRICLE PLAX 2D LVIDd:         4.10 cm    Diastology LVIDs:         2.10 cm   LV e' medial:    3.11 cm/s LV PW:         1.10 cm   LV E/e' medial:  17.6 LV IVS:        0.70 cm   LV e' lateral:   6.53 cm/s LVOT diam:     1.70 cm   LV E/e' lateral: 8.4 LV SV:         18  LV SV Index:   9 LVOT Area:     2.27 cm  RIGHT VENTRICLE RV S prime:     9.01 cm/s TAPSE (M-mode): 1.8 cm LEFT ATRIUM           Index LA diam:      2.80 cm 1.34 cm/m LA Vol (A4C): 35.9 ml 17.22 ml/m  AORTIC VALVE LVOT Vmax:   58.60 cm/s LVOT Vmean:  41.100 cm/s LVOT VTI:    0.079 m  AORTA Ao Root diam: 3.45 cm Ao Asc diam:  3.50 cm MITRAL VALVE               TRICUSPID VALVE MV Area (PHT): 3.60 cm    TR Peak grad:   23.8 mmHg MV Decel Time: 211 msec    TR Vmax:        244.00 cm/s MV E velocity: 54.80 cm/s MV A velocity: 72.00 cm/s  SHUNTS MV E/A ratio:  0.76        Systemic VTI:  0.08 m                            Systemic Diam: 1.70 cm Rozann Lesches MD Electronically signed by Rozann Lesches MD Signature Date/Time: 05/12/2022/4:16:54 PM    Final    US Venous Img Lower Bilateral (DVT)  Result Date: 05/12/2022 CLINICAL DATA:  Pulmonary embolism. Evaluate for lower extremity DVT. EXAM: BILATERAL LOWER EXTREMITY VENOUS DOPPLER ULTRASOUND TECHNIQUE: Gray-scale sonography with graded compression, as well as color Doppler and duplex ultrasound were performed to evaluate the lower extremity deep venous systems from the level of the common femoral vein and including the common femoral, femoral, profunda femoral, popliteal and calf veins including the posterior tibial, peroneal and gastrocnemius veins when visible. The superficial great saphenous vein was also interrogated. Spectral Doppler was utilized to evaluate flow at rest and with distal augmentation maneuvers in the common femoral, femoral and popliteal veins. COMPARISON:  None Available. FINDINGS: RIGHT LOWER EXTREMITY Common Femoral Vein: No evidence of thrombus. Normal compressibility, respiratory phasicity and response to  augmentation. Saphenofemoral Junction: No evidence of thrombus. Normal compressibility and flow on color Doppler imaging. Profunda Femoral Vein: No evidence of thrombus. Normal compressibility and flow on color Doppler imaging. Femoral Vein: No evidence of thrombus. Normal compressibility, respiratory phasicity and response to augmentation. Popliteal Vein: No evidence of thrombus. Normal compressibility, respiratory phasicity and response to augmentation. Calf Veins: Positive for deep venous thrombosis. One of the paired posterior tibial veins is not compressible and has no color Doppler flow. There is also a non compressible peroneal vein. Superficial Great Saphenous Vein: No evidence of thrombus. Normal compressibility. Other Findings:  None. LEFT LOWER EXTREMITY Common Femoral Vein: No evidence of thrombus. Normal compressibility, respiratory phasicity and response to augmentation. Saphenofemoral Junction: No evidence of thrombus. Normal compressibility and flow on color Doppler imaging. Profunda Femoral Vein: No evidence of thrombus. Normal compressibility and flow on color Doppler imaging. Femoral Vein: No evidence of thrombus. Normal compressibility, respiratory phasicity and response to augmentation. Popliteal Vein: No evidence of thrombus. Normal compressibility, respiratory phasicity and response to augmentation. Calf Veins: No evidence of thrombus. Normal compressibility and flow on color Doppler imaging. Superficial Great Saphenous Vein: No evidence of thrombus. Normal compressibility. Other Findings:  None. IMPRESSION: 1. Positive for deep venous thrombosis in the right lower extremity. Thrombus involving right posterior tibial and right peroneal veins. 2.  Negative for deep venous thrombosis in left lower extremity. Electronically  Signed   By: Markus Daft M.D.   On: 05/12/2022 13:20   CT Angio Chest Pulmonary Embolism (PE) W or WO Contrast  Result Date: 05/12/2022 CLINICAL DATA:  Pulmonary embolism  suspected. High probability. Cardiopulmonary arrest today. EXAM: CT ANGIOGRAPHY CHEST WITH CONTRAST TECHNIQUE: Multidetector CT imaging of the chest was performed using the standard protocol during bolus administration of intravenous contrast. Multiplanar CT image reconstructions and MIPs were obtained to evaluate the vascular anatomy. RADIATION DOSE REDUCTION: This exam was performed according to the departmental dose-optimization program which includes automated exposure control, adjustment of the mA and/or kV according to patient size and/or use of iterative reconstruction technique. CONTRAST:  41m OMNIPAQUE IOHEXOL 350 MG/ML SOLN COMPARISON:  Chest radiography same day FINDINGS: Cardiovascular: Heart size is normal. Right ventricular to left ventricular ratio is 1.6, consistent with right heart strain. There are multiple bilateral pulmonary emboli. Minimal aortic atherosclerotic calcification is noted. No visible coronary artery calcification. Mediastinum/Nodes: No mass or adenopathy. Lungs/Pleura: Dependent pulmonary atelectasis left more than right. Upper Abdomen: Negative Musculoskeletal: Ordinary spinal degenerative changes. Review of the MIP images confirms the above findings. IMPRESSION: Positive for acute PE with CT evidence of right heart strain (RV/LV Ratio = 1.6 ) consistent with at least submassive (intermediate risk) PE. The presence of right heart strain has been associated with an increased risk of morbidity and mortality. Please refer to the "PE Focused" order set in EPIC. Call report in progress. Electronically Signed   By: MNelson ChimesM.D.   On: 05/12/2022 11:18   DG CHEST PORT 1 VIEW  Result Date: 05/12/2022 CLINICAL DATA:  72year old female enteric tube placement, intubated. EXAM: PORTABLE CHEST 1 VIEW COMPARISON:  Portable chest 08/12/2013. FINDINGS: Portable AP semi upright view at 0918 hours. Pacer or resuscitation pads project over the abdomen upper quadrant. Endotracheal tube in  good position with tip between the clavicles and carina. Enteric tube placed into the stomach, loops in the gastric fundus. Moderate gastric air. Similar low lung volumes. Stable cardiac size and mediastinal contours. Chronic tortuous descending thoracic aorta. Allowing for portable technique the lungs are clear. No pneumothorax or pleural effusion. No acute osseous abnormality identified. IMPRESSION: 1. Satisfactory ET tube and enteric tube. 2. Moderate gas distended stomach, recommend NG tube to suction. 3.  No acute cardiopulmonary abnormality. Electronically Signed   By: HGenevie AnnM.D.   On: 05/12/2022 09:47   DG Knee Right Port  Result Date: 05/11/2022 CLINICAL DATA:  Status post knee replacement EXAM: PORTABLE RIGHT KNEE - 2 VIEW COMPARISON:  08/17/2021 FINDINGS: Status post right total knee arthroplasty. No periprosthetic lucency or fracture. Near anatomic alignment of the prosthetic components. Air within the soft tissues is not unexpected postoperatively. Superficial skin staples. IMPRESSION: Status post right total knee arthroplasty without complicating features. Electronically Signed   By: AMerilyn BabaM.D.   On: 05/11/2022 11:14     Discharge Exam: Vitals:   05/15/22 2057 05/16/22 0446  BP: (!) 110/97 135/83  Pulse: 98 80  Resp: 19 18  Temp: 99.1 F (37.3 C) 98.7 F (37.1 C)  SpO2: 98% 100%   Vitals:   05/15/22 0537 05/15/22 1800 05/15/22 2057 05/16/22 0446  BP: (!) 141/76 139/78 (!) 110/97 135/83  Pulse: 79 (!) 103 98 80  Resp: '20 16 19 18  '$ Temp: 98.4 F (36.9 C) 98.4 F (36.9 C) 99.1 F (37.3 C) 98.7 F (37.1 C)  TempSrc:  Oral Oral   SpO2: 99% 96% 98% 100%  Weight:  Height:        General: Pt is alert, awake, not in acute distress Cardiovascular: RRR, S1/S2 +, no rubs, no gallops Respiratory: CTA bilaterally, no wheezing, no rhonchi, 2 L nasal cannula Abdominal: Soft, NT, ND, bowel sounds + Extremities: no edema, no cyanosis, dressing to right knee  C/D/I    The results of significant diagnostics from this hospitalization (including imaging, microbiology, ancillary and laboratory) are listed below for reference.     Microbiology: Recent Results (from the past 240 hour(s))  Surgical pcr screen     Status: None   Collection Time: 05/07/22  2:38 PM   Specimen: Nasal Mucosa; Nasal Swab  Result Value Ref Range Status   MRSA, PCR NEGATIVE NEGATIVE Final   Staphylococcus aureus NEGATIVE NEGATIVE Final    Comment: (NOTE) The Xpert SA Assay (FDA approved for NASAL specimens in patients 103 years of age and older), is one component of a comprehensive surveillance program. It is not intended to diagnose infection nor to guide or monitor treatment. Performed at [facility], [address], [place], [place] [address]   MRSA Next Gen by PCR, Nasal     Status: None   Collection Time: 05/12/22  9:30 AM   Specimen: Nasal Mucosa; Nasal Swab  Result Value Ref Range Status   MRSA by PCR Next Gen NOT DETECTED NOT DETECTED Final    Comment: (NOTE) The GeneXpert MRSA Assay (FDA approved for NASAL specimens only), is one component of a comprehensive MRSA colonization surveillance program. It is not intended to diagnose MRSA infection nor to guide or monitor treatment for MRSA infections. Test performance is not FDA approved in patients less than 68 years old. Performed at [facility], [address] [place], [place] [address]      Labs: BNP (last 3 results) No results for input(s): "BNP" in the last 8760 hours. Basic Metabolic Panel: Recent Labs  Lab 05/12/22 0421 05/13/22 0252 05/14/22 0421  NA 137 138 138  K 4.0 4.2 3.8  CL 106 106 106  CO2 '24 24 25  '$ GLUCOSE 146* 131* 104*  BUN '16 17 17  '$ CREATININE 0.69 0.84 0.57  CALCIUM 8.1* 8.1* 8.1*  MG  --  2.1 1.9   Liver Function Tests: No results for input(s): "AST", "ALT", "ALKPHOS", "BILITOT", "PROT", "ALBUMIN" in the last 168 hours. No results for input(s): "LIPASE", "AMYLASE" in  the last 168 hours. No results for input(s): "AMMONIA" in the last 168 hours. CBC: Recent Labs  Lab 05/12/22 0421 05/13/22 0252 05/14/22 0421 05/15/22 0528 05/16/22 0430  WBC 13.1* 13.4* 12.8* 10.7* 10.1  HGB 10.8* 10.5* 9.2* 9.1* 9.3*  HCT 33.8* 32.2* 27.9* 28.3* 28.8*  MCV 95.8 95.0 94.6 95.6 94.7  PLT 235 187 207 230 255   Cardiac Enzymes: No results for input(s): "CKTOTAL", "CKMB", "CKMBINDEX", "TROPONINI" in the last 168 hours. BNP: Invalid input(s): "POCBNP" CBG: Recent Labs  Lab 05/12/22 1138  GLUCAP 172*   D-Dimer No results for input(s): "DDIMER" in the last 72 hours. Hgb A1c No results for input(s): "HGBA1C" in the last 72 hours. Lipid Profile No results for input(s): "CHOL", "HDL", "LDLCALC", "TRIG", "CHOLHDL", "LDLDIRECT" in the last 72 hours. Thyroid function studies No results for input(s): "TSH", "T4TOTAL", "T3FREE", "THYROIDAB" in the last 72 hours.  Invalid input(s): "FREET3" Anemia work up Recent Labs    05/13/22 1058  VITAMINB12 530  FOLATE 22.6  FERRITIN 58  TIBC 303  IRON 33  RETICCTPCT 1.3   Urinalysis    Component  Value Date/Time   COLORURINE YELLOW 08/06/2021 0609   APPEARANCEUR CLEAR 08/06/2021 0609   LABSPEC 1.021 08/06/2021 0609   PHURINE 6.0 08/06/2021 0609   GLUCOSEU NEGATIVE 08/06/2021 0609   HGBUR NEGATIVE 08/06/2021 0609   BILIRUBINUR NEGATIVE 08/06/2021 0609   BILIRUBINUR n 07/09/2014 1332   KETONESUR NEGATIVE 08/06/2021 0609   PROTEINUR NEGATIVE 08/06/2021 0609   UROBILINOGEN negative 07/09/2014 1332   UROBILINOGEN 4.0 (H) 08/12/2013 0750   NITRITE NEGATIVE 08/06/2021 0609   LEUKOCYTESUR NEGATIVE 08/06/2021 0609   Sepsis Labs Recent Labs  Lab 05/13/22 0252 05/14/22 0421 05/15/22 0528 05/16/22 0430  WBC 13.4* 12.8* 10.7* 10.1   Microbiology Recent Results (from the past 240 hour(s))  Surgical pcr screen     Status: None   Collection Time: 05/07/22  2:38 PM   Specimen: Nasal Mucosa; Nasal Swab  Result Value  Ref Range Status   MRSA, PCR NEGATIVE NEGATIVE Final   Staphylococcus aureus NEGATIVE NEGATIVE Final    Comment: (NOTE) The Xpert SA Assay (FDA approved for NASAL specimens in patients 48 years of age and older), is one component of a comprehensive surveillance program. It is not intended to diagnose infection nor to guide or monitor treatment. Performed at [facility], [address], [place], [place] [address]   MRSA Next Gen by PCR, Nasal     Status: None   Collection Time: 05/12/22  9:30 AM   Specimen: Nasal Mucosa; Nasal Swab  Result Value Ref Range Status   MRSA by PCR Next Gen NOT DETECTED NOT DETECTED Final    Comment: (NOTE) The GeneXpert MRSA Assay (FDA approved for NASAL specimens only), is one component of a comprehensive MRSA colonization surveillance program. It is not intended to diagnose MRSA infection nor to guide or monitor treatment for MRSA infections. Test performance is not FDA approved in patients less than 23 years old. Performed at [facility], [address], [place], [place] [address]      Time coordinating discharge: 35 minutes  SIGNED:   Rodena Goldmann, DO [facility] 05/16/2022, 10:01 AM  If 7PM-7AM, please contact night-coverage [email or web address]

## 2022-05-17 ENCOUNTER — Encounter (HOSPITAL_COMMUNITY): Payer: Medicare PPO

## 2022-05-17 ENCOUNTER — Encounter (HOSPITAL_COMMUNITY): Payer: Self-pay | Admitting: Orthopedic Surgery

## 2022-05-18 ENCOUNTER — Encounter: Payer: Self-pay | Admitting: Adult Health

## 2022-05-18 ENCOUNTER — Non-Acute Institutional Stay (SKILLED_NURSING_FACILITY): Payer: Medicare PPO | Admitting: Adult Health

## 2022-05-18 DIAGNOSIS — I469 Cardiac arrest, cause unspecified: Secondary | ICD-10-CM | POA: Diagnosis not present

## 2022-05-18 DIAGNOSIS — I2699 Other pulmonary embolism without acute cor pulmonale: Secondary | ICD-10-CM

## 2022-05-18 DIAGNOSIS — K219 Gastro-esophageal reflux disease without esophagitis: Secondary | ICD-10-CM

## 2022-05-18 DIAGNOSIS — M1711 Unilateral primary osteoarthritis, right knee: Secondary | ICD-10-CM

## 2022-05-18 DIAGNOSIS — I82401 Acute embolism and thrombosis of unspecified deep veins of right lower extremity: Secondary | ICD-10-CM | POA: Insufficient documentation

## 2022-05-18 DIAGNOSIS — M797 Fibromyalgia: Secondary | ICD-10-CM

## 2022-05-18 DIAGNOSIS — D62 Acute posthemorrhagic anemia: Secondary | ICD-10-CM | POA: Insufficient documentation

## 2022-05-18 DIAGNOSIS — I700 Atherosclerosis of aorta: Secondary | ICD-10-CM

## 2022-05-18 DIAGNOSIS — M05711 Rheumatoid arthritis with rheumatoid factor of right shoulder without organ or systems involvement: Secondary | ICD-10-CM

## 2022-05-18 DIAGNOSIS — K5900 Constipation, unspecified: Secondary | ICD-10-CM
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
## 2022-05-19 ENCOUNTER — Encounter (HOSPITAL_COMMUNITY): Payer: Medicare PPO

## 2022-05-21 ENCOUNTER — Encounter (HOSPITAL_COMMUNITY): Payer: Medicare PPO

## 2022-05-22 LAB — TYPE AND SCREEN
ABO/RH(D): O POS
Antibody Screen: NEGATIVE
Unit division: 0
Unit division: 0

## 2022-05-22 LAB — BPAM RBC
Blood Product Expiration Date: 202402172359
Blood Product Expiration Date: 202402242359
Unit Type and Rh: 5100
Unit Type and Rh: 5100

## 2022-05-24 ENCOUNTER — Non-Acute Institutional Stay (SKILLED_NURSING_FACILITY): Payer: Medicare PPO | Admitting: Internal Medicine

## 2022-05-24 ENCOUNTER — Encounter (HOSPITAL_COMMUNITY): Payer: Medicare PPO

## 2022-05-24 ENCOUNTER — Other Ambulatory Visit (HOSPITAL_COMMUNITY)
Admission: RE | Admit: 2022-05-24 | Discharge: 2022-05-24 | Disposition: A | Discharge status: Home / Self Care | Payer: Medicare PPO | Source: Skilled Nursing Facility | Attending: Adult Health | Admitting: Adult Health

## 2022-05-24 ENCOUNTER — Encounter (HOSPITAL_COMMUNITY): Payer: Medicare PPO | Admitting: Physical Therapy

## 2022-05-24 DIAGNOSIS — I469 Cardiac arrest, cause unspecified: Secondary | ICD-10-CM

## 2022-05-24 DIAGNOSIS — M1711 Unilateral primary osteoarthritis, right knee: Secondary | ICD-10-CM

## 2022-05-24 DIAGNOSIS — D649 Anemia, unspecified: Secondary | ICD-10-CM | POA: Insufficient documentation

## 2022-05-24 DIAGNOSIS — I2699 Other pulmonary embolism without acute cor pulmonale: Secondary | ICD-10-CM

## 2022-05-24 LAB — CBC
HCT: 35.1 % — ABNORMAL LOW (ref 36.0–46.0)
Hemoglobin: 11.3 g/dL — ABNORMAL LOW (ref 12.0–15.0)
MCH: 30.8 pg (ref 26.0–34.0)
MCHC: 32.2 g/dL (ref 30.0–36.0)
MCV: 95.6 fL (ref 80.0–100.0)
Platelets: 435 10*3/uL — ABNORMAL HIGH (ref 150–400)
RBC: 3.67 MIL/uL — ABNORMAL LOW (ref 3.87–5.11)
RDW: 14.8 % (ref 11.5–15.5)
WBC: 12.7 10*3/uL — ABNORMAL HIGH (ref 4.0–10.5)
nRBC: 0 % (ref 0.0–0.2)

## 2022-05-24 NOTE — Progress Notes (Unsigned)
   NURSING HOME LOCATION:  [facility] ROOM NUMBER:  [id number] P  CODE STATUS:  Full Code  PCP:  Asencion Noble MD  This is a comprehensive admission note to this SNFperformed on this date less than 30 days from date of admission. Included are preadmission medical/surgical history; reconciled medication list; family history; social history and comprehensive review of systems.  Corrections and additions to the records were documented. Comprehensive physical exam was also performed. Additionally a clinical summary was entered for each active diagnosis pertinent to this admission in the Problem List to enhance continuity of care.  HPI:  Past medical and surgical history:  Social history:  Family history:   Review of systems: Clinical neurocognitive deficits made validity of responses questionable & preventing ROS completion. Date given as Constitutional: No fever, significant weight change, fatigue  Eyes: No redness, discharge, pain, vision change ENT/mouth: No nasal congestion, purulent discharge, earache, change in hearing, sore throat  Cardiovascular: No chest pain, palpitations, paroxysmal nocturnal dyspnea, claudication, edema  Respiratory: No cough, sputum production, hemoptysis, DOE, significant snoring, apnea Gastrointestinal: No heartburn, dysphagia, abdominal pain, nausea /vomiting, rectal bleeding, melena, change in bowels Genitourinary: No dysuria, hematuria, pyuria, incontinence, nocturia Musculoskeletal: No joint stiffness, joint swelling, weakness, pain Dermatologic: No rash, pruritus, change in appearance of skin Neurologic: No dizziness, headache, syncope, seizures, numbness, tingling Psychiatric: No significant anxiety, depression, insomnia, anorexia Endocrine: No change in hair/skin/nails, excessive thirst, excessive hunger, excessive urination  Hematologic/lymphatic: No significant bruising, lymphadenopathy, abnormal bleeding Allergy/immunology: No  itchy/watery eyes, significant sneezing, urticaria, angioedema  Physical exam:  Pertinent or positive findings: General appearance: Adequately nourished; no acute distress, increased work of breathing is present.   Lymphatic: No lymphadenopathy about the head, neck, axilla. Eyes: No conjunctival inflammation or lid edema is present. There is no scleral icterus. Ears:  External ear exam shows no significant lesions or deformities.   Nose:  External nasal examination shows no deformity or inflammation. Nasal mucosa are pink and moist without lesions, exudates Oral exam: Lips and gums are healthy appearing.There is no oropharyngeal erythema or exudate. Neck:  No thyromegaly, masses, tenderness noted.    Heart:  Normal rate and regular rhythm. S1 and S2 normal without gallop, murmur, click, rub.  Lungs: Chest clear to auscultation without wheezes, rhonchi, rales, rubs. Abdomen: Bowel sounds are normal.  Abdomen is soft and nontender with no organomegaly, hernias, masses. GU: Deferred  Extremities:  No cyanosis, clubbing, edema. Neurologic exam:  Strength equal  in upper & lower extremities. Balance, Rhomberg, finger to nose testing could not be completed due to clinical state Deep tendon reflexes are equal Skin: Warm & dry w/o tenting. No significant lesions or rash.  See clinical summary under each active problem in the Problem List with associated updated therapeutic plan

## 2022-05-25 ENCOUNTER — Encounter: Payer: Self-pay | Admitting: Internal Medicine

## 2022-05-25 ENCOUNTER — Encounter: Payer: Self-pay | Admitting: Adult Health

## 2022-05-25 ENCOUNTER — Other Ambulatory Visit: Payer: Self-pay | Admitting: Adult Health

## 2022-05-25 ENCOUNTER — Non-Acute Institutional Stay (SKILLED_NURSING_FACILITY): Payer: Medicare PPO | Admitting: Adult Health

## 2022-05-25 DIAGNOSIS — M1711 Unilateral primary osteoarthritis, right knee: Secondary | ICD-10-CM | POA: Diagnosis not present

## 2022-05-25 DIAGNOSIS — Z96651 Presence of right artificial knee joint: Secondary | ICD-10-CM | POA: Insufficient documentation

## 2022-05-25 DIAGNOSIS — I700 Atherosclerosis of aorta: Secondary | ICD-10-CM | POA: Diagnosis not present

## 2022-05-25 DIAGNOSIS — I2699 Other pulmonary embolism without acute cor pulmonale: Secondary | ICD-10-CM

## 2022-05-25 DIAGNOSIS — M05711 Rheumatoid arthritis with rheumatoid factor of right shoulder without organ or systems involvement: Secondary | ICD-10-CM | POA: Diagnosis not present

## 2022-05-25 DIAGNOSIS — I82401 Acute embolism and thrombosis of unspecified deep veins of right lower extremity: Secondary | ICD-10-CM | POA: Diagnosis not present

## 2022-05-25 DIAGNOSIS — K5900 Constipation, unspecified: Secondary | ICD-10-CM

## 2022-05-25 MED ORDER — FOLIC ACID 1 MG PO TABS: 1.0000 mg | ORAL_TABLET | Freq: Every day | ORAL | 0 refills | Status: AC

## 2022-05-25 MED ORDER — GABAPENTIN 100 MG PO CAPS: 100.0000 mg | ORAL_CAPSULE | Freq: Three times a day (TID) | ORAL | 0 refills | Status: DC | PRN

## 2022-05-25 MED ORDER — MELOXICAM 15 MG PO TABS: 15.0000 mg | ORAL_TABLET | Freq: Every day | ORAL | 0 refills | Status: AC | PRN

## 2022-05-25 MED ORDER — PANTOPRAZOLE SODIUM 40 MG PO TBEC: 40.0000 mg | DELAYED_RELEASE_TABLET | Freq: Every day | ORAL | 0 refills | Status: DC

## 2022-05-25 MED ORDER — METHOTREXATE SODIUM 2.5 MG PO TABS: 15.0000 mg | ORAL_TABLET | ORAL | 0 refills | Status: AC

## 2022-05-25 MED ORDER — GABAPENTIN 600 MG PO TABS: 600.0000 mg | ORAL_TABLET | Freq: Every day | ORAL | 0 refills | Status: DC

## 2022-05-25 MED ORDER — DULOXETINE HCL 60 MG PO CPEP: 60.0000 mg | ORAL_CAPSULE | Freq: Every day | ORAL | 0 refills | Status: AC

## 2022-05-25 MED ORDER — LACTULOSE 10 GM/15ML PO SOLN: ORAL | 0 refills | Status: DC

## 2022-05-25 MED ORDER — APIXABAN 5 MG PO TABS: 5.0000 mg | ORAL_TABLET | Freq: Two times a day (BID) | ORAL | 0 refills | Status: DC

## 2022-05-25 NOTE — Assessment & Plan Note (Signed)
Normally Eliquis would be continued X 3 months & then risk reassessed for recurrent DVT; BUT PMH of splenectomy may increase risk of recurrent DVT as per UpToDate review. This concern will be transmitted to her PCP , Dr Willey Blade

## 2022-05-25 NOTE — Assessment & Plan Note (Signed)
PT/OT @ SNF as tolerated. 
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
## 2022-05-25 NOTE — Patient Instructions (Signed)
See assessment and plan under each diagnosis in the problem list and acutely for this visit. Dr Willey Blade will be notified of possible increased risk of recurrent DVT wiith PMH of splenectomy.

## 2022-05-25 NOTE — Assessment & Plan Note (Signed)
She has no active cardiopulmonary symptoms on Eliquis.

## 2022-05-26 ENCOUNTER — Encounter: Payer: Self-pay | Admitting: Internal Medicine

## 2022-05-26 ENCOUNTER — Encounter (HOSPITAL_COMMUNITY): Payer: Medicare PPO

## 2022-05-26 ENCOUNTER — Ambulatory Visit (INDEPENDENT_AMBULATORY_CARE_PROVIDER_SITE_OTHER): Payer: Medicare PPO | Admitting: Orthopedic Surgery

## 2022-05-26 ENCOUNTER — Encounter: Payer: Self-pay | Admitting: Orthopedic Surgery

## 2022-05-26 VITALS — Ht 66.0 in | Wt 204.0 lb

## 2022-05-26 DIAGNOSIS — I2699 Other pulmonary embolism without acute cor pulmonale: Secondary | ICD-10-CM

## 2022-05-26 DIAGNOSIS — Z96651 Presence of right artificial knee joint: Secondary | ICD-10-CM

## 2022-05-26 DIAGNOSIS — M1711 Unilateral primary osteoarthritis, right knee: Secondary | ICD-10-CM

## 2022-05-26 DIAGNOSIS — I82401 Acute embolism and thrombosis of unspecified deep veins of right lower extremity: Secondary | ICD-10-CM

## 2022-05-26 NOTE — Progress Notes (Signed)
POST OP   Encounter Diagnoses  Name Primary?   S/P TKR (total knee replacement), right 05/11/22 Yes   Unilateral primary osteoarthritis, right knee    Leg DVT (deep venous thromboembolism), acute, right ([place])    Acute pulmonary embolism without acute cor pulmonale, unspecified pulmonary embolism type [facility])      Chief Complaint  Patient presents with   Routine Post Op    R TKA 05/11/22    Postop visit #1 status post right total knee complicated by DVT and PE patient actually coded had to be resuscitated and was in the ICU for several days and then transferred over to rehab.  She was discharged from rehab today  She is independent out of the bed she is using her walker but says she does not really have to use it  Her incision looks good her staples were taken out  She remains on Eliquis  For pain she is taking Tylenol and gabapentin  The opioids just make her feel bad and she does not need them.  She will continue therapy at home and then follow-up in 3 weeks for postop visit #2 TAKE these medications     acetaminophen 325 MG tablet Commonly known as: TYLENOL Take 650 mg by mouth every 6 (six) hours as needed for moderate pain.    acetaminophen-codeine 300-30 MG tablet Commonly known as: TYLENOL #3 Take 1-2 tablets by mouth every 4 (four) hours as needed for moderate pain or severe pain.    apixaban 5 MG Tabs tablet Commonly known as: ELIQUIS Take 2 tablets (10 mg total) by mouth 2 (two) times daily for 6 days.    apixaban 5 MG Tabs tablet Commonly known as: ELIQUIS Take 1 tablet (5 mg total) by mouth 2 (two) times daily. Start taking on: May 22, 2022    aspirin EC 81 MG tablet Take 1 tablet (81 mg total) by mouth daily. Swallow whole. Start taking on: May 17, 2022    Calcium-Vitamin D 600-200 MG-UNIT tablet Take 2 tablets by mouth daily.    Cymbalta 60 MG capsule Generic drug: DULoxetine Take 60 mg by mouth daily.    Diclofenac Sodium 1 % Crea 4 gram  qid as needed    folic acid 1 MG tablet Commonly known as: FOLVITE Take 1 mg by mouth daily.    gabapentin 300 MG capsule Commonly known as: NEURONTIN Take 2 capsules (600 mg total) by mouth at bedtime. What changed: how much to take

## 2022-05-27 ENCOUNTER — Encounter (HOSPITAL_COMMUNITY): Payer: Medicare PPO

## 2022-05-27 ENCOUNTER — Encounter (HOSPITAL_COMMUNITY): Payer: Medicare PPO | Admitting: Physical Therapy

## 2022-05-28 ENCOUNTER — Encounter (HOSPITAL_COMMUNITY): Payer: Medicare PPO

## 2022-05-31 ENCOUNTER — Encounter (HOSPITAL_COMMUNITY): Payer: Medicare PPO | Admitting: Physical Therapy

## 2022-05-31 DIAGNOSIS — M05711 Rheumatoid arthritis with rheumatoid factor of right shoulder without organ or systems involvement: Secondary | ICD-10-CM | POA: Diagnosis not present

## 2022-05-31 DIAGNOSIS — K5900 Constipation, unspecified: Secondary | ICD-10-CM | POA: Diagnosis not present

## 2022-05-31 DIAGNOSIS — I700 Atherosclerosis of aorta: Secondary | ICD-10-CM | POA: Diagnosis not present

## 2022-05-31 DIAGNOSIS — G629 Polyneuropathy, unspecified: Secondary | ICD-10-CM | POA: Diagnosis not present

## 2022-05-31 DIAGNOSIS — I82401 Acute embolism and thrombosis of unspecified deep veins of right lower extremity: Secondary | ICD-10-CM | POA: Diagnosis not present

## 2022-05-31 DIAGNOSIS — I2699 Other pulmonary embolism without acute cor pulmonale: Secondary | ICD-10-CM | POA: Diagnosis not present

## 2022-05-31 DIAGNOSIS — Z471 Aftercare following joint replacement surgery: Secondary | ICD-10-CM | POA: Diagnosis not present

## 2022-05-31 DIAGNOSIS — M797 Fibromyalgia: Secondary | ICD-10-CM | POA: Diagnosis not present

## 2022-05-31 DIAGNOSIS — I509 Heart failure, unspecified: Secondary | ICD-10-CM | POA: Diagnosis not present

## 2022-06-01 DIAGNOSIS — D649 Anemia, unspecified: Secondary | ICD-10-CM | POA: Diagnosis not present

## 2022-06-01 DIAGNOSIS — I2699 Other pulmonary embolism without acute cor pulmonale: Secondary | ICD-10-CM | POA: Diagnosis not present

## 2022-06-01 DIAGNOSIS — Z471 Aftercare following joint replacement surgery: Secondary | ICD-10-CM | POA: Diagnosis not present

## 2022-06-02 ENCOUNTER — Encounter (HOSPITAL_COMMUNITY): Payer: Medicare PPO | Admitting: Physical Therapy

## 2022-06-02 DIAGNOSIS — I82401 Acute embolism and thrombosis of unspecified deep veins of right lower extremity: Secondary | ICD-10-CM | POA: Diagnosis not present

## 2022-06-02 DIAGNOSIS — I2699 Other pulmonary embolism without acute cor pulmonale: Secondary | ICD-10-CM | POA: Diagnosis not present

## 2022-06-02 DIAGNOSIS — G629 Polyneuropathy, unspecified: Secondary | ICD-10-CM | POA: Diagnosis not present

## 2022-06-02 DIAGNOSIS — M797 Fibromyalgia: Secondary | ICD-10-CM | POA: Diagnosis not present

## 2022-06-02 DIAGNOSIS — I700 Atherosclerosis of aorta: Secondary | ICD-10-CM | POA: Diagnosis not present

## 2022-06-02 DIAGNOSIS — Z471 Aftercare following joint replacement surgery: Secondary | ICD-10-CM | POA: Diagnosis not present

## 2022-06-02 DIAGNOSIS — I509 Heart failure, unspecified: Secondary | ICD-10-CM | POA: Diagnosis not present

## 2022-06-02 DIAGNOSIS — M05711 Rheumatoid arthritis with rheumatoid factor of right shoulder without organ or systems involvement: Secondary | ICD-10-CM | POA: Diagnosis not present

## 2022-06-02 DIAGNOSIS — K5900 Constipation, unspecified: Secondary | ICD-10-CM | POA: Diagnosis not present

## 2022-06-04 DIAGNOSIS — I700 Atherosclerosis of aorta: Secondary | ICD-10-CM | POA: Diagnosis not present

## 2022-06-04 DIAGNOSIS — Z471 Aftercare following joint replacement surgery: Secondary | ICD-10-CM | POA: Diagnosis not present

## 2022-06-04 DIAGNOSIS — K5900 Constipation, unspecified: Secondary | ICD-10-CM | POA: Diagnosis not present

## 2022-06-04 DIAGNOSIS — M05711 Rheumatoid arthritis with rheumatoid factor of right shoulder without organ or systems involvement: Secondary | ICD-10-CM | POA: Diagnosis not present

## 2022-06-04 DIAGNOSIS — G629 Polyneuropathy, unspecified: Secondary | ICD-10-CM | POA: Diagnosis not present

## 2022-06-04 DIAGNOSIS — I82401 Acute embolism and thrombosis of unspecified deep veins of right lower extremity: Secondary | ICD-10-CM | POA: Diagnosis not present

## 2022-06-04 DIAGNOSIS — M797 Fibromyalgia: Secondary | ICD-10-CM | POA: Diagnosis not present

## 2022-06-04 DIAGNOSIS — I2699 Other pulmonary embolism without acute cor pulmonale: Secondary | ICD-10-CM | POA: Diagnosis not present

## 2022-06-04 DIAGNOSIS — I509 Heart failure, unspecified: Secondary | ICD-10-CM | POA: Diagnosis not present

## 2022-06-07 ENCOUNTER — Encounter (HOSPITAL_COMMUNITY): Payer: Medicare PPO

## 2022-06-07 DIAGNOSIS — M797 Fibromyalgia: Secondary | ICD-10-CM | POA: Diagnosis not present

## 2022-06-07 DIAGNOSIS — Z471 Aftercare following joint replacement surgery: Secondary | ICD-10-CM | POA: Diagnosis not present

## 2022-06-07 DIAGNOSIS — G629 Polyneuropathy, unspecified: Secondary | ICD-10-CM | POA: Diagnosis not present

## 2022-06-07 DIAGNOSIS — I82401 Acute embolism and thrombosis of unspecified deep veins of right lower extremity: Secondary | ICD-10-CM | POA: Diagnosis not present

## 2022-06-07 DIAGNOSIS — I700 Atherosclerosis of aorta: Secondary | ICD-10-CM | POA: Diagnosis not present

## 2022-06-07 DIAGNOSIS — K5900 Constipation, unspecified: Secondary | ICD-10-CM | POA: Diagnosis not present

## 2022-06-07 DIAGNOSIS — I509 Heart failure, unspecified: Secondary | ICD-10-CM | POA: Diagnosis not present

## 2022-06-07 DIAGNOSIS — I2699 Other pulmonary embolism without acute cor pulmonale: Secondary | ICD-10-CM | POA: Diagnosis not present

## 2022-06-07 DIAGNOSIS — M05711 Rheumatoid arthritis with rheumatoid factor of right shoulder without organ or systems involvement: Secondary | ICD-10-CM | POA: Diagnosis not present

## 2022-06-09 ENCOUNTER — Encounter (HOSPITAL_COMMUNITY): Payer: Medicare PPO

## 2022-06-09 DIAGNOSIS — M797 Fibromyalgia: Secondary | ICD-10-CM | POA: Diagnosis not present

## 2022-06-09 DIAGNOSIS — Z471 Aftercare following joint replacement surgery: Secondary | ICD-10-CM | POA: Diagnosis not present

## 2022-06-09 DIAGNOSIS — G629 Polyneuropathy, unspecified: Secondary | ICD-10-CM | POA: Diagnosis not present

## 2022-06-09 DIAGNOSIS — I700 Atherosclerosis of aorta: Secondary | ICD-10-CM | POA: Diagnosis not present

## 2022-06-09 DIAGNOSIS — I2699 Other pulmonary embolism without acute cor pulmonale: Secondary | ICD-10-CM | POA: Diagnosis not present

## 2022-06-09 DIAGNOSIS — M05711 Rheumatoid arthritis with rheumatoid factor of right shoulder without organ or systems involvement: Secondary | ICD-10-CM | POA: Diagnosis not present

## 2022-06-09 DIAGNOSIS — I82401 Acute embolism and thrombosis of unspecified deep veins of right lower extremity: Secondary | ICD-10-CM | POA: Diagnosis not present

## 2022-06-09 DIAGNOSIS — K5900 Constipation, unspecified: Secondary | ICD-10-CM | POA: Diagnosis not present

## 2022-06-09 DIAGNOSIS — I509 Heart failure, unspecified: Secondary | ICD-10-CM | POA: Diagnosis not present

## 2022-06-10 ENCOUNTER — Other Ambulatory Visit: Payer: Self-pay | Admitting: Orthopedic Surgery

## 2022-06-10 ENCOUNTER — Telehealth: Payer: Self-pay | Admitting: Orthopedic Surgery

## 2022-06-10 DIAGNOSIS — Z96651 Presence of right artificial knee joint: Secondary | ICD-10-CM

## 2022-06-10 MED ORDER — METHOCARBAMOL 500 MG PO TABS: 500.0000 mg | ORAL_TABLET | Freq: Three times a day (TID) | ORAL | 1 refills | Status: DC

## 2022-06-10 NOTE — Telephone Encounter (Signed)
I called her we discussed

## 2022-06-10 NOTE — Progress Notes (Signed)
Meds ordered this encounter  Medications   methocarbamol (ROBAXIN) 500 MG tablet    Sig: Take 1 tablet (500 mg total) by mouth 3 (three) times daily.    Dispense:  60 tablet    Refill:  1

## 2022-06-10 NOTE — Telephone Encounter (Signed)
Patient asking if you can send in a muscle relaxer for her, said PT told her it may help Whitney Moreno  Also asking if ok to go to spa and get toenails trimmed and moisturized

## 2022-06-10 NOTE — Telephone Encounter (Signed)
yes

## 2022-06-11 DIAGNOSIS — G629 Polyneuropathy, unspecified: Secondary | ICD-10-CM | POA: Diagnosis not present

## 2022-06-11 DIAGNOSIS — I2699 Other pulmonary embolism without acute cor pulmonale: Secondary | ICD-10-CM | POA: Diagnosis not present

## 2022-06-11 DIAGNOSIS — I509 Heart failure, unspecified: Secondary | ICD-10-CM | POA: Diagnosis not present

## 2022-06-11 DIAGNOSIS — K5900 Constipation, unspecified: Secondary | ICD-10-CM | POA: Diagnosis not present

## 2022-06-11 DIAGNOSIS — M05711 Rheumatoid arthritis with rheumatoid factor of right shoulder without organ or systems involvement: Secondary | ICD-10-CM | POA: Diagnosis not present

## 2022-06-11 DIAGNOSIS — I700 Atherosclerosis of aorta: Secondary | ICD-10-CM | POA: Diagnosis not present

## 2022-06-11 DIAGNOSIS — I82401 Acute embolism and thrombosis of unspecified deep veins of right lower extremity: Secondary | ICD-10-CM | POA: Diagnosis not present

## 2022-06-11 DIAGNOSIS — M797 Fibromyalgia: Secondary | ICD-10-CM | POA: Diagnosis not present

## 2022-06-11 DIAGNOSIS — Z471 Aftercare following joint replacement surgery: Secondary | ICD-10-CM | POA: Diagnosis not present

## 2022-06-14 ENCOUNTER — Encounter (HOSPITAL_COMMUNITY): Payer: Medicare PPO

## 2022-06-14 DIAGNOSIS — I700 Atherosclerosis of aorta: Secondary | ICD-10-CM | POA: Diagnosis not present

## 2022-06-14 DIAGNOSIS — I82401 Acute embolism and thrombosis of unspecified deep veins of right lower extremity: Secondary | ICD-10-CM | POA: Diagnosis not present

## 2022-06-14 DIAGNOSIS — I509 Heart failure, unspecified: Secondary | ICD-10-CM | POA: Diagnosis not present

## 2022-06-14 DIAGNOSIS — M05711 Rheumatoid arthritis with rheumatoid factor of right shoulder without organ or systems involvement: Secondary | ICD-10-CM | POA: Diagnosis not present

## 2022-06-14 DIAGNOSIS — I2699 Other pulmonary embolism without acute cor pulmonale: Secondary | ICD-10-CM | POA: Diagnosis not present

## 2022-06-14 DIAGNOSIS — G629 Polyneuropathy, unspecified: Secondary | ICD-10-CM | POA: Diagnosis not present

## 2022-06-14 DIAGNOSIS — K5900 Constipation, unspecified: Secondary | ICD-10-CM | POA: Diagnosis not present

## 2022-06-14 DIAGNOSIS — M797 Fibromyalgia: Secondary | ICD-10-CM | POA: Diagnosis not present

## 2022-06-14 DIAGNOSIS — Z471 Aftercare following joint replacement surgery: Secondary | ICD-10-CM | POA: Diagnosis not present

## 2022-06-16 ENCOUNTER — Encounter (HOSPITAL_COMMUNITY): Payer: Medicare PPO | Admitting: Physical Therapy

## 2022-06-16 ENCOUNTER — Encounter: Payer: Self-pay | Admitting: Orthopedic Surgery

## 2022-06-16 ENCOUNTER — Ambulatory Visit (INDEPENDENT_AMBULATORY_CARE_PROVIDER_SITE_OTHER): Payer: Medicare PPO | Admitting: Orthopedic Surgery

## 2022-06-16 ENCOUNTER — Encounter (HOSPITAL_COMMUNITY): Payer: Self-pay | Admitting: Physical Therapy

## 2022-06-16 ENCOUNTER — Ambulatory Visit (HOSPITAL_COMMUNITY): Payer: Medicare PPO | Attending: Orthopedic Surgery | Admitting: Physical Therapy

## 2022-06-16 DIAGNOSIS — M25561 Pain in right knee: Secondary | ICD-10-CM | POA: Diagnosis not present

## 2022-06-16 DIAGNOSIS — M1711 Unilateral primary osteoarthritis, right knee: Secondary | ICD-10-CM | POA: Insufficient documentation

## 2022-06-16 DIAGNOSIS — R2689 Other abnormalities of gait and mobility: Secondary | ICD-10-CM | POA: Insufficient documentation

## 2022-06-16 DIAGNOSIS — Z96651 Presence of right artificial knee joint: Secondary | ICD-10-CM

## 2022-06-16 NOTE — Progress Notes (Signed)
POST OP VISIT   Encounter Diagnosis  Name Primary?   S/P TKR (total knee replacement), right 05/11/22 Yes   She had a postop complication DVT pulmonary embolus  Currently on Eliquis    POD  # 35, week 5   Therapy site starts outpx today   Cedric continues to improve.  She is walking today without a cane no limp  Her range of motion is 0-85   Fu 6 weeks continue therapy
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
## 2022-06-17 ENCOUNTER — Encounter: Payer: Self-pay | Admitting: Radiology

## 2022-06-21 ENCOUNTER — Ambulatory Visit (HOSPITAL_COMMUNITY): Payer: Medicare PPO | Attending: Orthopedic Surgery

## 2022-06-21 ENCOUNTER — Encounter (HOSPITAL_COMMUNITY): Payer: Medicare PPO

## 2022-06-21 DIAGNOSIS — M25561 Pain in right knee: Secondary | ICD-10-CM | POA: Diagnosis not present

## 2022-06-21 DIAGNOSIS — R2689 Other abnormalities of gait and mobility: Secondary | ICD-10-CM | POA: Diagnosis not present
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
## 2022-06-22 ENCOUNTER — Ambulatory Visit (HOSPITAL_COMMUNITY): Payer: Medicare PPO

## 2022-06-22 DIAGNOSIS — M25561 Pain in right knee: Secondary | ICD-10-CM

## 2022-06-22 DIAGNOSIS — R2689 Other abnormalities of gait and mobility: Secondary | ICD-10-CM | POA: Diagnosis not present
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
## 2022-06-24 ENCOUNTER — Encounter (HOSPITAL_COMMUNITY): Payer: Medicare PPO

## 2022-06-28 ENCOUNTER — Ambulatory Visit (HOSPITAL_COMMUNITY): Payer: Medicare PPO | Admitting: Physical Therapy

## 2022-06-28 DIAGNOSIS — R2689 Other abnormalities of gait and mobility: Secondary | ICD-10-CM

## 2022-06-28 DIAGNOSIS — M25561 Pain in right knee: Secondary | ICD-10-CM
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
## 2022-07-01 ENCOUNTER — Encounter (HOSPITAL_COMMUNITY): Payer: Medicare PPO | Admitting: Physical Therapy

## 2022-07-01 ENCOUNTER — Ambulatory Visit (HOSPITAL_COMMUNITY): Payer: Medicare PPO

## 2022-07-01 DIAGNOSIS — R2689 Other abnormalities of gait and mobility: Secondary | ICD-10-CM | POA: Diagnosis not present

## 2022-07-01 DIAGNOSIS — M25561 Pain in right knee: Secondary | ICD-10-CM
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
## 2022-07-05 ENCOUNTER — Encounter: Payer: Self-pay | Admitting: Orthopedic Surgery

## 2022-07-05 ENCOUNTER — Ambulatory Visit (INDEPENDENT_AMBULATORY_CARE_PROVIDER_SITE_OTHER): Payer: Medicare PPO | Admitting: Orthopedic Surgery

## 2022-07-05 DIAGNOSIS — Z96651 Presence of right artificial knee joint: Secondary | ICD-10-CM

## 2022-07-05 DIAGNOSIS — M5431 Sciatica, right side: Secondary | ICD-10-CM

## 2022-07-05 MED ORDER — PREDNISONE 10 MG (48) PO TBPK: ORAL_TABLET | Freq: Every day | ORAL | 0 refills | Status: DC

## 2022-07-05 MED ORDER — METHOCARBAMOL 750 MG PO TABS: 750.0000 mg | ORAL_TABLET | Freq: Four times a day (QID) | ORAL | 2 refills | Status: DC

## 2022-07-05 NOTE — Progress Notes (Signed)
Chief Complaint  Patient presents with   Knee Pain    S/P TKA RT knee DOS 05/11/22//has been painful and swelling ~2 weeks/ no injury/ patient states she feels warmth just above and around knee and below it when it swells/ pain is just above ankle to about mid RT foot/ muscle relaxers aren't working. Pain is keeping her from sleeping    Wicke is doing well actually with her knee the pain is actually coming from her back and radiating down her right leg  She is currently on Tylenol, 600 mg of gabapentin at night, Robaxin 500 mg 3 times a day   Exam reveals tenderness in the lower back and lateral leg rating down to the top of the foot  Strength is normal  Straight leg is equivocal  Assessment and plan  Status post right total knee with blood clot her knee actually looks good her range of motion is only 0-90 but we delayed physical therapy for approximately 3 weeks  Calf itself is not swollen  She is not short of breath  Encounter Diagnoses  Name Primary?   Sciatica of right side Yes   S/P TKR (total knee replacement), right 05/11/22     Recommend the following  Ok to use heat on your back Continue the Gabapentin  Use the muscle relaxer as well, new dose Rest / no exercises for 2 days Will send in a prednisone taper follow directions on the pack.  Continue the Tylenol as needed   Return in 2 weeks if not better then we can entertain imaging Meds ordered this encounter  Medications   predniSONE (STERAPRED UNI-PAK 48 TAB) 10 MG (48) TBPK tablet    Sig: Take by mouth daily.    Dispense:  48 tablet    Refill:  0   methocarbamol (ROBAXIN) 750 MG tablet    Sig: Take 1 tablet (750 mg total) by mouth 4 (four) times daily.    Dispense:  60 tablet    Refill:  2

## 2022-07-05 NOTE — Patient Instructions (Addendum)
Ok to use heat on your back Continue the Gabapentin  Use the muscle relaxer as well, new dose Rest / no exercises for 2 days Will send in a prednisone taper follow directions on the pack.  Continue the Tylenol as needed

## 2022-07-07 ENCOUNTER — Encounter (HOSPITAL_COMMUNITY): Payer: Medicare PPO

## 2022-07-09 ENCOUNTER — Encounter (HOSPITAL_COMMUNITY): Payer: Medicare PPO

## 2022-07-13 ENCOUNTER — Ambulatory Visit (HOSPITAL_COMMUNITY): Payer: Medicare PPO

## 2022-07-13 DIAGNOSIS — R2689 Other abnormalities of gait and mobility: Secondary | ICD-10-CM | POA: Diagnosis not present

## 2022-07-13 DIAGNOSIS — M25561 Pain in right knee: Secondary | ICD-10-CM
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
## 2022-07-15 ENCOUNTER — Ambulatory Visit (HOSPITAL_COMMUNITY): Payer: Medicare PPO

## 2022-07-15 DIAGNOSIS — M25561 Pain in right knee: Secondary | ICD-10-CM | POA: Diagnosis not present

## 2022-07-15 DIAGNOSIS — R2689 Other abnormalities of gait and mobility: Secondary | ICD-10-CM
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
## 2022-07-19 ENCOUNTER — Encounter: Payer: Self-pay | Admitting: Orthopedic Surgery

## 2022-07-19 ENCOUNTER — Ambulatory Visit: Payer: Medicare PPO | Admitting: Orthopedic Surgery

## 2022-07-19 ENCOUNTER — Ambulatory Visit (HOSPITAL_COMMUNITY): Payer: Medicare PPO | Attending: Orthopedic Surgery | Admitting: Physical Therapy

## 2022-07-19 DIAGNOSIS — M5431 Sciatica, right side: Secondary | ICD-10-CM

## 2022-07-19 DIAGNOSIS — Z96651 Presence of right artificial knee joint: Secondary | ICD-10-CM

## 2022-07-19 DIAGNOSIS — M25561 Pain in right knee: Secondary | ICD-10-CM | POA: Insufficient documentation

## 2022-07-19 DIAGNOSIS — R2689 Other abnormalities of gait and mobility: Secondary | ICD-10-CM | POA: Diagnosis not present

## 2022-07-19 NOTE — Progress Notes (Signed)
.    Follow-up  Chief complaint follow-up after right total knee on January 23, [id number] complicated by deep vein thrombosis and pulmonary embolus  Patient started therapy later in her course also developed some sciatica which we treated last time with methocarbamol and prednisone Dosepak  The sciatic symptoms have resolved she still having some lower back pain and numbness when she sits  Current range of motion is 0-90 in the office 102 degrees with therapy  She is a little depressed because she is not moving fast  I counseled her that she had a knee replacement she had a complication there was a DVT we almost lost her from the PE this delayed her therapy so she is 4 weeks behind overall she is doing well  She will be out of work until I see her on 6 May mainly secondary to her inability to sit for 5 hours
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
## 2022-07-19 NOTE — Patient Instructions (Signed)
NEW NOTE OOW UNTIL MAY 6

## 2022-07-21 ENCOUNTER — Ambulatory Visit (HOSPITAL_COMMUNITY): Payer: Medicare PPO

## 2022-07-21 ENCOUNTER — Encounter (HOSPITAL_COMMUNITY): Payer: Self-pay

## 2022-07-21 DIAGNOSIS — R2689 Other abnormalities of gait and mobility: Secondary | ICD-10-CM

## 2022-07-21 DIAGNOSIS — M25561 Pain in right knee: Secondary | ICD-10-CM

## 2022-07-21 DIAGNOSIS — I2693 Single subsegmental pulmonary embolism without acute cor pulmonale: Secondary | ICD-10-CM | POA: Diagnosis not present
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
## 2022-07-26 ENCOUNTER — Encounter (HOSPITAL_COMMUNITY): Payer: Medicare PPO | Admitting: Physical Therapy

## 2022-07-28 ENCOUNTER — Ambulatory Visit (HOSPITAL_COMMUNITY): Payer: Medicare PPO | Admitting: Physical Therapy

## 2022-07-28 DIAGNOSIS — D649 Anemia, unspecified: Secondary | ICD-10-CM | POA: Diagnosis not present

## 2022-07-28 DIAGNOSIS — M545 Low back pain, unspecified: Secondary | ICD-10-CM | POA: Diagnosis not present

## 2022-07-28 DIAGNOSIS — I82401 Acute embolism and thrombosis of unspecified deep veins of right lower extremity: Secondary | ICD-10-CM | POA: Diagnosis not present

## 2022-07-28 DIAGNOSIS — M25561 Pain in right knee: Secondary | ICD-10-CM | POA: Diagnosis not present

## 2022-07-28 DIAGNOSIS — R2689 Other abnormalities of gait and mobility: Secondary | ICD-10-CM
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
## 2022-07-29 ENCOUNTER — Encounter: Payer: Medicare PPO | Admitting: Orthopedic Surgery

## 2022-08-03 ENCOUNTER — Ambulatory Visit (HOSPITAL_COMMUNITY): Payer: Medicare PPO | Admitting: Physical Therapy

## 2022-08-03 DIAGNOSIS — R2689 Other abnormalities of gait and mobility: Secondary | ICD-10-CM | POA: Diagnosis not present

## 2022-08-03 DIAGNOSIS — M25561 Pain in right knee: Secondary | ICD-10-CM | POA: Diagnosis not present
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
## 2022-08-18 ENCOUNTER — Ambulatory Visit (HOSPITAL_COMMUNITY): Payer: Medicare PPO | Attending: Orthopedic Surgery | Admitting: Physical Therapy

## 2022-08-18 ENCOUNTER — Encounter (HOSPITAL_COMMUNITY): Payer: Self-pay | Admitting: Physical Therapy

## 2022-08-18 DIAGNOSIS — R2689 Other abnormalities of gait and mobility: Secondary | ICD-10-CM | POA: Diagnosis not present

## 2022-08-18 DIAGNOSIS — M25561 Pain in right knee: Secondary | ICD-10-CM | POA: Insufficient documentation
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
## 2022-08-20 ENCOUNTER — Encounter (HOSPITAL_COMMUNITY): Payer: Self-pay

## 2022-08-20 ENCOUNTER — Ambulatory Visit (HOSPITAL_COMMUNITY): Payer: Medicare PPO

## 2022-08-20 DIAGNOSIS — R2689 Other abnormalities of gait and mobility: Secondary | ICD-10-CM | POA: Diagnosis not present

## 2022-08-20 DIAGNOSIS — M25561 Pain in right knee: Secondary | ICD-10-CM | POA: Diagnosis not present
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
## 2022-08-23 ENCOUNTER — Ambulatory Visit (HOSPITAL_COMMUNITY): Payer: Medicare PPO | Admitting: Physical Therapy

## 2022-08-23 ENCOUNTER — Ambulatory Visit (INDEPENDENT_AMBULATORY_CARE_PROVIDER_SITE_OTHER): Payer: Medicare PPO | Admitting: Orthopedic Surgery

## 2022-08-23 DIAGNOSIS — M5431 Sciatica, right side: Secondary | ICD-10-CM

## 2022-08-23 DIAGNOSIS — I82401 Acute embolism and thrombosis of unspecified deep veins of right lower extremity: Secondary | ICD-10-CM

## 2022-08-23 DIAGNOSIS — R2689 Other abnormalities of gait and mobility: Secondary | ICD-10-CM

## 2022-08-23 DIAGNOSIS — Z96651 Presence of right artificial knee joint: Secondary | ICD-10-CM

## 2022-08-23 DIAGNOSIS — M1711 Unilateral primary osteoarthritis, right knee: Secondary | ICD-10-CM

## 2022-08-23 DIAGNOSIS — I2699 Other pulmonary embolism without acute cor pulmonale: Secondary | ICD-10-CM | POA: Diagnosis not present

## 2022-08-23 DIAGNOSIS — Z79899 Other long term (current) drug therapy: Secondary | ICD-10-CM | POA: Diagnosis not present

## 2022-08-23 DIAGNOSIS — M25561 Pain in right knee: Secondary | ICD-10-CM

## 2022-08-23 DIAGNOSIS — M0609 Rheumatoid arthritis without rheumatoid factor, multiple sites: Secondary | ICD-10-CM | POA: Diagnosis not present
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
## 2022-08-23 NOTE — Progress Notes (Signed)
Chief Complaint  Patient presents with   Knee Pain    TKA DOS 05/11/22 Right - back feeling better   Encounter Diagnoses  Name Primary?   S/P TKR (total knee replacement), right 05/11/22 Yes   Sciatica of right side    Unilateral primary osteoarthritis, right knee    Leg DVT (deep venous thromboembolism), acute, right (HCC)    Acute pulmonary embolism without acute cor pulmonale, unspecified pulmonary embolism type (HCC)     Graley advance to 104 degrees of flexion which is good considering her delayed start in therapy secondary to the PE  She is having some tightness in the hamstrings best noted when she is in the prone position I taught her how to do prone hang exercises with a goal of 30 minutes at a time daily  She did quit her job  Recommend she continue therapy until the end of the month follow-up with me in 2 months continue her anticoagulation Eliquis 5 mg twice daily

## 2022-08-25 ENCOUNTER — Ambulatory Visit (HOSPITAL_COMMUNITY): Payer: Medicare PPO | Admitting: Physical Therapy

## 2022-08-25 DIAGNOSIS — R2689 Other abnormalities of gait and mobility: Secondary | ICD-10-CM | POA: Diagnosis not present

## 2022-08-25 DIAGNOSIS — M25561 Pain in right knee: Secondary | ICD-10-CM
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
## 2022-08-30 ENCOUNTER — Ambulatory Visit (HOSPITAL_COMMUNITY): Payer: Medicare PPO | Admitting: Physical Therapy

## 2022-08-30 ENCOUNTER — Other Ambulatory Visit (HOSPITAL_COMMUNITY): Payer: Self-pay | Admitting: Internal Medicine

## 2022-08-30 DIAGNOSIS — R2689 Other abnormalities of gait and mobility: Secondary | ICD-10-CM | POA: Diagnosis not present

## 2022-08-30 DIAGNOSIS — M25561 Pain in right knee: Secondary | ICD-10-CM

## 2022-08-30 DIAGNOSIS — Z1231 Encounter for screening mammogram for malignant neoplasm of breast: Secondary | ICD-10-CM
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
## 2022-09-01 ENCOUNTER — Ambulatory Visit (HOSPITAL_COMMUNITY): Payer: Medicare PPO | Admitting: Physical Therapy

## 2022-09-01 ENCOUNTER — Encounter (HOSPITAL_COMMUNITY): Payer: Self-pay | Admitting: Physical Therapy

## 2022-09-01 DIAGNOSIS — R2689 Other abnormalities of gait and mobility: Secondary | ICD-10-CM | POA: Diagnosis not present

## 2022-09-01 DIAGNOSIS — M25561 Pain in right knee: Secondary | ICD-10-CM | POA: Diagnosis not present
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
## 2022-09-07 ENCOUNTER — Ambulatory Visit (HOSPITAL_COMMUNITY): Payer: Medicare PPO

## 2022-09-07 ENCOUNTER — Encounter (HOSPITAL_COMMUNITY): Payer: Self-pay

## 2022-09-07 DIAGNOSIS — M25561 Pain in right knee: Secondary | ICD-10-CM | POA: Diagnosis not present

## 2022-09-07 DIAGNOSIS — R2689 Other abnormalities of gait and mobility: Secondary | ICD-10-CM
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
## 2022-09-09 ENCOUNTER — Encounter (HOSPITAL_COMMUNITY): Payer: Self-pay

## 2022-09-09 ENCOUNTER — Ambulatory Visit (HOSPITAL_COMMUNITY): Payer: Medicare PPO

## 2022-09-09 DIAGNOSIS — M25561 Pain in right knee: Secondary | ICD-10-CM

## 2022-09-09 DIAGNOSIS — R2689 Other abnormalities of gait and mobility: Secondary | ICD-10-CM | POA: Diagnosis not present
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
## 2022-09-14 ENCOUNTER — Encounter (HOSPITAL_COMMUNITY): Payer: Self-pay | Admitting: Physical Therapy

## 2022-09-14 ENCOUNTER — Ambulatory Visit (HOSPITAL_COMMUNITY): Payer: Medicare PPO | Admitting: Physical Therapy

## 2022-09-14 DIAGNOSIS — M25561 Pain in right knee: Secondary | ICD-10-CM | POA: Diagnosis not present

## 2022-09-14 DIAGNOSIS — R2689 Other abnormalities of gait and mobility: Secondary | ICD-10-CM
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
## 2022-09-15 ENCOUNTER — Ambulatory Visit: Payer: Medicare PPO | Admitting: Pulmonary Disease

## 2022-09-15 ENCOUNTER — Encounter: Payer: Self-pay | Admitting: Pulmonary Disease

## 2022-09-15 VITALS — BP 113/71 | HR 76 | Ht 66.0 in | Wt 204.0 lb

## 2022-09-15 DIAGNOSIS — I2699 Other pulmonary embolism without acute cor pulmonale: Secondary | ICD-10-CM

## 2022-09-15 NOTE — Progress Notes (Signed)
   Subjective:    Patient ID: Whitney Moreno, female    DOB: 1950/10/04, 72 y.o.   MRN: [id number]  HPI Chief Complaint  Patient presents with   Follow-up    Currently does not express concerns regarding her breathing.      72 yo woman who underwent right knee arthroplasty on 1/23 for severe osteoarthritis.  She was being ambulated by physical therapy when she had a syncopal episode, had to sit on wheelchair was wheeled back to the room and CODE BLUE initiated.  ROSC was obtained within 4 minutes, she was not protecting her airway, hence intubated by ED physician. CT angiogram chest showed acute multiple bilateral pulmonary emboli with RV/LV ratio 1.6. Venous duplex showed DVT in right lower extremity, right posterior tibial and right peroneal veins  PMH: Rheumatoid arthritis Peripheral neuropathy Fibromyalgia  Hospital follow-up visit today after 5 months. She has recovered well, she is ambulatory she complains of some stiffness in her right knee and occasional leg swelling. Breathing is back to normal She is compliant with Eliquis twice daily and denies any bleeding issues  Significant tests/ events reviewed  CTA 04/2022 multiple bilateral pulmonary emboli, RV/LV 1.6  Review of Systems neg for any significant sore throat, dysphagia, itching, sneezing, nasal congestion or excess/ purulent secretions, fever, chills, sweats, unintended wt loss, pleuritic or exertional cp, hempoptysis, orthopnea pnd or change in chronic leg swelling. Also denies presyncope, palpitations, heartburn, abdominal pain, nausea, vomiting, diarrhea or change in bowel or urinary habits, dysuria,hematuria, rash, arthralgias, visual complaints, headache, numbness weakness or ataxia.     Objective:   Physical Exam  Gen. Pleasant, obese, in no distress ENT - no lesions, no post nasal drip Neck: No JVD, no thyromegaly, no carotid bruits Lungs: no use of accessory muscles, no dullness to percussion, decreased  without rales or rhonchi  Cardiovascular: Rhythm regular, heart sounds  normal, no murmurs or gallops, no peripheral edema Musculoskeletal: No deformities, no cyanosis or clubbing , no tremors        Assessment & Plan:

## 2022-09-15 NOTE — Patient Instructions (Signed)
  Venous duplex of RLE in July Echo in July  Continue eliquis until end July
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
## 2022-09-20 ENCOUNTER — Encounter (HOSPITAL_COMMUNITY): Payer: Self-pay | Admitting: Physical Therapy

## 2022-09-20 ENCOUNTER — Ambulatory Visit (HOSPITAL_COMMUNITY): Payer: Medicare PPO | Attending: Orthopedic Surgery | Admitting: Physical Therapy

## 2022-09-20 DIAGNOSIS — M25561 Pain in right knee: Secondary | ICD-10-CM | POA: Diagnosis not present

## 2022-09-20 DIAGNOSIS — R2689 Other abnormalities of gait and mobility: Secondary | ICD-10-CM | POA: Insufficient documentation
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
## 2022-09-27 ENCOUNTER — Encounter (HOSPITAL_COMMUNITY): Payer: Medicare PPO | Admitting: Physical Therapy

## 2022-10-06 ENCOUNTER — Telehealth: Payer: Self-pay | Admitting: Pulmonary Disease

## 2022-10-06 DIAGNOSIS — I2699 Other pulmonary embolism without acute cor pulmonale: Secondary | ICD-10-CM

## 2022-10-06 NOTE — Telephone Encounter (Signed)
ATC patient. LVMTCB. 

## 2022-10-06 NOTE — Telephone Encounter (Signed)
Patient is returning phone call. Patient phone number is [phone].

## 2022-10-06 NOTE — Telephone Encounter (Signed)
Patient checking on test to be performed. Patient phone number is [phone].

## 2022-10-07 ENCOUNTER — Other Ambulatory Visit (HOSPITAL_BASED_OUTPATIENT_CLINIC_OR_DEPARTMENT_OTHER): Payer: Self-pay

## 2022-10-07 DIAGNOSIS — I2699 Other pulmonary embolism without acute cor pulmonale: Secondary | ICD-10-CM

## 2022-10-07 NOTE — Telephone Encounter (Signed)
Called and spoke with patient. She was calling to check on the status of the echo and dopplex RA mentioned at her last visit. I apologized to her because neither test had been ordered. Advised her that I would place the orders today and she would receive a call to get these scheduled. She verbalized understanding.   Nothing further needed at time of call.

## 2022-10-11 ENCOUNTER — Encounter (HOSPITAL_COMMUNITY): Payer: Medicare PPO | Admitting: Physical Therapy

## 2022-10-15 DIAGNOSIS — M9902 Segmental and somatic dysfunction of thoracic region: Secondary | ICD-10-CM | POA: Diagnosis not present

## 2022-10-15 DIAGNOSIS — M9901 Segmental and somatic dysfunction of cervical region: Secondary | ICD-10-CM | POA: Diagnosis not present

## 2022-10-15 DIAGNOSIS — M546 Pain in thoracic spine: Secondary | ICD-10-CM | POA: Diagnosis not present

## 2022-10-15 DIAGNOSIS — M9903 Segmental and somatic dysfunction of lumbar region: Secondary | ICD-10-CM | POA: Diagnosis not present

## 2022-10-15 DIAGNOSIS — M62830 Muscle spasm of back: Secondary | ICD-10-CM | POA: Diagnosis not present

## 2022-10-15 DIAGNOSIS — M542 Cervicalgia: Secondary | ICD-10-CM | POA: Diagnosis not present

## 2022-10-25 ENCOUNTER — Ambulatory Visit (INDEPENDENT_AMBULATORY_CARE_PROVIDER_SITE_OTHER): Payer: Medicare PPO | Admitting: Orthopedic Surgery

## 2022-10-25 DIAGNOSIS — M5431 Sciatica, right side: Secondary | ICD-10-CM

## 2022-10-25 DIAGNOSIS — Z96651 Presence of right artificial knee joint: Secondary | ICD-10-CM

## 2022-10-25 DIAGNOSIS — I82401 Acute embolism and thrombosis of unspecified deep veins of right lower extremity: Secondary | ICD-10-CM

## 2022-10-25 DIAGNOSIS — M1711 Unilateral primary osteoarthritis, right knee: Secondary | ICD-10-CM

## 2022-10-25 DIAGNOSIS — I2699 Other pulmonary embolism without acute cor pulmonale: Secondary | ICD-10-CM | POA: Diagnosis not present

## 2022-10-25 NOTE — Progress Notes (Signed)
Chief Complaint  Patient presents with   Routine Post Op    Post of right knee no pain doing well    Encounter Diagnoses  Name Primary?   S/P TKR (total knee replacement), right 05/11/22 Yes   Sciatica of right side    Unilateral primary osteoarthritis, right knee    Leg DVT (deep venous thromboembolism), acute, right (HCC)    Acute pulmonary embolism without acute cor pulmonale, unspecified pulmonary embolism type [facility])     Status post right total knee May 11, 2022  Patient also has a left total knee done in 2017 at [facility]  Patient developed a postop DVT and pulmonary embolus during her knee replacement surgery in January of this year but she has made full recovery she is finished therapy she is having no significant discomfort at this time her sciatica which developed postoperatively has resolved  Her range of motion is excellent 6 months Her Swelling has gone down her knee swelling is gone down  Recommend follow-up in 6 months

## 2022-10-27 ENCOUNTER — Ambulatory Visit (HOSPITAL_COMMUNITY)
Admission: RE | Admit: 2022-10-27 | Discharge: 2022-10-27 | Disposition: A | Discharge status: Home / Self Care | Payer: Medicare PPO | Source: Ambulatory Visit | Attending: Internal Medicine | Admitting: Internal Medicine

## 2022-10-27 DIAGNOSIS — Z1231 Encounter for screening mammogram for malignant neoplasm of breast: Secondary | ICD-10-CM | POA: Insufficient documentation

## 2022-11-04 DIAGNOSIS — Z6831 Body mass index (BMI) 31.0-31.9, adult: Secondary | ICD-10-CM | POA: Diagnosis not present

## 2022-11-04 DIAGNOSIS — R030 Elevated blood-pressure reading, without diagnosis of hypertension: Secondary | ICD-10-CM | POA: Diagnosis not present

## 2022-11-04 DIAGNOSIS — E669 Obesity, unspecified: Secondary | ICD-10-CM | POA: Diagnosis not present

## 2022-11-04 DIAGNOSIS — L738 Other specified follicular disorders: Secondary | ICD-10-CM | POA: Diagnosis not present

## 2022-11-10 DIAGNOSIS — Z79899 Other long term (current) drug therapy: Secondary | ICD-10-CM | POA: Diagnosis not present

## 2022-11-10 DIAGNOSIS — E785 Hyperlipidemia, unspecified: Secondary | ICD-10-CM | POA: Diagnosis not present

## 2022-11-10 DIAGNOSIS — E119 Type 2 diabetes mellitus without complications: Secondary | ICD-10-CM | POA: Diagnosis not present

## 2022-11-12 DIAGNOSIS — M9903 Segmental and somatic dysfunction of lumbar region: Secondary | ICD-10-CM | POA: Diagnosis not present

## 2022-11-12 DIAGNOSIS — M62830 Muscle spasm of back: Secondary | ICD-10-CM | POA: Diagnosis not present

## 2022-11-12 DIAGNOSIS — M546 Pain in thoracic spine: Secondary | ICD-10-CM | POA: Diagnosis not present

## 2022-11-12 DIAGNOSIS — M9901 Segmental and somatic dysfunction of cervical region: Secondary | ICD-10-CM | POA: Diagnosis not present

## 2022-11-12 DIAGNOSIS — M9902 Segmental and somatic dysfunction of thoracic region: Secondary | ICD-10-CM | POA: Diagnosis not present

## 2022-11-12 DIAGNOSIS — M542 Cervicalgia: Secondary | ICD-10-CM | POA: Diagnosis not present

## 2022-11-22 ENCOUNTER — Ambulatory Visit (HOSPITAL_COMMUNITY)
Admission: RE | Admit: 2022-11-22 | Discharge: 2022-11-22 | Disposition: A | Discharge status: Home / Self Care | Payer: Medicare PPO | Source: Ambulatory Visit | Attending: Pulmonary Disease | Admitting: Pulmonary Disease

## 2022-11-22 DIAGNOSIS — Z86718 Personal history of other venous thrombosis and embolism: Secondary | ICD-10-CM | POA: Insufficient documentation

## 2022-11-22 DIAGNOSIS — I2699 Other pulmonary embolism without acute cor pulmonale: Secondary | ICD-10-CM

## 2022-11-22 DIAGNOSIS — Z8674 Personal history of sudden cardiac arrest: Secondary | ICD-10-CM | POA: Diagnosis not present

## 2022-11-22 DIAGNOSIS — I513 Intracardiac thrombosis, not elsewhere classified: Secondary | ICD-10-CM | POA: Diagnosis not present

## 2022-11-22 DIAGNOSIS — Z86711 Personal history of pulmonary embolism: Secondary | ICD-10-CM | POA: Insufficient documentation

## 2022-11-22 DIAGNOSIS — I499 Cardiac arrhythmia, unspecified: Secondary | ICD-10-CM | POA: Diagnosis not present

## 2022-11-22 DIAGNOSIS — I2609 Other pulmonary embolism with acute cor pulmonale: Secondary | ICD-10-CM | POA: Diagnosis not present

## 2022-11-22 LAB — ECHOCARDIOGRAM COMPLETE
Area-P 1/2: 2.75 cm2
Calc EF: 67.3 %
Single Plane A2C EF: 68.3 %
Single Plane A4C EF: 67.5 %

## 2022-11-22 NOTE — Progress Notes (Signed)
  Echocardiogram 2D Echocardiogram has been performed.  Whitney Moreno 11/22/2022, 11:07 AM

## 2022-12-10 DIAGNOSIS — M9903 Segmental and somatic dysfunction of lumbar region: Secondary | ICD-10-CM | POA: Diagnosis not present

## 2022-12-10 DIAGNOSIS — M9901 Segmental and somatic dysfunction of cervical region: Secondary | ICD-10-CM | POA: Diagnosis not present

## 2022-12-10 DIAGNOSIS — M62830 Muscle spasm of back: Secondary | ICD-10-CM | POA: Diagnosis not present

## 2022-12-10 DIAGNOSIS — Z79899 Other long term (current) drug therapy: Secondary | ICD-10-CM | POA: Diagnosis not present

## 2022-12-10 DIAGNOSIS — M0609 Rheumatoid arthritis without rheumatoid factor, multiple sites: Secondary | ICD-10-CM | POA: Diagnosis not present

## 2022-12-10 DIAGNOSIS — M542 Cervicalgia: Secondary | ICD-10-CM | POA: Diagnosis not present

## 2022-12-10 DIAGNOSIS — M546 Pain in thoracic spine: Secondary | ICD-10-CM | POA: Diagnosis not present

## 2022-12-10 DIAGNOSIS — M9902 Segmental and somatic dysfunction of thoracic region: Secondary | ICD-10-CM | POA: Diagnosis not present

## 2022-12-21 DIAGNOSIS — R768 Other specified abnormal immunological findings in serum: Secondary | ICD-10-CM | POA: Diagnosis not present

## 2022-12-21 DIAGNOSIS — M1991 Primary osteoarthritis, unspecified site: Secondary | ICD-10-CM | POA: Diagnosis not present

## 2022-12-21 DIAGNOSIS — M7551 Bursitis of right shoulder: Secondary | ICD-10-CM | POA: Diagnosis not present

## 2022-12-21 DIAGNOSIS — E669 Obesity, unspecified: Secondary | ICD-10-CM | POA: Diagnosis not present

## 2022-12-21 DIAGNOSIS — Z79899 Other long term (current) drug therapy: Secondary | ICD-10-CM | POA: Diagnosis not present

## 2022-12-21 DIAGNOSIS — M5136 Other intervertebral disc degeneration, lumbar region: Secondary | ICD-10-CM | POA: Diagnosis not present

## 2022-12-21 DIAGNOSIS — Z6832 Body mass index (BMI) 32.0-32.9, adult: Secondary | ICD-10-CM | POA: Diagnosis not present

## 2022-12-21 DIAGNOSIS — M797 Fibromyalgia: Secondary | ICD-10-CM | POA: Diagnosis not present

## 2022-12-21 DIAGNOSIS — M0609 Rheumatoid arthritis without rheumatoid factor, multiple sites: Secondary | ICD-10-CM | POA: Diagnosis not present

## 2022-12-23 ENCOUNTER — Ambulatory Visit (INDEPENDENT_AMBULATORY_CARE_PROVIDER_SITE_OTHER): Payer: Medicare PPO | Admitting: Pulmonary Disease

## 2022-12-23 ENCOUNTER — Encounter: Payer: Self-pay | Admitting: Pulmonary Disease

## 2022-12-23 VITALS — BP 110/68 | HR 79 | Ht 66.0 in | Wt 203.0 lb

## 2022-12-23 DIAGNOSIS — I2699 Other pulmonary embolism without acute cor pulmonale: Secondary | ICD-10-CM | POA: Diagnosis not present

## 2022-12-23 NOTE — Progress Notes (Signed)
   Subjective:    Patient ID: Whitney Moreno, female    DOB: 10-12-1950, 72 y.o.   MRN: [id number]  HPI 72 year old woman who had submassive PE/DVT in January 2024 status post right knee arthroplasty, complicated by brief cardiac arrest  PMH: Rheumatoid arthritis Peripheral neuropathy Fibromyalgia  92-month follow-up visit. She has recovered well.  We repeated venous duplex which showed that RLE DVT has resolved.  Echo showed that RV function had normalized She denies shortness of breath or leg swelling  Significant tests/ events reviewed   CTA 04/2022 multiple bilateral pulmonary emboli, RV/LV 1.6  Review of Systems neg for any significant sore throat, dysphagia, itching, sneezing, nasal congestion or excess/ purulent secretions, fever, chills, sweats, unintended wt loss, pleuritic or exertional cp, hempoptysis, orthopnea pnd or change in chronic leg swelling. Also denies presyncope, palpitations, heartburn, abdominal pain, nausea, vomiting, diarrhea or change in bowel or urinary habits, dysuria,hematuria, rash, arthralgias, visual complaints, headache, numbness weakness or ataxia.     Objective:   Physical Exam  Gen. Pleasant, obese, in no distress ENT - no lesions, no post nasal drip Neck: No JVD, no thyromegaly, no carotid bruits Lungs: no use of accessory muscles, no dullness to percussion, decreased without rales or rhonchi  Cardiovascular: Rhythm regular, heart sounds  normal, no murmurs or gallops, no peripheral edema Musculoskeletal: No deformities, no cyanosis or clubbing , no tremors        Assessment & Plan:
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
## 2022-12-23 NOTE — Patient Instructions (Signed)
Take a baby aspirin daily until end of the year

## 2023-01-17 DIAGNOSIS — L662 Folliculitis decalvans: Secondary | ICD-10-CM | POA: Diagnosis not present

## 2023-01-21 DIAGNOSIS — M62830 Muscle spasm of back: Secondary | ICD-10-CM | POA: Diagnosis not present

## 2023-01-21 DIAGNOSIS — M9903 Segmental and somatic dysfunction of lumbar region: Secondary | ICD-10-CM | POA: Diagnosis not present

## 2023-01-21 DIAGNOSIS — M9901 Segmental and somatic dysfunction of cervical region: Secondary | ICD-10-CM | POA: Diagnosis not present

## 2023-01-21 DIAGNOSIS — M542 Cervicalgia: Secondary | ICD-10-CM | POA: Diagnosis not present

## 2023-01-21 DIAGNOSIS — M9902 Segmental and somatic dysfunction of thoracic region: Secondary | ICD-10-CM | POA: Diagnosis not present

## 2023-01-21 DIAGNOSIS — M546 Pain in thoracic spine: Secondary | ICD-10-CM | POA: Diagnosis not present

## 2023-01-31 ENCOUNTER — Telehealth (HOSPITAL_BASED_OUTPATIENT_CLINIC_OR_DEPARTMENT_OTHER): Payer: Self-pay | Admitting: *Deleted

## 2023-01-31 ENCOUNTER — Ambulatory Visit (HOSPITAL_BASED_OUTPATIENT_CLINIC_OR_DEPARTMENT_OTHER): Payer: Medicare PPO | Admitting: Obstetrics & Gynecology

## 2023-01-31 NOTE — Telephone Encounter (Signed)
Called patient and she no longer needs  these appointment.

## 2023-02-14 ENCOUNTER — Other Ambulatory Visit: Payer: Self-pay | Admitting: Neurology

## 2023-02-18 DIAGNOSIS — M62830 Muscle spasm of back: Secondary | ICD-10-CM | POA: Diagnosis not present

## 2023-02-18 DIAGNOSIS — M546 Pain in thoracic spine: Secondary | ICD-10-CM | POA: Diagnosis not present

## 2023-02-18 DIAGNOSIS — M9903 Segmental and somatic dysfunction of lumbar region: Secondary | ICD-10-CM | POA: Diagnosis not present

## 2023-02-18 DIAGNOSIS — M542 Cervicalgia: Secondary | ICD-10-CM | POA: Diagnosis not present

## 2023-02-18 DIAGNOSIS — M9901 Segmental and somatic dysfunction of cervical region: Secondary | ICD-10-CM | POA: Diagnosis not present

## 2023-02-18 DIAGNOSIS — M9902 Segmental and somatic dysfunction of thoracic region: Secondary | ICD-10-CM | POA: Diagnosis not present

## 2023-03-09 DIAGNOSIS — B9689 Other specified bacterial agents as the cause of diseases classified elsewhere: Secondary | ICD-10-CM | POA: Diagnosis not present

## 2023-03-09 DIAGNOSIS — L02821 Furuncle of head [any part, except face]: Secondary | ICD-10-CM | POA: Diagnosis not present

## 2023-03-21 ENCOUNTER — Other Ambulatory Visit: Payer: Self-pay | Admitting: Internal Medicine

## 2023-03-21 NOTE — Telephone Encounter (Signed)
Pt needs an appt for further  refills 

## 2023-03-25 DIAGNOSIS — M9903 Segmental and somatic dysfunction of lumbar region: Secondary | ICD-10-CM | POA: Diagnosis not present

## 2023-03-25 DIAGNOSIS — M9901 Segmental and somatic dysfunction of cervical region: Secondary | ICD-10-CM | POA: Diagnosis not present

## 2023-03-25 DIAGNOSIS — M62830 Muscle spasm of back: Secondary | ICD-10-CM | POA: Diagnosis not present

## 2023-03-25 DIAGNOSIS — M9902 Segmental and somatic dysfunction of thoracic region: Secondary | ICD-10-CM | POA: Diagnosis not present

## 2023-03-25 DIAGNOSIS — M542 Cervicalgia: Secondary | ICD-10-CM | POA: Diagnosis not present

## 2023-03-25 DIAGNOSIS — M546 Pain in thoracic spine: Secondary | ICD-10-CM | POA: Diagnosis not present

## 2023-04-22 DIAGNOSIS — M0609 Rheumatoid arthritis without rheumatoid factor, multiple sites: Secondary | ICD-10-CM | POA: Diagnosis not present

## 2023-04-28 NOTE — Progress Notes (Signed)
   LMP 02/16/2008   There is no height or weight on file to calculate BMI.  Chief Complaint  Patient presents with   Post-op Follow-up    Right knee 05/11/22    Encounter Diagnoses  Name Primary?   S/P TKR (total knee replacement), right 05/11/22 Yes   Unilateral primary osteoarthritis, right knee     DOI/DOS/ Date: 05/11/22  Resolved

## 2023-04-29 DIAGNOSIS — M9902 Segmental and somatic dysfunction of thoracic region: Secondary | ICD-10-CM | POA: Diagnosis not present

## 2023-04-29 DIAGNOSIS — M546 Pain in thoracic spine: Secondary | ICD-10-CM | POA: Diagnosis not present

## 2023-04-29 DIAGNOSIS — M9901 Segmental and somatic dysfunction of cervical region: Secondary | ICD-10-CM | POA: Diagnosis not present

## 2023-04-29 DIAGNOSIS — M9903 Segmental and somatic dysfunction of lumbar region: Secondary | ICD-10-CM | POA: Diagnosis not present

## 2023-04-29 DIAGNOSIS — M62830 Muscle spasm of back: Secondary | ICD-10-CM | POA: Diagnosis not present

## 2023-04-29 DIAGNOSIS — M542 Cervicalgia: Secondary | ICD-10-CM | POA: Diagnosis not present

## 2023-05-02 ENCOUNTER — Ambulatory Visit: Payer: Medicare PPO | Admitting: Orthopedic Surgery

## 2023-05-02 ENCOUNTER — Encounter: Payer: Self-pay | Admitting: Orthopedic Surgery

## 2023-05-02 ENCOUNTER — Other Ambulatory Visit (INDEPENDENT_AMBULATORY_CARE_PROVIDER_SITE_OTHER): Payer: Medicare PPO

## 2023-05-02 DIAGNOSIS — I2699 Other pulmonary embolism without acute cor pulmonale: Secondary | ICD-10-CM

## 2023-05-02 DIAGNOSIS — M1711 Unilateral primary osteoarthritis, right knee: Secondary | ICD-10-CM

## 2023-05-02 DIAGNOSIS — Z96651 Presence of right artificial knee joint: Secondary | ICD-10-CM

## 2023-05-02 NOTE — Progress Notes (Signed)
 FOLLOW-UP OFFICE VISIT   Patient: Whitney Moreno           Date of Birth: 12-02-1950           MRN: [id number] Visit Date: 05/02/2023 Requested by: Sheryle Carwin, MD [address] [place],  [place] [address] PCP: Sheryle Carwin, MD    Encounter Diagnoses  Name Primary?   S/P TKR (total knee replacement), right 05/11/22 Yes   Unilateral primary osteoarthritis, right knee    Acute pulmonary embolism without acute cor pulmonale, unspecified pulmonary embolism type [facility])     Chief Complaint  Patient presents with   Post-op Follow-up    Right knee 05/11/22    LMP 02/16/2008   There is no height or weight on file to calculate BMI.  Chief Complaint  Patient presents with   Post-op Follow-up    Right knee 05/11/22    Encounter Diagnoses  Name Primary?   S/P TKR (total knee replacement), right 05/11/22 Yes   Unilateral primary osteoarthritis, right knee     DOI/DOS/ Date: 05/11/22  Resolved  0-115 ROM   Good strength  No cane   DG Knee AP/LAT W/Sunrise Right Result Date: 05/02/2023 X-ray report Chief complaint 1 yr fu tka right knee Images 3 Reading: alignment normal No lossening Impression: stable knee no loosening    PE no longer on anticoags   ASSESSMENT AND PLAN Stable doing well fu 2 yr

## 2023-05-27 DIAGNOSIS — M546 Pain in thoracic spine: Secondary | ICD-10-CM | POA: Diagnosis not present

## 2023-05-27 DIAGNOSIS — M62830 Muscle spasm of back: Secondary | ICD-10-CM | POA: Diagnosis not present

## 2023-05-27 DIAGNOSIS — M9902 Segmental and somatic dysfunction of thoracic region: Secondary | ICD-10-CM | POA: Diagnosis not present

## 2023-05-27 DIAGNOSIS — M9901 Segmental and somatic dysfunction of cervical region: Secondary | ICD-10-CM | POA: Diagnosis not present

## 2023-05-27 DIAGNOSIS — M542 Cervicalgia: Secondary | ICD-10-CM | POA: Diagnosis not present

## 2023-05-27 DIAGNOSIS — M9903 Segmental and somatic dysfunction of lumbar region: Secondary | ICD-10-CM | POA: Diagnosis not present

## 2023-06-13 IMAGING — CT CT RENAL STONE PROTOCOL
2 of 4 series · 15 of 46 positions shown, 17 images · non-contrast
Comparison: CT the abdomen and pelvis 08/12/2013.

CLINICAL DATA: 70-year-old female with history of left-sided lower
back and flank pain.



[Series 2: axial st · axial · 0.66mm/px · z∈[+945,+1350]mm · 12 of 89 slices shown, 14 images]
[im 4/89  soft-tissue]
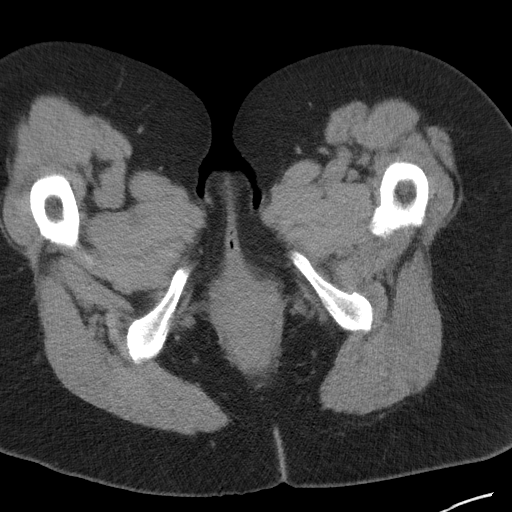
[im 4/89  bone]
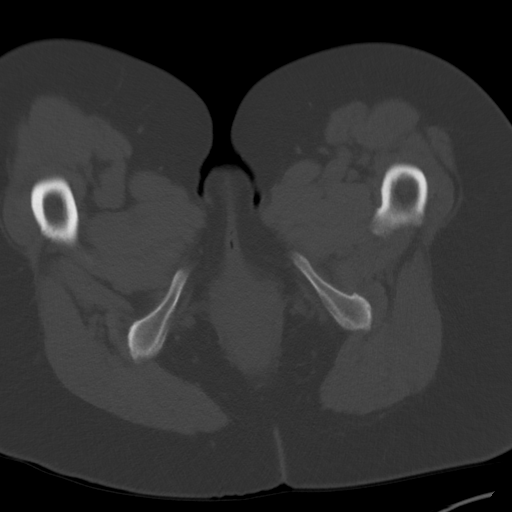
[im 12/89  soft-tissue]
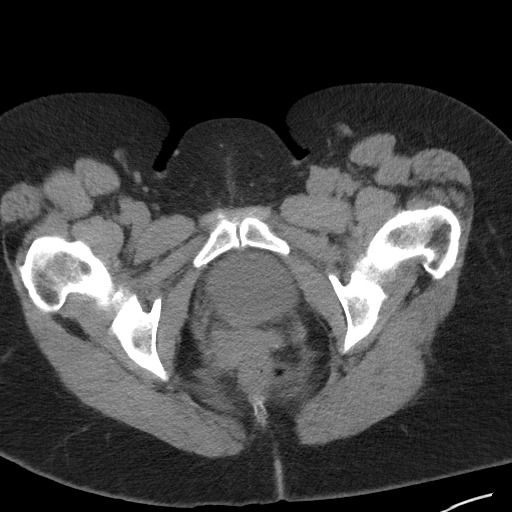
[im 19/89  soft-tissue]
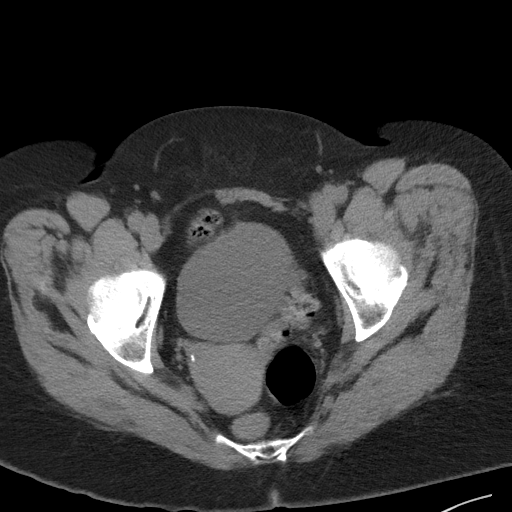
[im 26/89  soft-tissue]
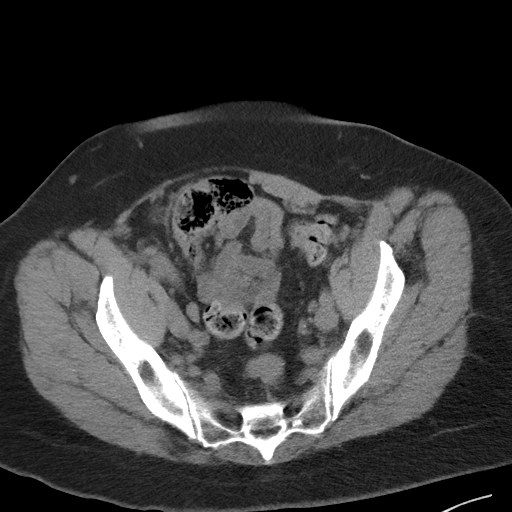
[im 34/89  soft-tissue]
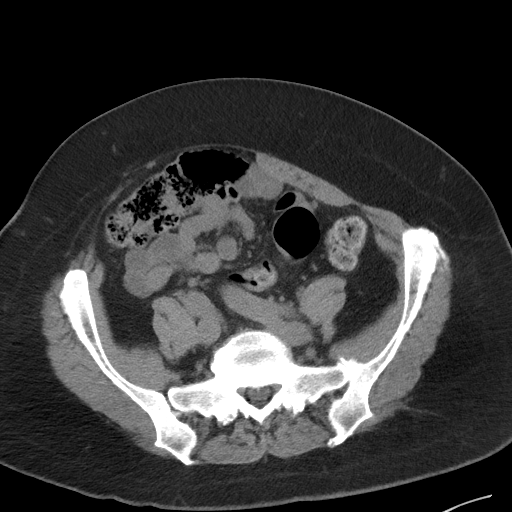
[im 41/89  soft-tissue]
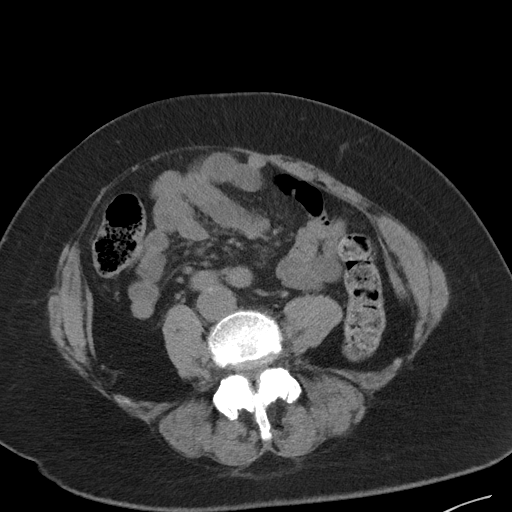
[im 48/89  soft-tissue]
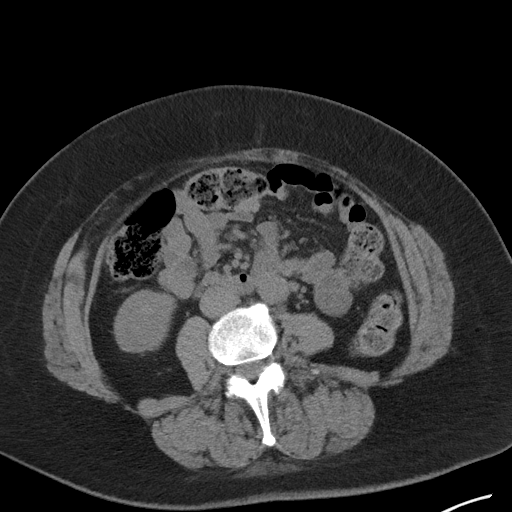
[im 56/89  soft-tissue]
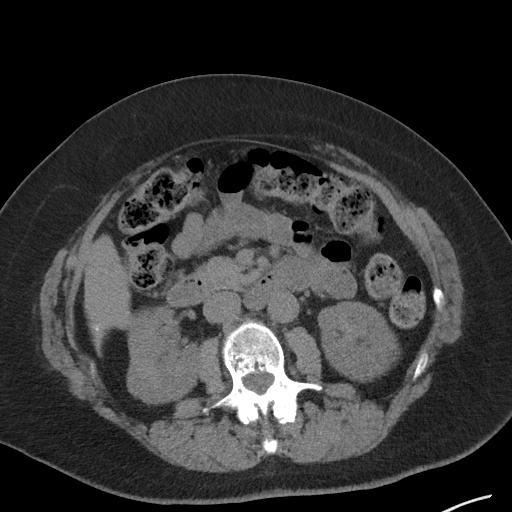
[im 63/89  soft-tissue]
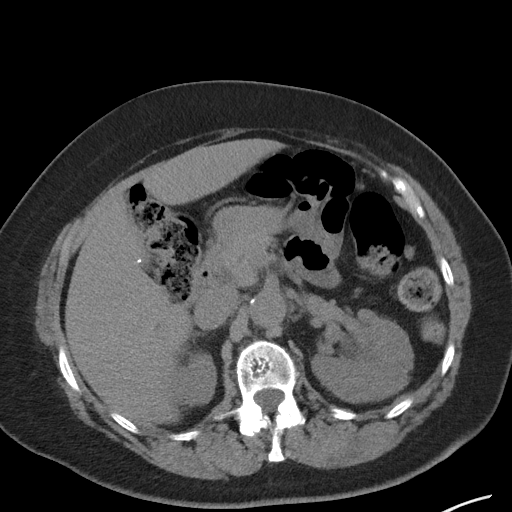
[im 63/89  bone]
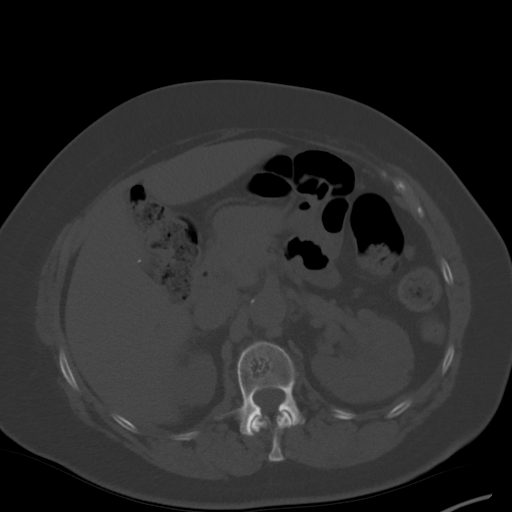
[im 70/89  soft-tissue]
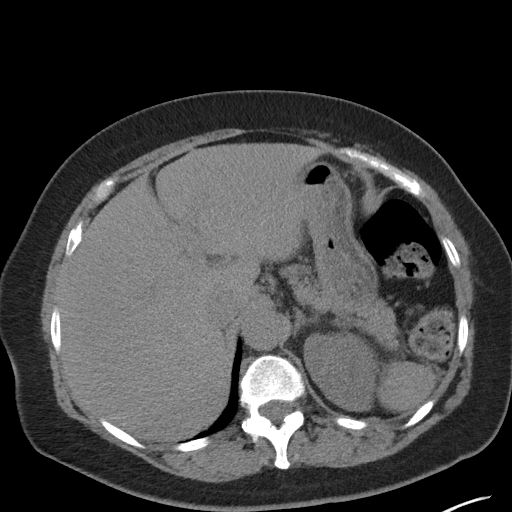
[im 78/89  soft-tissue]
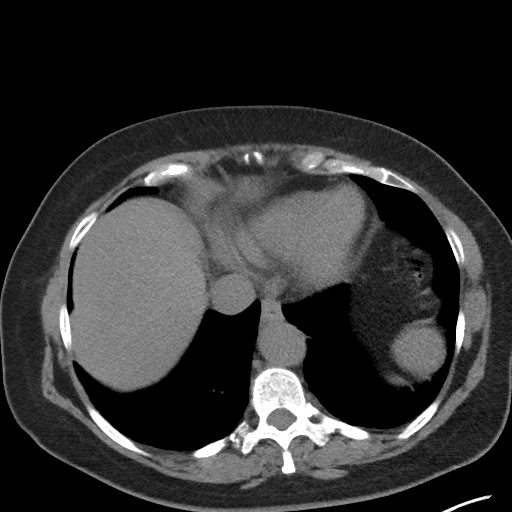
[im 85/89  soft-tissue]
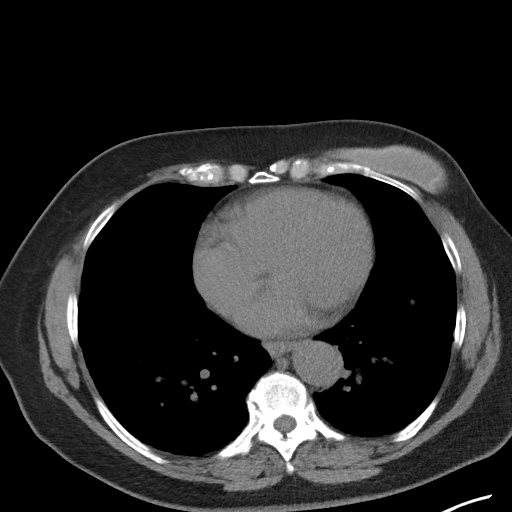

[Series 5: coronal st · coronal · 0.75mm/px · 3 of 88 slices shown]
[im 30/88  soft-tissue]
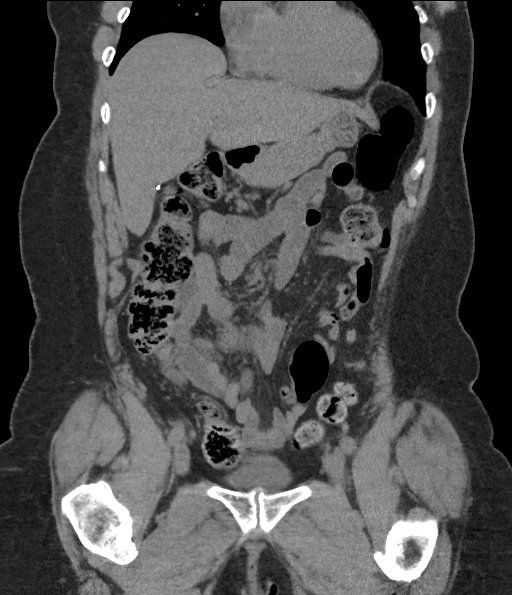
[im 39/88  soft-tissue]
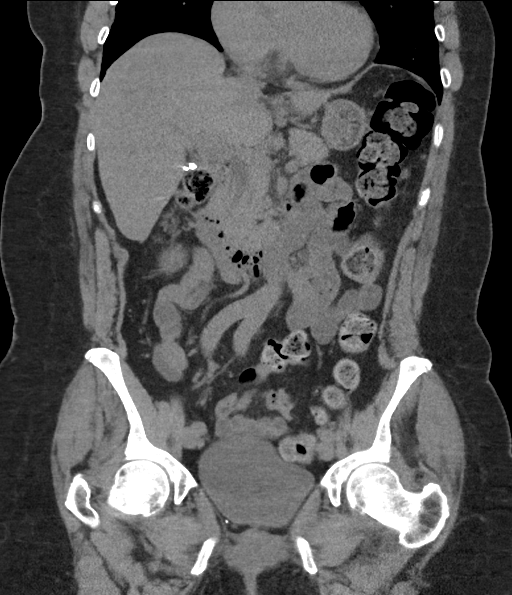
[im 49/88  soft-tissue]
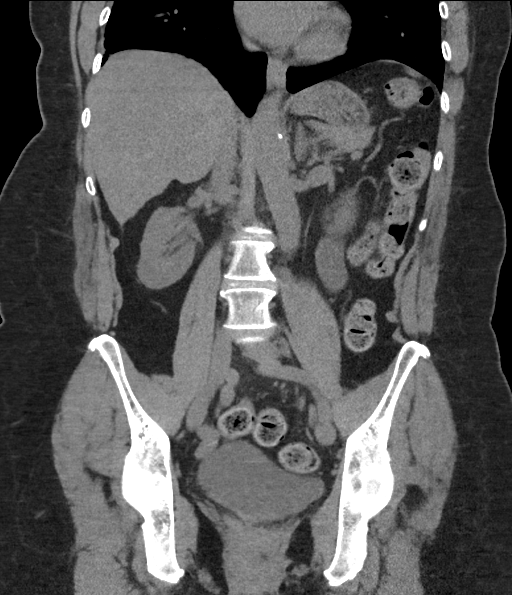

[15 of 46 positions shown; findings below may reference images not displayed]

FINDINGS: Lower chest: Bilateral breast implants incidentally noted.
Atherosclerotic calcifications in the descending thoracic aorta.

Hepatobiliary: No definite suspicious cystic or solid hepatic
lesions are confidently identified on today's noncontrast CT
examination. Status post cholecystectomy.

Pancreas: No definite pancreatic mass or peripancreatic fluid
collections or inflammatory changes are noted on today's noncontrast
CT examination.

Spleen: Spleen is diminutive in size, but otherwise unremarkable in
appearance.

Adrenals/Urinary Tract: Nonobstructive calculi are noted within both
renal collecting systems, largest of which is in the interpolar
collecting system of the left kidney measuring up to 4 mm. No
additional calculi are confidently identified along the course of
either ureter or within the lumen of the urinary bladder. Fullness
in the left renal collecting system is very similar to prior CT
examination from 08/12/2013, potentially related to small parapelvic
cysts. No frank hydroureteronephrosis is identified at this time.
Unenhanced unenhanced appearance of the right kidney, bilateral
adrenal glands and urinary bladder is otherwise unremarkable.

Stomach/Bowel: The appearance of the stomach is normal. There is no
pathologic dilatation of small bowel or colon. The appendix is not
confidently identified and may be surgically absent. Regardless,
there are no inflammatory changes noted adjacent to the cecum to
suggest the presence of an acute appendicitis at this time.

Vascular/Lymphatic: Atherosclerosis in the abdominal and pelvic
vasculature. No lymphadenopathy noted in the abdomen or pelvis.

Reproductive: Uterus and ovaries are unremarkable in appearance.

Other: No significant volume of ascites.  No pneumoperitoneum.

Musculoskeletal: There are no aggressive appearing lytic or blastic
lesions noted in the visualized portions of the skeleton.
IMPRESSION: 1. Nonobstructive calculi in the collecting systems of both kidneys
measuring up to 4 mm in the interpolar collecting system of left
kidney. No ureteral stones or findings of urinary tract obstruction
are noted at this time.
2. Aortic atherosclerosis.
3. Additional incidental findings, as above.

## 2023-06-13 IMAGING — DX DG KNEE COMPLETE 4+V*R*
4 series · 4 of 4 positions shown · non-contrast
Comparison: No priors.

CLINICAL DATA: 70-year-old female with history of right knee pain.

EXAM:
RIGHT KNEE - COMPLETE 4+ VIEW

[knee ap]
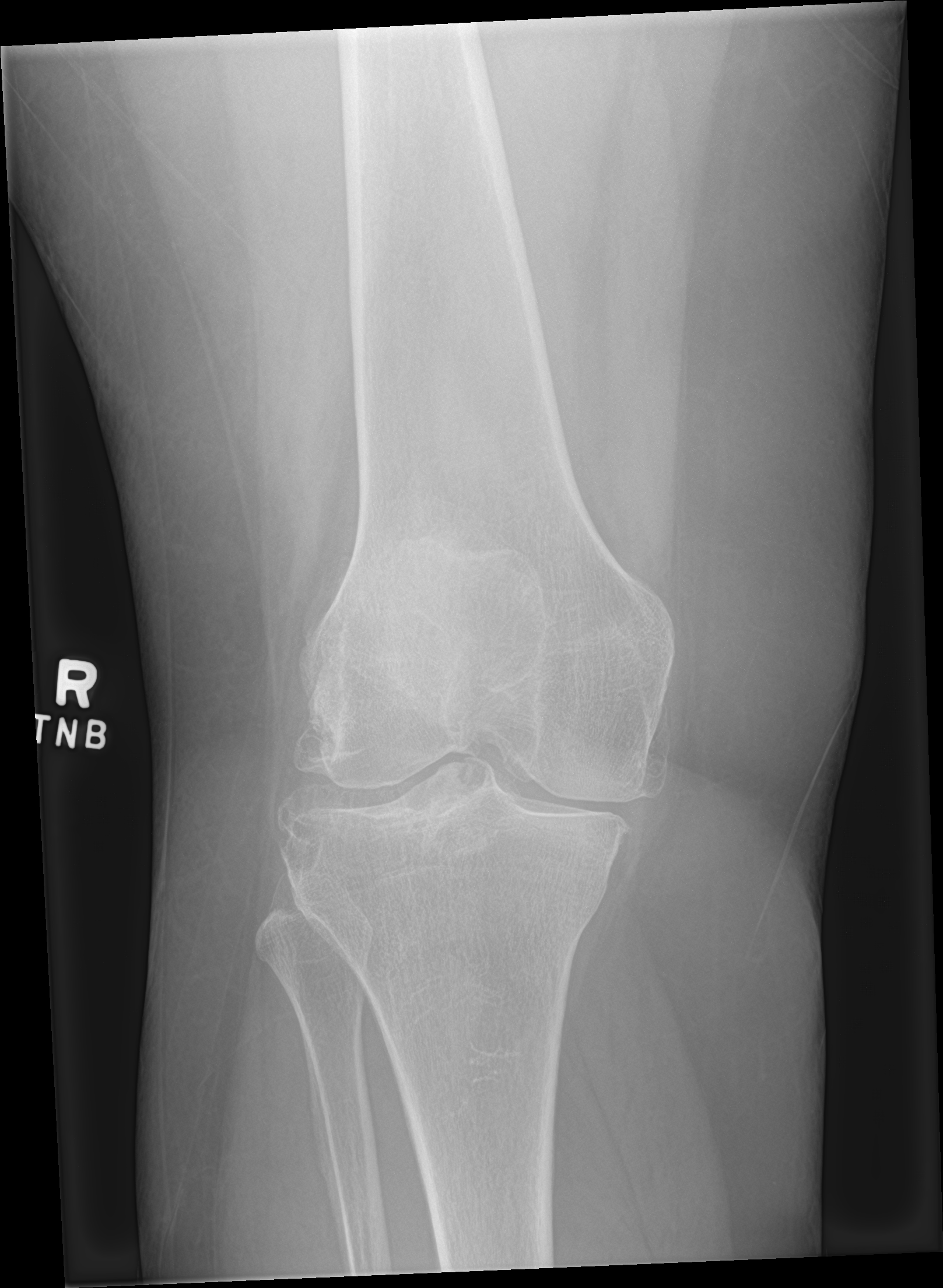

[knee obl (1 of 2)]
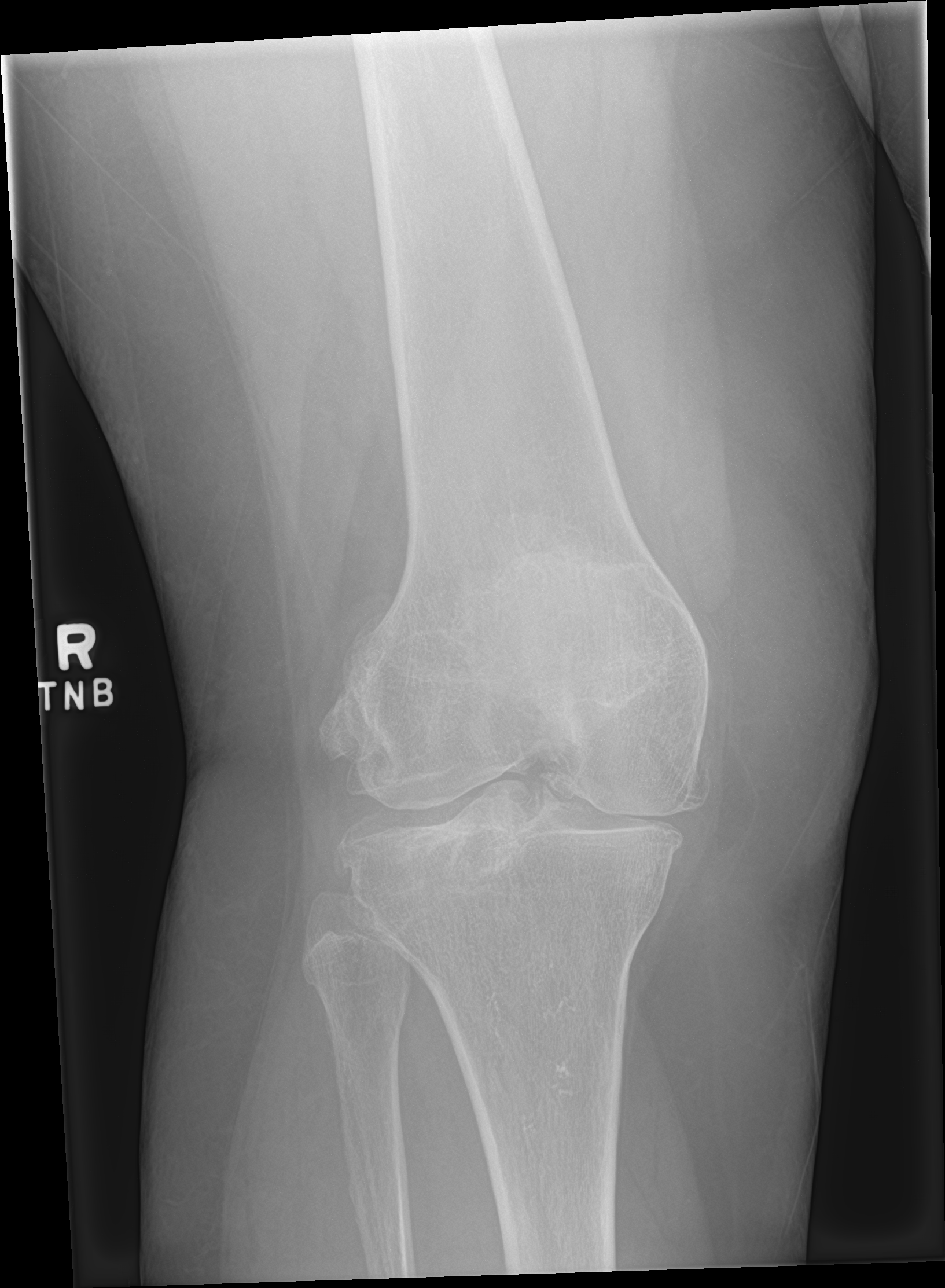

[knee obl (2 of 2)]
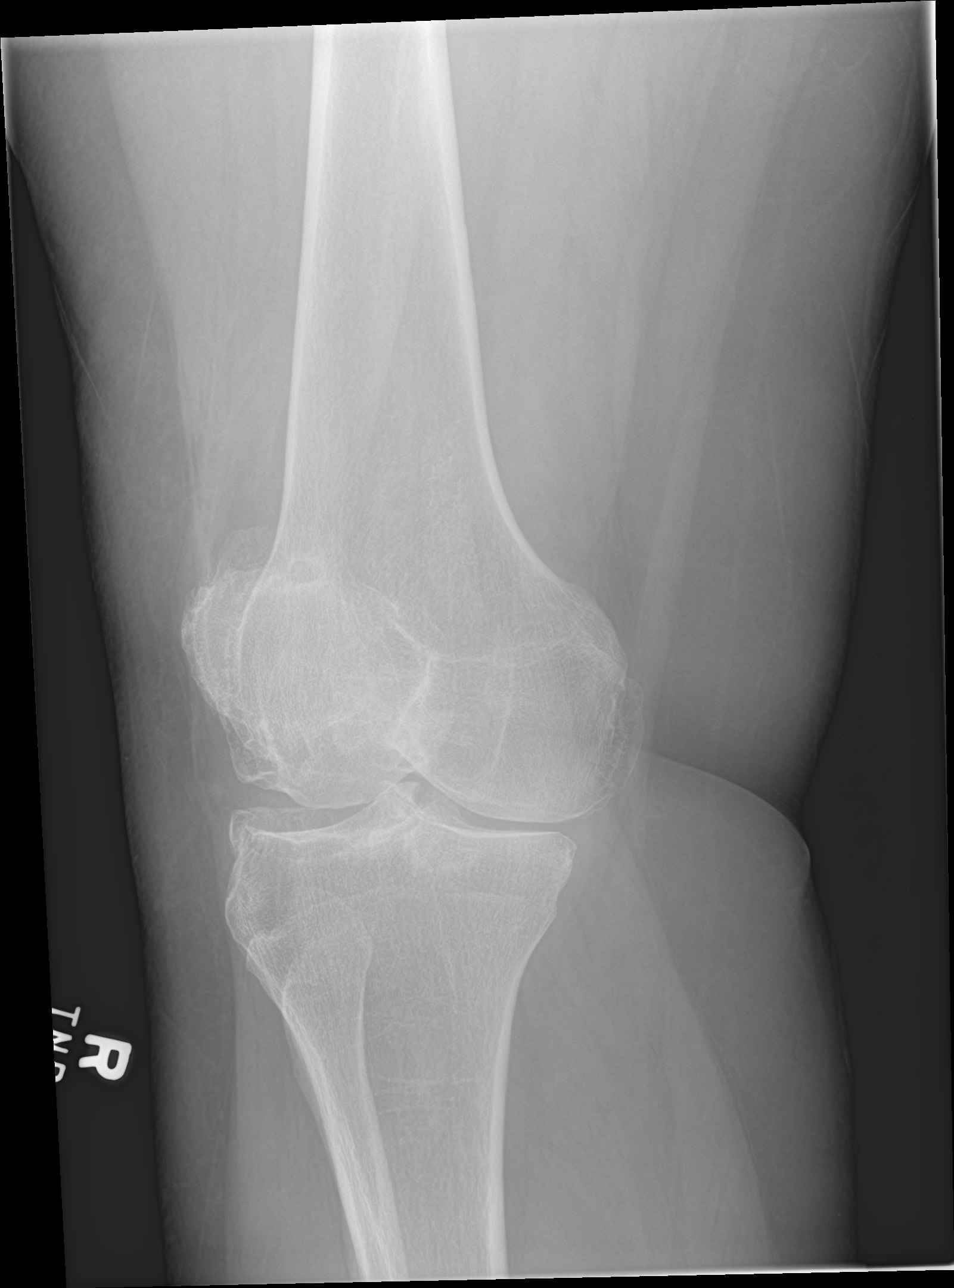

[knee lat]
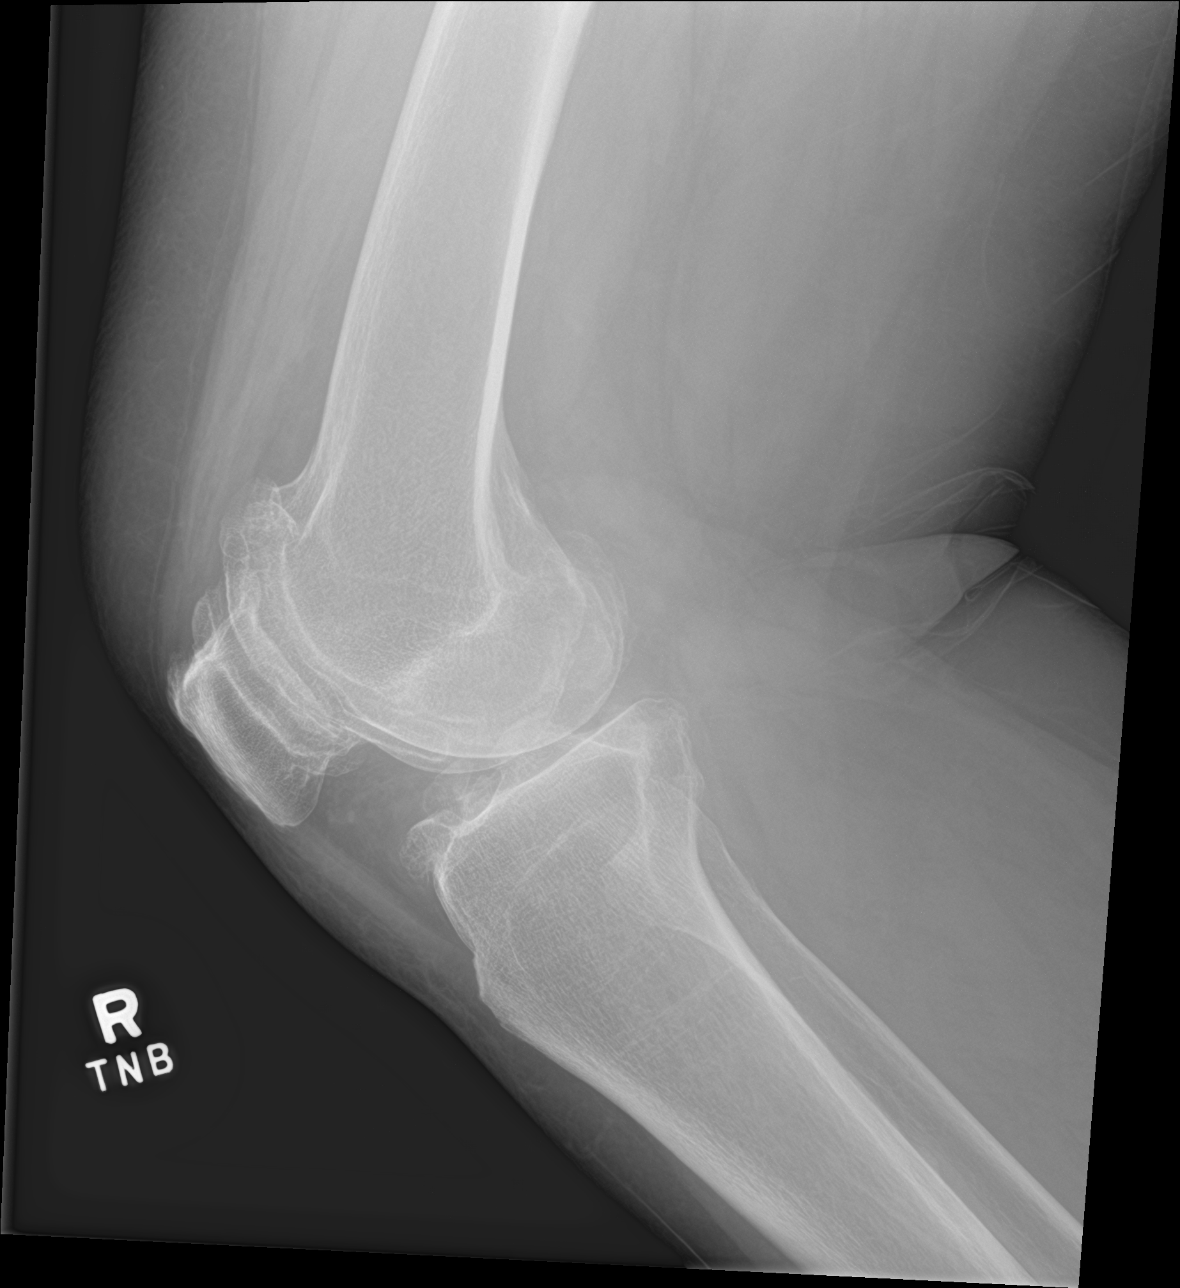

[4 of 4 positions shown; findings below may reference images not displayed]

FINDINGS: Four views of the right knee demonstrate no acute displaced
fracture, subluxation or dislocation. There is advanced joint space
narrowing, subchondral sclerosis, subchondral cyst formation and
osteophyte formation in a tricompartmental distribution, most severe
in the patellofemoral compartment, indicative of severe
osteoarthritis.
IMPRESSION: 1. No acute radiographic abnormality of the right knee.
2. Severe tricompartmental osteoarthritis, most severe in the
patellofemoral compartment.

## 2023-06-18 DIAGNOSIS — N760 Acute vaginitis: Secondary | ICD-10-CM | POA: Diagnosis not present

## 2023-06-23 DIAGNOSIS — M25511 Pain in right shoulder: Secondary | ICD-10-CM | POA: Diagnosis not present

## 2023-06-23 DIAGNOSIS — M5410 Radiculopathy, site unspecified: Secondary | ICD-10-CM | POA: Diagnosis not present

## 2023-06-23 DIAGNOSIS — M7551 Bursitis of right shoulder: Secondary | ICD-10-CM | POA: Diagnosis not present

## 2023-06-23 DIAGNOSIS — M1991 Primary osteoarthritis, unspecified site: Secondary | ICD-10-CM | POA: Diagnosis not present

## 2023-06-23 DIAGNOSIS — M797 Fibromyalgia: Secondary | ICD-10-CM | POA: Diagnosis not present

## 2023-06-23 DIAGNOSIS — Z79899 Other long term (current) drug therapy: Secondary | ICD-10-CM | POA: Diagnosis not present

## 2023-06-23 DIAGNOSIS — M0609 Rheumatoid arthritis without rheumatoid factor, multiple sites: Secondary | ICD-10-CM | POA: Diagnosis not present

## 2023-06-23 DIAGNOSIS — R768 Other specified abnormal immunological findings in serum: Secondary | ICD-10-CM | POA: Diagnosis not present

## 2023-06-23 DIAGNOSIS — G8929 Other chronic pain: Secondary | ICD-10-CM | POA: Diagnosis not present

## 2023-06-24 ENCOUNTER — Other Ambulatory Visit (HOSPITAL_COMMUNITY)
Admission: RE | Admit: 2023-06-24 | Discharge: 2023-06-24 | Disposition: A | Discharge status: Home / Self Care | Source: Ambulatory Visit | Attending: Obstetrics & Gynecology | Admitting: Obstetrics & Gynecology

## 2023-06-24 ENCOUNTER — Ambulatory Visit (HOSPITAL_BASED_OUTPATIENT_CLINIC_OR_DEPARTMENT_OTHER): Admitting: Obstetrics & Gynecology

## 2023-06-24 ENCOUNTER — Encounter (HOSPITAL_BASED_OUTPATIENT_CLINIC_OR_DEPARTMENT_OTHER): Payer: Self-pay | Admitting: Obstetrics & Gynecology

## 2023-06-24 VITALS — BP 136/81 | HR 78 | Ht 66.25 in | Wt 206.6 lb

## 2023-06-24 DIAGNOSIS — N9489 Other specified conditions associated with female genital organs and menstrual cycle: Secondary | ICD-10-CM

## 2023-06-24 DIAGNOSIS — R300 Dysuria: Secondary | ICD-10-CM | POA: Diagnosis not present

## 2023-06-24 LAB — POCT URINALYSIS DIPSTICK
Bilirubin, UA: NEGATIVE
Blood, UA: NEGATIVE
Glucose, UA: NEGATIVE
Ketones, UA: NEGATIVE
Leukocytes, UA: NEGATIVE
Nitrite, UA: NEGATIVE
Protein, UA: NEGATIVE
Spec Grav, UA: 1.02 (ref 1.010–1.025)
Urobilinogen, UA: 0.2 U/dL
pH, UA: 6 (ref 5.0–8.0)

## 2023-06-24 MED ORDER — FLUCONAZOLE 150 MG PO TABS: 150.0000 mg | ORAL_TABLET | Freq: Once | ORAL | 0 refills | Status: AC

## 2023-06-24 NOTE — Progress Notes (Signed)
 GYNECOLOGY  VISIT  CC:   vulvar itching, burning  HPI: 73 y.o. G39P2002 Married Other or two or more races female here for complaint of vaginal burning and itching that has been present for more than a week.  She went to urgent care last week.  She was tested for yeast and was told that was not present.  She was told to use OTC monistat.  She is on day #6 and does feel symptoms are much better.  As she didn't really have a good reason for what she was experiencing, she wanted to be seen.  She is having some burning with urination but this feel more like a skin irritation.  POCT today was negative.     Past Medical History:  Diagnosis Date   Brain tumor (benign) (HCC)    Fibromyalgia    Neuropathy    Pancreatitis    PONV (postoperative nausea and vomiting)    RA (rheumatoid arthritis) (HCC)    Zika virus disease 03/2014    MEDS:   Current Outpatient Medications on File Prior to Visit  Medication Sig Dispense Refill   acetaminophen (TYLENOL) 325 MG tablet Take 650 mg by mouth every 6 (six) hours as needed for moderate pain.     Calcium-Vitamin D 600-200 MG-UNIT per tablet Take 2 tablets by mouth daily.     Diclofenac Sodium 1 % CREA 4 gram qid as needed 120 g 11   doxycycline (VIBRAMYCIN) 100 MG capsule Take 100 mg by mouth 2 (two) times daily.     DULoxetine (CYMBALTA) 60 MG capsule Take 1 capsule (60 mg total) by mouth daily. 30 capsule 0   folic acid (FOLVITE) 1 MG tablet Take 1 tablet (1 mg total) by mouth daily. 30 tablet 0   gabapentin (NEURONTIN) 100 MG capsule Take 1 capsule (100 mg total) by mouth 3 (three) times daily as needed. 90 capsule 0   gabapentin (NEURONTIN) 600 MG tablet Take 1 tablet (600 mg total) by mouth at bedtime. 8pm 30 tablet 0   lactulose (CHRONULAC) 10 GM/15ML solution Take 1 tablespoon daily 473 mL 0   lidocaine-prilocaine (EMLA) cream 4 gram qid as needed 30 g 11   meloxicam (MOBIC) 15 MG tablet Take 1 tablet (15 mg total) by mouth daily as needed for  pain. 30 tablet 0   methocarbamol (ROBAXIN) 750 MG tablet Take 1 tablet (750 mg total) by mouth 4 (four) times daily. 60 tablet 2   methotrexate (RHEUMATREX) 2.5 MG tablet Take 6 tablets (15 mg total) by mouth once a week. Caution:Chemotherapy. Protect from light. Take on Sunday 24 tablet 0   pantoprazole (PROTONIX) 40 MG tablet Take 1 tablet (40 mg total) by mouth daily. 30 tablet 2   Polyethyl Glycol-Propyl Glycol (ULTRA LUBRICATING EYE DROPS) 0.4-0.3 % SOLN Apply 1 drop to eye daily as needed (Both eyes).     zinc gluconate 50 MG tablet Take 50 mg by mouth daily.     No current facility-administered medications on file prior to visit.    ALLERGIES: Patient has no known allergies.  SH:  married, non smoker  Review of Systems  Constitutional: Negative.   Genitourinary:        Vulvar burning/irritation    PHYSICAL EXAMINATION:    BP 136/81 (BP Location: Left Arm, Patient Position: Sitting, Cuff Size: Large)   Pulse 78   Ht 5' 6.25" (1.683 m)   Wt 206 lb 9.6 oz (93.7 kg)   LMP 02/16/2008   BMI 33.09 kg/m  General appearance: alert, cooperative and appears stated age  Lymph:  no inguinal LAD noted Pelvic: External genitalia:  a few small sebaceous cysts noted, erythema of labia majora, no discrete lesions or other skin changes              Urethra:  normal appearing urethra with no masses, tenderness or lesions              Bartholins and Skenes: normal                 Vagina:  normal appearing mucosa, white cream present           Assessment/Plan: 1. Vulvar burning (Primary) - pt aware only finding on exam is some erythema.  May be partially treated yeast vaginitis. - Cervicovaginal ancillary only( [place]) - fluconazole (DIFLUCAN) 150 MG tablet; Take 1 tablet (150 mg total) by mouth once for 1 dose. Repeat in 72 hours if needed.  Dispense: 2 tablet; Refill: 0  2. Dysuria - POCT Urinalysis Dipstick

## 2023-06-27 ENCOUNTER — Other Ambulatory Visit (HOSPITAL_BASED_OUTPATIENT_CLINIC_OR_DEPARTMENT_OTHER): Payer: Self-pay | Admitting: Obstetrics & Gynecology

## 2023-06-27 DIAGNOSIS — L292 Pruritus vulvae: Secondary | ICD-10-CM

## 2023-06-27 LAB — CERVICOVAGINAL ANCILLARY ONLY
Bacterial Vaginitis (gardnerella): NEGATIVE
Candida Glabrata: NEGATIVE
Candida Vaginitis: NEGATIVE
Comment: NEGATIVE
Comment: NEGATIVE
Comment: NEGATIVE

## 2023-06-27 MED ORDER — TRIAMCINOLONE ACETONIDE 0.5 % EX OINT: 1.0000 | TOPICAL_OINTMENT | Freq: Two times a day (BID) | CUTANEOUS | 0 refills | Status: AC

## 2023-06-29 ENCOUNTER — Encounter (HOSPITAL_BASED_OUTPATIENT_CLINIC_OR_DEPARTMENT_OTHER): Payer: Self-pay | Admitting: Obstetrics & Gynecology

## 2023-07-01 DIAGNOSIS — M545 Low back pain, unspecified: Secondary | ICD-10-CM | POA: Diagnosis not present

## 2023-07-01 DIAGNOSIS — M7918 Myalgia, other site: Secondary | ICD-10-CM | POA: Diagnosis not present

## 2023-07-11 ENCOUNTER — Ambulatory Visit (HOSPITAL_BASED_OUTPATIENT_CLINIC_OR_DEPARTMENT_OTHER): Admitting: Obstetrics & Gynecology

## 2023-07-13 DIAGNOSIS — M9903 Segmental and somatic dysfunction of lumbar region: Secondary | ICD-10-CM | POA: Diagnosis not present

## 2023-07-13 DIAGNOSIS — M542 Cervicalgia: Secondary | ICD-10-CM | POA: Diagnosis not present

## 2023-07-13 DIAGNOSIS — M9902 Segmental and somatic dysfunction of thoracic region: Secondary | ICD-10-CM | POA: Diagnosis not present

## 2023-07-13 DIAGNOSIS — M62830 Muscle spasm of back: Secondary | ICD-10-CM | POA: Diagnosis not present

## 2023-07-13 DIAGNOSIS — M9901 Segmental and somatic dysfunction of cervical region: Secondary | ICD-10-CM | POA: Diagnosis not present

## 2023-07-13 DIAGNOSIS — M546 Pain in thoracic spine: Secondary | ICD-10-CM | POA: Diagnosis not present

## 2023-07-29 ENCOUNTER — Encounter: Payer: Self-pay | Admitting: Adult Health

## 2023-07-29 ENCOUNTER — Ambulatory Visit (INDEPENDENT_AMBULATORY_CARE_PROVIDER_SITE_OTHER): Admitting: Adult Health

## 2023-07-29 VITALS — BP 124/70 | HR 72 | Ht 66.0 in | Wt 207.0 lb

## 2023-07-29 DIAGNOSIS — G629 Polyneuropathy, unspecified: Secondary | ICD-10-CM | POA: Diagnosis not present

## 2023-07-29 MED ORDER — GABAPENTIN 100 MG PO CAPS: 200.0000 mg | ORAL_CAPSULE | Freq: Every morning | ORAL | 11 refills | Status: DC

## 2023-07-29 MED ORDER — LIDOCAINE-PRILOCAINE 2.5-2.5 % EX CREA: TOPICAL_CREAM | CUTANEOUS | 11 refills | Status: AC

## 2023-07-29 MED ORDER — GABAPENTIN 300 MG PO CAPS: 300.0000 mg | ORAL_CAPSULE | Freq: Every day | ORAL | 3 refills | Status: DC
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
## 2023-07-29 NOTE — Patient Instructions (Signed)
 Your Plan:  Adjust gabapentin dosage - continue 300mg  nightly and add 100mg  in the morning, after 1 week can increase morning dose to 200mg  if needed   Continue duloxetine 60mg  daily  Try Elma cream as needed   Ensure good foot wear and use of orthotics - can look at places such as [employer] or the good feet store       Follow up in 6 months or call earlier if needed        Thank you for coming to see [place] at [facility]. I hope we have been able to provide you high quality care today.  You may receive a patient satisfaction survey over the next few weeks. We would appreciate your feedback and comments so that we may continue to improve ourselves and the health of our patients.

## 2023-08-03 ENCOUNTER — Other Ambulatory Visit: Payer: Self-pay | Admitting: Gastroenterology

## 2023-08-03 DIAGNOSIS — K5900 Constipation, unspecified: Secondary | ICD-10-CM

## 2023-08-17 ENCOUNTER — Other Ambulatory Visit: Payer: Self-pay | Admitting: Internal Medicine

## 2023-08-23 DIAGNOSIS — L738 Other specified follicular disorders: Secondary | ICD-10-CM | POA: Diagnosis not present

## 2023-08-23 DIAGNOSIS — L2989 Other pruritus: Secondary | ICD-10-CM | POA: Diagnosis not present

## 2023-08-27 ENCOUNTER — Other Ambulatory Visit: Payer: Self-pay | Admitting: Internal Medicine

## 2023-09-19 ENCOUNTER — Ambulatory Visit: Admitting: Adult Health

## 2023-09-26 ENCOUNTER — Other Ambulatory Visit (HOSPITAL_COMMUNITY): Payer: Self-pay | Admitting: Internal Medicine

## 2023-09-26 DIAGNOSIS — Z1231 Encounter for screening mammogram for malignant neoplasm of breast: Secondary | ICD-10-CM

## 2023-10-28 ENCOUNTER — Other Ambulatory Visit (HOSPITAL_COMMUNITY): Payer: Self-pay | Admitting: Internal Medicine

## 2023-10-28 ENCOUNTER — Encounter (HOSPITAL_COMMUNITY): Payer: Self-pay

## 2023-10-28 ENCOUNTER — Ambulatory Visit (HOSPITAL_COMMUNITY)
Admission: RE | Admit: 2023-10-28 | Discharge: 2023-10-28 | Disposition: A | Discharge status: Home / Self Care | Source: Ambulatory Visit | Attending: Internal Medicine | Admitting: Internal Medicine

## 2023-10-28 DIAGNOSIS — Z1231 Encounter for screening mammogram for malignant neoplasm of breast: Secondary | ICD-10-CM | POA: Diagnosis not present

## 2023-12-05 DIAGNOSIS — M0609 Rheumatoid arthritis without rheumatoid factor, multiple sites: Secondary | ICD-10-CM | POA: Diagnosis not present

## 2023-12-13 ENCOUNTER — Telehealth: Payer: Self-pay | Admitting: Adult Health

## 2023-12-13 NOTE — Telephone Encounter (Signed)
 Spoke w/Pt regarding ph message. Discussed the dosing of gabapentin  recommended at last visit 07/29/23 which was 100 mg in morning and 300 mg at bedtime, after 1 week increase am dose to 200 mg. Pt stated she has not been taking the gabapentin  that way and has been taking 300 mg during the day and none at night. Recommended Pt take 200 mg for morning dose and 300 mg for bedtime dose starting tomorrow and if Pt took 300 mg this morning take only 200 mg at bedtime tonight. Pt voiced understanding and thanks for clarifying medication dosage. Asked Pt to make us  aware by next week if this dosage is not helping. Pt voiced understanding.

## 2023-12-13 NOTE — Telephone Encounter (Signed)
 Pt called to to request to be seen sooner the patient states she is having problem with her feet and they are both numb at this times . Pt states she is feeling this burning sensation every time she walks . PT states that at first it was in one foot now  pt  states both her feet feels numbs  and not sure what to do . Pt is requesting to be seen sooner .

## 2023-12-14 DIAGNOSIS — M51369 Other intervertebral disc degeneration, lumbar region without mention of lumbar back pain or lower extremity pain: Secondary | ICD-10-CM | POA: Insufficient documentation

## 2023-12-14 DIAGNOSIS — M5410 Radiculopathy, site unspecified: Secondary | ICD-10-CM | POA: Insufficient documentation

## 2023-12-14 DIAGNOSIS — M1990 Unspecified osteoarthritis, unspecified site: Secondary | ICD-10-CM | POA: Insufficient documentation

## 2023-12-15 ENCOUNTER — Encounter: Payer: Self-pay | Admitting: Orthopedic Surgery

## 2023-12-15 ENCOUNTER — Other Ambulatory Visit (INDEPENDENT_AMBULATORY_CARE_PROVIDER_SITE_OTHER): Payer: Self-pay

## 2023-12-15 ENCOUNTER — Ambulatory Visit: Admitting: Orthopedic Surgery

## 2023-12-15 VITALS — BP 129/81 | HR 67 | Ht 66.0 in | Wt 209.0 lb

## 2023-12-15 DIAGNOSIS — M25551 Pain in right hip: Secondary | ICD-10-CM | POA: Diagnosis not present

## 2023-12-15 DIAGNOSIS — M25552 Pain in left hip: Secondary | ICD-10-CM

## 2023-12-15 DIAGNOSIS — M5136 Other intervertebral disc degeneration, lumbar region with discogenic back pain only: Secondary | ICD-10-CM | POA: Diagnosis not present

## 2023-12-15 NOTE — Addendum Note (Signed)
 Addended by: MARCINE HUSBAND T on: 12/15/2023 09:24 AM   Modules accepted: Orders

## 2023-12-15 NOTE — Progress Notes (Signed)
  Intake history:  BP 129/81   Pulse 67   Ht 5' 6 (1.676 m)   Wt 209 lb (94.8 kg)   LMP 02/16/2008   BMI 33.73 kg/m  Body mass index is 33.73 kg/m.    WHAT ARE WE SEEING YOU FOR TODAY?   back - lumbar/sacral  Radiation?: no.   Loss of bowel/urine control?  no, bilateral hip(s)  How long has this bothered you? (DOI?DOS?WS?)  approximately 3 month(s) ago after doing chair yoga  Was there an injury? No  Anticoag.  No  Diabetes No  Heart disease No  Hypertension No  SMOKING HX No  Kidney disease No  Any ALLERGIES ______________________________________________   Treatment:  Have you taken:  Tylenol  Yes  Advil  No  Had PT No  Had injection No  Other  Gabapentin_________________________

## 2023-12-15 NOTE — Patient Instructions (Signed)
 Follow-up after completing 4 weeks of physical therapy
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
## 2023-12-20 ENCOUNTER — Telehealth: Payer: Self-pay | Admitting: Orthopedic Surgery

## 2023-12-20 NOTE — Telephone Encounter (Signed)
 Dr. Areatha pt - pt lvm, I couldn't quite make out what she was needing.  I called her back, lvm w/her appt day/time and if she needed something different to call me back.

## 2023-12-28 DIAGNOSIS — M2560 Stiffness of unspecified joint, not elsewhere classified: Secondary | ICD-10-CM | POA: Diagnosis not present

## 2023-12-28 DIAGNOSIS — M6281 Muscle weakness (generalized): Secondary | ICD-10-CM | POA: Diagnosis not present

## 2023-12-28 DIAGNOSIS — M549 Dorsalgia, unspecified: Secondary | ICD-10-CM | POA: Diagnosis not present

## 2023-12-30 DIAGNOSIS — M6281 Muscle weakness (generalized): Secondary | ICD-10-CM | POA: Diagnosis not present

## 2023-12-30 DIAGNOSIS — M549 Dorsalgia, unspecified: Secondary | ICD-10-CM | POA: Diagnosis not present

## 2023-12-30 DIAGNOSIS — M2560 Stiffness of unspecified joint, not elsewhere classified: Secondary | ICD-10-CM | POA: Diagnosis not present

## 2024-01-03 DIAGNOSIS — M2560 Stiffness of unspecified joint, not elsewhere classified: Secondary | ICD-10-CM | POA: Diagnosis not present

## 2024-01-03 DIAGNOSIS — M549 Dorsalgia, unspecified: Secondary | ICD-10-CM | POA: Diagnosis not present

## 2024-01-03 DIAGNOSIS — M6281 Muscle weakness (generalized): Secondary | ICD-10-CM | POA: Diagnosis not present

## 2024-01-06 DIAGNOSIS — M2560 Stiffness of unspecified joint, not elsewhere classified: Secondary | ICD-10-CM | POA: Diagnosis not present

## 2024-01-06 DIAGNOSIS — M549 Dorsalgia, unspecified: Secondary | ICD-10-CM | POA: Diagnosis not present

## 2024-01-06 DIAGNOSIS — M6281 Muscle weakness (generalized): Secondary | ICD-10-CM | POA: Diagnosis not present

## 2024-01-09 DIAGNOSIS — M2560 Stiffness of unspecified joint, not elsewhere classified: Secondary | ICD-10-CM | POA: Diagnosis not present

## 2024-01-09 DIAGNOSIS — M549 Dorsalgia, unspecified: Secondary | ICD-10-CM | POA: Diagnosis not present

## 2024-01-09 DIAGNOSIS — M6281 Muscle weakness (generalized): Secondary | ICD-10-CM | POA: Diagnosis not present

## 2024-01-12 DIAGNOSIS — M6281 Muscle weakness (generalized): Secondary | ICD-10-CM | POA: Diagnosis not present

## 2024-01-12 DIAGNOSIS — M2560 Stiffness of unspecified joint, not elsewhere classified: Secondary | ICD-10-CM | POA: Diagnosis not present

## 2024-01-12 DIAGNOSIS — M549 Dorsalgia, unspecified: Secondary | ICD-10-CM | POA: Diagnosis not present

## 2024-01-17 DIAGNOSIS — M6281 Muscle weakness (generalized): Secondary | ICD-10-CM | POA: Diagnosis not present

## 2024-01-17 DIAGNOSIS — M549 Dorsalgia, unspecified: Secondary | ICD-10-CM | POA: Diagnosis not present

## 2024-01-17 DIAGNOSIS — M2560 Stiffness of unspecified joint, not elsewhere classified: Secondary | ICD-10-CM | POA: Diagnosis not present

## 2024-01-19 ENCOUNTER — Encounter: Payer: Self-pay | Admitting: Orthopedic Surgery

## 2024-01-19 ENCOUNTER — Ambulatory Visit: Admitting: Orthopedic Surgery

## 2024-01-19 DIAGNOSIS — M6281 Muscle weakness (generalized): Secondary | ICD-10-CM | POA: Diagnosis not present

## 2024-01-19 DIAGNOSIS — M5136 Other intervertebral disc degeneration, lumbar region with discogenic back pain only: Secondary | ICD-10-CM | POA: Diagnosis not present

## 2024-01-19 DIAGNOSIS — M5431 Sciatica, right side: Secondary | ICD-10-CM

## 2024-01-19 DIAGNOSIS — M25551 Pain in right hip: Secondary | ICD-10-CM

## 2024-01-19 DIAGNOSIS — M549 Dorsalgia, unspecified: Secondary | ICD-10-CM | POA: Diagnosis not present

## 2024-01-19 DIAGNOSIS — M2560 Stiffness of unspecified joint, not elsewhere classified: Secondary | ICD-10-CM | POA: Diagnosis not present

## 2024-01-19 DIAGNOSIS — M25552 Pain in left hip: Secondary | ICD-10-CM

## 2024-01-19 MED ORDER — METHOCARBAMOL 750 MG PO TABS: 750.0000 mg | ORAL_TABLET | Freq: Every day | ORAL | 2 refills | Status: DC

## 2024-01-19 NOTE — Progress Notes (Signed)
    01/19/2024   Chief Complaint  Patient presents with   Back Pain    Feels better sharp pinching pain is gone but still feels tight     No diagnosis found.  What pharmacy do you use ? _____[facility]______________________  DOI/DOS/ Date: ongoing  Improved

## 2024-01-19 NOTE — Progress Notes (Signed)
     01/19/2024   Chief Complaint  Patient presents with   Back Pain    Feels better sharp pinching pain is gone but still feels tight     Encounter Diagnoses  Name Primary?   Bilateral hip pain Yes   Degeneration of intervertebral disc of lumbar region with discogenic back pain     What pharmacy do you use ? _____[facility]______________________  DOI/DOS/ Date: ongoing  Improved  Whitney Moreno has improved in terms of the pain she was having in her lower back just complains of more stiffness  She has 2 more weeks of physical therapy to complete which is recommended  We will add a muscle relaxer and we recommended heat and continued stretching  Meds ordered this encounter  Medications   methocarbamol  (ROBAXIN ) 750 MG tablet    Sig: Take 1 tablet (750 mg total) by mouth at bedtime.    Dispense:  60 tablet    Refill:  2

## 2024-01-27 DIAGNOSIS — M549 Dorsalgia, unspecified: Secondary | ICD-10-CM | POA: Diagnosis not present

## 2024-01-27 DIAGNOSIS — M6281 Muscle weakness (generalized): Secondary | ICD-10-CM | POA: Diagnosis not present

## 2024-01-27 DIAGNOSIS — M2560 Stiffness of unspecified joint, not elsewhere classified: Secondary | ICD-10-CM | POA: Diagnosis not present

## 2024-02-01 DIAGNOSIS — L281 Prurigo nodularis: Secondary | ICD-10-CM | POA: Diagnosis not present

## 2024-02-01 DIAGNOSIS — D485 Neoplasm of uncertain behavior of skin: Secondary | ICD-10-CM | POA: Diagnosis not present

## 2024-02-01 DIAGNOSIS — L720 Epidermal cyst: Secondary | ICD-10-CM | POA: Diagnosis not present

## 2024-02-01 DIAGNOSIS — D23112 Other benign neoplasm of skin of right lower eyelid, including canthus: Secondary | ICD-10-CM | POA: Diagnosis not present

## 2024-02-06 ENCOUNTER — Encounter: Payer: Self-pay | Admitting: Adult Health

## 2024-02-06 ENCOUNTER — Ambulatory Visit: Admitting: Adult Health

## 2024-02-06 VITALS — BP 127/75 | HR 77 | Ht 66.0 in | Wt 212.0 lb

## 2024-02-06 DIAGNOSIS — G629 Polyneuropathy, unspecified: Secondary | ICD-10-CM

## 2024-02-06 DIAGNOSIS — K117 Disturbances of salivary secretion: Secondary | ICD-10-CM | POA: Diagnosis not present

## 2024-02-06 MED ORDER — GABAPENTIN 300 MG PO CAPS: 300.0000 mg | ORAL_CAPSULE | Freq: Two times a day (BID) | ORAL | 3 refills | Status: AC

## 2024-02-06 NOTE — Patient Instructions (Addendum)
 Your Plan:  Continue gabapentin  300mg  twice daily - please call with worsening symptoms  Please let us  know if your drooling should start to worsen - can consider evaluation by speech therapy     Follow up with Dr. Onita in 6 months or call earlier if needed     Thank you for coming to see us  at [facility]. I hope we have been able to provide you high quality care today.  You may receive a patient satisfaction survey over the next few weeks. We would appreciate your feedback and comments so that we may continue to improve ourselves and the health of our patients.
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
## 2024-08-06 ENCOUNTER — Ambulatory Visit: Admitting: Neurology
